# Patient Record
Sex: Male | Born: 1965 | Race: White | Hispanic: No | Smoking: Current every day smoker
Health system: Southern US, Community
[De-identification: ages and names within clinical notes are randomized; demographics above are authoritative.]

## PROBLEM LIST (undated history)

## (undated) DIAGNOSIS — G629 Polyneuropathy, unspecified: Secondary | ICD-10-CM

## (undated) DIAGNOSIS — K219 Gastro-esophageal reflux disease without esophagitis: Secondary | ICD-10-CM

## (undated) DIAGNOSIS — D649 Anemia, unspecified: Secondary | ICD-10-CM

## (undated) DIAGNOSIS — F1020 Alcohol dependence, uncomplicated: Secondary | ICD-10-CM

## (undated) DIAGNOSIS — E119 Type 2 diabetes mellitus without complications: Secondary | ICD-10-CM

## (undated) DIAGNOSIS — F419 Anxiety disorder, unspecified: Secondary | ICD-10-CM

## (undated) DIAGNOSIS — I10 Essential (primary) hypertension: Secondary | ICD-10-CM

## (undated) HISTORY — DX: Alcohol dependence, uncomplicated: F10.20

## (undated) HISTORY — DX: Essential (primary) hypertension: I10

## (undated) HISTORY — PX: ANKLE SURGERY: SHX546

## (undated) HISTORY — DX: Gastro-esophageal reflux disease without esophagitis: K21.9

## (undated) HISTORY — DX: Anxiety disorder, unspecified: F41.9

## (undated) HISTORY — DX: Anemia, unspecified: D64.9

---

## 2006-07-26 ENCOUNTER — Emergency Department: Payer: Self-pay | Admitting: Emergency Medicine

## 2012-08-04 DIAGNOSIS — W230XXA Caught, crushed, jammed, or pinched between moving objects, initial encounter: Secondary | ICD-10-CM | POA: Insufficient documentation

## 2013-03-16 DIAGNOSIS — M25329 Other instability, unspecified elbow: Secondary | ICD-10-CM | POA: Insufficient documentation

## 2017-09-04 DIAGNOSIS — Z9889 Other specified postprocedural states: Secondary | ICD-10-CM | POA: Insufficient documentation

## 2018-08-12 ENCOUNTER — Emergency Department
Admission: EM | Admit: 2018-08-12 | Discharge: 2018-08-12 | Disposition: A | Discharge status: Home / Self Care | Payer: Medicare Other | Attending: Emergency Medicine | Admitting: Emergency Medicine

## 2018-08-12 ENCOUNTER — Emergency Department: Payer: Medicare Other

## 2018-08-12 ENCOUNTER — Encounter: Payer: Self-pay | Admitting: *Deleted

## 2018-08-12 DIAGNOSIS — E119 Type 2 diabetes mellitus without complications: Secondary | ICD-10-CM | POA: Diagnosis not present

## 2018-08-12 DIAGNOSIS — R079 Chest pain, unspecified: Secondary | ICD-10-CM | POA: Insufficient documentation

## 2018-08-12 DIAGNOSIS — F17210 Nicotine dependence, cigarettes, uncomplicated: Secondary | ICD-10-CM | POA: Diagnosis not present

## 2018-08-12 HISTORY — DX: Type 2 diabetes mellitus without complications: E11.9

## 2018-08-12 HISTORY — DX: Polyneuropathy, unspecified: G62.9

## 2018-08-12 LAB — BASIC METABOLIC PANEL
Anion gap: 10 (ref 5–15)
BUN: 8 mg/dL (ref 6–20)
CHLORIDE: 97 mmol/L — AB (ref 98–111)
CO2: 27 mmol/L (ref 22–32)
Calcium: 9.6 mg/dL (ref 8.9–10.3)
Creatinine, Ser: 0.93 mg/dL (ref 0.61–1.24)
GFR calc Af Amer: >60 mL/min (ref >60)
GFR calc non Af Amer: >60 mL/min (ref >60)
Glucose, Bld: 225 mg/dL — ABNORMAL HIGH (ref 70–99)
Potassium: 4.7 mmol/L (ref 3.5–5.1)
Sodium: 134 mmol/L — ABNORMAL LOW (ref 135–145)

## 2018-08-12 LAB — CBC
HEMATOCRIT: 47.3 % (ref 39.0–52.0)
Hemoglobin: 16.1 g/dL (ref 13.0–17.0)
MCH: 33 pg (ref 26.0–34.0)
MCHC: 34 g/dL (ref 30.0–36.0)
MCV: 96.9 fL (ref 80.0–100.0)
Platelets: 223 10*3/uL (ref 150–400)
RBC: 4.88 MIL/uL (ref 4.22–5.81)
RDW: 12.8 % (ref 11.5–15.5)
WBC: 8.2 10*3/uL (ref 4.0–10.5)
nRBC: 0 % (ref 0.0–0.2)

## 2018-08-12 LAB — TROPONIN I: Troponin I: <0.03 ng/mL (ref <0.03)

## 2018-08-12 MED ORDER — CHLORDIAZEPOXIDE HCL 25 MG PO CAPS: ORAL_CAPSULE | ORAL | 0 refills | Status: DC

## 2018-08-12 MED ORDER — CHLORDIAZEPOXIDE HCL 25 MG PO CAPS
25.0000 mg | ORAL_CAPSULE | Freq: Once | ORAL | Status: AC
  Administered 2018-08-12: 25 mg via ORAL
  Filled 2018-08-12: qty 1

## 2018-08-12 MED ORDER — SODIUM CHLORIDE 0.9% FLUSH: 3.0000 mL | Freq: Once | INTRAVENOUS | Status: DC

## 2018-08-12 NOTE — ED Notes (Signed)
IV removed from Left ac prior to d/c, hemostasis obtained and, site is clean dry and intact.

## 2018-08-12 NOTE — ED Provider Notes (Signed)
[facility] Emergency Department Provider Note       Time seen: ----------------------------------------- 7:46 PM on 08/12/2018 -----------------------------------------   I have reviewed the triage vital signs and the nursing notes.  HISTORY   Chief Complaint Chest Pain    HPI Alex Green is a 53 y.o. male with a history of diabetes and neuropathy who presents to the ED for pain or shortness of breath with anxiety which has been occurring for the past 9 to 10 days.  Patient reports feeling anxious and pacing at night.  Patient states his been feeling short of breath and having chest pain and this makes him very anxious causing him to paced in his house at night.  Past Medical History:  Diagnosis Date  . Diabetes mellitus without complication (HCC)   . Neuropathy     There are no active problems to display for this patient.   History reviewed. No pertinent surgical history.  Allergies Other  Social History Social History   Tobacco Use  . Smoking status: Current Every Day Smoker    Packs/day: 1.50  . Smokeless tobacco: Never Used  Substance Use Topics  . Alcohol use: Never    Frequency: Never  . Drug use: Never   Review of Systems Constitutional: Negative for fever. Cardiovascular: Positive for chest pain Respiratory: Positive for shortness of breath Gastrointestinal: Negative for abdominal pain, vomiting and diarrhea. Musculoskeletal: Negative for back pain. Skin: Negative for rash. Neurological: Negative for headaches, focal weakness or numbness. Psychiatric: Positive for anxiety  All systems negative/normal/unremarkable except as stated in the HPI  ____________________________________________   PHYSICAL EXAM:  VITAL SIGNS: ED Triage Vitals [08/12/18 1838]  Enc Vitals Group     BP (!) 150/90     Pulse Rate 78     Resp 16     Temp 98.6 F (37 C)     Temp Source Oral     SpO2 100 %     Weight 185 lb (83.9 kg)   Height      Head Circumference      Peak Flow      Pain Score 2     Pain Loc      Pain Edu?      Excl. in GC?    Constitutional: Alert and oriented. Well appearing and in no distress. Eyes: Conjunctivae are normal. Normal extraocular movements. Cardiovascular: Normal rate, regular rhythm. No murmurs, rubs, or gallops. Respiratory: Normal respiratory effort without tachypnea nor retractions. Breath sounds are clear and equal bilaterally. No wheezes/rales/rhonchi. Gastrointestinal: Soft and nontender. Normal bowel sounds Musculoskeletal: Nontender with normal range of motion in extremities. No lower extremity tenderness nor edema. Neurologic:  Normal speech and language. No gross focal neurologic deficits are appreciated.  Mild tremors noted Skin:  Skin is warm, dry and intact. No rash noted. Psychiatric: Mood and affect are normal. Speech and behavior are normal.  ____________________________________________  EKG: Interpreted by me.  Sinus rhythm rate 87 bpm, normal PR interval, normal QRS, normal QT  ____________________________________________  ED COURSE:  As part of my medical decision making, I reviewed the following data within the electronic MEDICAL RECORD NUMBER History obtained from family if available, nursing notes, old chart and ekg, as well as notes from prior ED visits. Patient presented for nonspecific chest pain, we will assess with labs and imaging as indicated at this time.   Procedures ____________________________________________   LABS (pertinent positives/negatives)  Labs Reviewed  BASIC METABOLIC PANEL - Abnormal; Notable for the following  components:      Result Value   Sodium 134 (*)    Chloride 97 (*)    Glucose, Bld 225 (*)    All other components within normal limits  CBC  TROPONIN I    RADIOLOGY Images were viewed by me  Chest x-ray is unremarkable  ____________________________________________   DIFFERENTIAL DIAGNOSIS   Alcohol withdrawal,  dehydration, electrolyte abnormality, anxiety, unstable angina unlikely  FINAL ASSESSMENT AND PLAN  Chest pain  Plan: The patient had presented for chest pain. Patient's labs are reassuring. Patient's imaging not reveal any acute process.  Currently he is tremulous which I think is alcohol withdrawal related.  I will place him on Librium taper to discontinue alcohol.  He is cleared for outpatient follow-up.   Ulice Dash, MD    Note: This note was generated in part or whole with voice recognition software. Voice recognition is usually quite accurate but there are transcription errors that can and very often do occur. I apologize for any typographical errors that were not detected and corrected.     Emily Filbert, MD 08/12/18 2013

## 2018-08-12 NOTE — ED Notes (Addendum)
Pt verbalized understanding of d/c instructions, rx, and f/u care. No further questions at this time. Pt ambulatory to exit with steady gait. E-signature no working at this time. Paper signature obtained.

## 2018-08-12 NOTE — ED Triage Notes (Signed)
Pt is here for CP and sob and anxiety which has been occurring for 9-10 days.  Pt reports feeling anxious and pacing at night.  Pt states that he has been feeling sob and having CP and these feelings have been making him very anxious causing him to pace his house at night.

## 2018-08-12 NOTE — ED Notes (Signed)
Pt placed on cardiac monitoring. Assessment completed. MD at bedside. Call bell within reach.

## 2019-11-03 ENCOUNTER — Other Ambulatory Visit: Payer: Self-pay | Admitting: Physician Assistant

## 2019-11-03 DIAGNOSIS — S9032XA Contusion of left foot, initial encounter: Secondary | ICD-10-CM

## 2020-02-23 ENCOUNTER — Ambulatory Visit: Payer: Self-pay | Admitting: Family Medicine

## 2020-04-13 LAB — BASIC METABOLIC PANEL
BUN: 3 — AB (ref 4–21)
Creatinine: 0.7 (ref 0.6–1.3)
Glucose: 152
Potassium: 4.9 (ref 3.4–5.3)
Sodium: 134 — AB (ref 137–147)

## 2020-04-13 LAB — HEPATIC FUNCTION PANEL
ALT: 36 (ref 10–40)
AST: 53 — AB (ref 14–40)
Bilirubin, Total: 0.6

## 2020-04-13 LAB — HEMOGLOBIN A1C: Hemoglobin A1C: 7

## 2020-04-21 ENCOUNTER — Ambulatory Visit (INDEPENDENT_AMBULATORY_CARE_PROVIDER_SITE_OTHER): Payer: Self-pay | Admitting: Family Medicine

## 2020-04-21 ENCOUNTER — Encounter: Payer: Self-pay | Admitting: Family Medicine

## 2020-04-21 ENCOUNTER — Other Ambulatory Visit: Payer: Self-pay

## 2020-04-21 VITALS — BP 128/84 | HR 88 | Temp 97.5°F | Ht 69.0 in | Wt 175.0 lb

## 2020-04-21 DIAGNOSIS — E1140 Type 2 diabetes mellitus with diabetic neuropathy, unspecified: Secondary | ICD-10-CM | POA: Insufficient documentation

## 2020-04-21 DIAGNOSIS — F1021 Alcohol dependence, in remission: Secondary | ICD-10-CM | POA: Insufficient documentation

## 2020-04-21 DIAGNOSIS — F331 Major depressive disorder, recurrent, moderate: Secondary | ICD-10-CM

## 2020-04-21 DIAGNOSIS — Z720 Tobacco use: Secondary | ICD-10-CM

## 2020-04-21 DIAGNOSIS — I10 Essential (primary) hypertension: Secondary | ICD-10-CM

## 2020-04-21 DIAGNOSIS — K219 Gastro-esophageal reflux disease without esophagitis: Secondary | ICD-10-CM | POA: Insufficient documentation

## 2020-04-21 DIAGNOSIS — Z7689 Persons encountering health services in other specified circumstances: Secondary | ICD-10-CM

## 2020-04-21 DIAGNOSIS — F1020 Alcohol dependence, uncomplicated: Secondary | ICD-10-CM | POA: Insufficient documentation

## 2020-04-21 DIAGNOSIS — F411 Generalized anxiety disorder: Secondary | ICD-10-CM | POA: Insufficient documentation

## 2020-04-21 NOTE — Patient Instructions (Addendum)
Thank you for coming to the office today.  Recent Labs    04/13/20 0000  HGBA1C 7.0    We need a copy of the Diabetic Eye report.  Your provider would like to you have your annual eye exam. Please contact your current eye doctor or here are some good options for you to contact.   [facility]   Address: [address] [place], [place] [address] Phone: [phone]  Website: [email or web address]   [facility] [address], [place], [place] [address] Phone: [phone] [email or web address]  [facility]  Address: [address] [place], [place], [place] [address] Phone: [phone]   [facility] [address] [place], [place] [address] Phone: [phone]  [facility] Address: [address] [place], [place], [place] [address]  Phone: [phone]  DUE for FASTING BLOOD WORK (no food or drink after midnight before the lab appointment, only water or coffee without cream/sugar on the morning of)   Please schedule a Follow-up Appointment to: Return in about 3 months (around 07/22/2020) for 3 month follow-up DM, HTN, neuro, meds, fasting lab BEFORE appointment same day..  If you have any other questions or concerns, please feel free to call the office or send a message through MyChart. You may also schedule an earlier appointment if necessary.  Additionally, you may be receiving a survey about your experience at our office within a few days to 1 week by e-mail or mail. We value your feedback.  Saralyn Pilar, DO [facility], [place]

## 2020-04-21 NOTE — Progress Notes (Signed)
Subjective:    Patient ID: Alex Green, male    DOB: 07/25/1965, 54 y.o.   MRN: [id number]  Alex Green is a 54 y.o. male presenting on 04/21/2020 for Establish Care (DM, feet numbness)  Here to establish care. New patient. Previous PCP at [facility] [place]. Provider: Unknown [facility]oley PA  HPI   CHRONIC DM, Type 2, with neuropathy Reports history of initial dx DM >6 years ago, with A1c 12.1 in past. CBGs: Meter was working until now difficulty with it Meds: Trulicity 0.75mg  weekly - Off Metformin and Amaryl Reports good compliance. Tolerating well w/o side-effects Currently on ARB  Lifestyle: - Diet (improving) Exercise (limited due to neuropathy) - Admits chronic neuropathy - he takes Lyrica 150mg  4 times daily. He is unable to maintain balance. He has upcoming Neurological apt coming up. Admits dizziness. Eye doctor is in , but he cannot travel back, needs new local Denies hypoglycemia, polyuria, visual changes, numbness or tingling.  Major Depression, recurrent  Generalized Anxiety Disorder Insomnia Chronic problem with some mixed depression and anxiety. 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 He did sleep study.  GERD He feels choked at times. He takes Protonix once daily that helps.  CHRONIC HTN: Reports not checking BP at home. Current Meds - Valsartan (Diovan) 80mg  daily   Reports good compliance, took meds today. Tolerating well, w/o complaints. Denies CP, dyspnea, HA, edema, dizziness / lightheadedness  Muscle Atrophy / Poor Appetite.   PMH - Left elbow injury, caught in machine. He has retired.    Depression screen PHQ 2/9 04/21/2020  Decreased Interest 2  Down, Depressed, Hopeless 2  PHQ - 2 Score 4  Altered sleeping 1  Tired, decreased energy 3  Change in  appetite 3  Feeling bad or failure about yourself  1  Trouble concentrating 2  Moving slowly or fidgety/restless 0  Suicidal thoughts 0  PHQ-9 Score 14  Difficult doing work/chores Somewhat difficult     GAD 7 : Generalized Anxiety Score 04/21/2020  Nervous, Anxious, on Edge 2  Control/stop worrying 0  Worry too much - different things 0  Trouble relaxing 0  Restless 0  Easily annoyed or irritable 1  Afraid - awful might happen 1  Total GAD 7 Score 4  Anxiety Difficulty Somewhat difficult      Past Medical History:  Diagnosis Date  . Alcoholism (HCC)   . Anemia   . Anxiety   . GERD (gastroesophageal reflux disease)   . Hypertension    Past Surgical History:  Procedure Laterality Date  . ANKLE SURGERY     Social History   Socioeconomic History  . Marital status: Divorced    Spouse name: Not on file  . Number of children: Not on file  . Years of education: Not on file  . Highest education level: Not on file  Occupational History  . Not on file  Tobacco Use  . Smoking status: Current Every Day Smoker    Packs/day: 1.50    Years: 10.00    Pack years: 15.00    Types: Cigarettes  . Smokeless tobacco: Never Used  Substance and Sexual Activity  . Alcohol use: Yes    Alcohol/week: 50.0 standard drinks    Types: 50 Cans of beer per week  . Drug use: Not Currently  .  Sexual activity: Not on file  Other Topics Concern  . Not on file  Social History Narrative  . Not on file   Social Determinants of Health   Financial Resource Strain:   . Difficulty of Paying Living Expenses: Not on file  Food Insecurity:   . Worried About Programme researcher, broadcasting/film/video in the Last Year: Not on file  . Ran Out of Food in the Last Year: Not on file  Transportation Needs:   . Lack of Transportation (Medical): Not on file  . Lack of Transportation (Non-Medical): Not on file  Physical Activity:   . Days of Exercise per Week: Not on file  . Minutes of Exercise per Session: Not on file   Stress:   . Feeling of Stress : Not on file  Social Connections:   . Frequency of Communication with Friends and Family: Not on file  . Frequency of Social Gatherings with Friends and Family: Not on file  . Attends Religious Services: Not on file  . Active Member of Clubs or Organizations: Not on file  . Attends Banker Meetings: Not on file  . Marital Status: Not on file  Intimate Partner Violence:   . Fear of Current or Ex-Partner: Not on file  . Emotionally Abused: Not on file  . Physically Abused: Not on file  . Sexually Abused: Not on file   History reviewed. No pertinent family history. Current Outpatient Medications on File Prior to Visit  Medication Sig  . Dulaglutide (TRULICITY) 0.75 MG/0.5ML SOPN Inject into the skin.  . DULoxetine (CYMBALTA) 60 MG capsule Take 60 mg by mouth daily.  . pregabalin (LYRICA) 150 MG capsule Take 150 mg by mouth in the morning, at noon, in the evening, and at bedtime.  . valsartan (DIOVAN) 80 MG tablet Take 80 mg by mouth daily.  Marland Kitchen zolpidem (AMBIEN CR) 12.5 MG CR tablet Take 12.5 mg by mouth at bedtime as needed for sleep.   No current facility-administered medications on file prior to visit.    Review of Systems Per HPI unless specifically indicated above      Objective:    BP 128/84 (BP Location: Left Arm, Cuff Size: Normal)   Pulse 88   Temp (!) 97.5 F (36.4 C) (Temporal)   Ht 5\' 9"  (1.753 m)   Wt 175 lb (79.4 kg)   SpO2 100%   BMI 25.84 kg/m   Wt Readings from Last 3 Encounters:  04/21/20 175 lb (79.4 kg)    Physical Exam Vitals and nursing note reviewed.  Constitutional:      General: He is not in acute distress.    Appearance: He is well-developed. He is obese. He is not diaphoretic.     Comments: Well-appearing, comfortable, cooperative  HENT:     Head: Normocephalic and atraumatic.  Eyes:     General:        Right eye: No discharge.        Left eye: No discharge.     Conjunctiva/sclera:  Conjunctivae normal.  Cardiovascular:     Rate and Rhythm: Normal rate.  Pulmonary:     Effort: Pulmonary effort is normal.  Skin:    General: Skin is warm and dry.     Findings: No erythema or rash.  Neurological:     Mental Status: He is alert and oriented to person, place, and time.  Psychiatric:        Behavior: Behavior normal.     Comments: Well groomed, good  eye contact, normal speech and thoughts       Results for orders placed or performed in visit on 04/21/20  Basic metabolic panel  Result Value Ref Range   Glucose 152    BUN 3 (A) 4 - 21   Creatinine 0.7 0.6 - 1.3   Potassium 4.9 3.4 - 5.3   Sodium 134 (A) 137 - 147  Hepatic function panel  Result Value Ref Range   ALT 36 10 - 40   AST 53 (A) 14 - 40   Bilirubin, Total 0.6   Hemoglobin A1c  Result Value Ref Range   Hemoglobin A1C 7.0       Assessment & Plan:   Problem List Items Addressed This Visit    Tobacco abuse   GERD (gastroesophageal reflux disease)   GAD (generalized anxiety disorder)    Controlled on Duloxetine      Relevant Medications   DULoxetine (CYMBALTA) 60 MG capsule   Essential hypertension    Well-controlled HTN - Home BP readings     Plan:  1. Continue current BP regimen Valsartan 80mg  daily 2. Encourage improved lifestyle - low sodium diet, regular exercise 3. Start monitor BP outside office, bring readings to next visit, if persistently >140/90 or new symptoms notify office sooner      Relevant Medications   valsartan (DIOVAN) 80 MG tablet   Controlled type 2 diabetes mellitus with neuropathy (HCC) - Primary    Well-controlled DM with A1c 7 on last lab Complications - peripheral neuropathy, hyperlipidemia, GERD, depression, obesity-  increases risk of future cardiovascular complications   Plan:  1. Continue current therapy - Trulicity 0.75mg  weekly, 2. Encourage improved lifestyle - low carb, low sugar diet, reduce portion size, continue improving regular exercise 3.  Check CBG , bring log to next visit for review 4. Continue ARB, future consider statin 5. DM Foot exam done today . Advised to schedule DM ophtho exam, send record       Relevant Medications   valsartan (DIOVAN) 80 MG tablet   Dulaglutide (TRULICITY) 0.75 MG/0.5ML SOPN   Alcohol dependence (HCC)    Stable chronic problem without symptoms of withdrawal Never history of complicated withdrawal or hospitalization Counseling on cessation       Other Visit Diagnoses    Major depressive disorder, recurrent, moderate (HCC)       Relevant Medications   DULoxetine (CYMBALTA) 60 MG capsule   Encounter to establish care with new doctor          No orders of the defined types were placed in this encounter.    Follow up plan: Return in about 3 months (around 07/22/2020) for 3 month follow-up DM, HTN, neuro, meds, fasting lab BEFORE appointment same day..   07/31/20 labs ordered before visit  08/02/20, DO [facility] 04/21/2020, 3:22 PM

## 2020-04-22 ENCOUNTER — Other Ambulatory Visit: Payer: Self-pay | Admitting: Family Medicine

## 2020-04-22 DIAGNOSIS — I10 Essential (primary) hypertension: Secondary | ICD-10-CM

## 2020-04-22 DIAGNOSIS — E1140 Type 2 diabetes mellitus with diabetic neuropathy, unspecified: Secondary | ICD-10-CM

## 2020-04-22 DIAGNOSIS — F331 Major depressive disorder, recurrent, moderate: Secondary | ICD-10-CM

## 2020-04-22 NOTE — Assessment & Plan Note (Signed)
Well-controlled HTN - Home BP readings     Plan:  1. Continue current BP regimen Valsartan 80mg  daily 2. Encourage improved lifestyle - low sodium diet, regular exercise 3. Start monitor BP outside office, bring readings to next visit, if persistently >140/90 or new symptoms notify office sooner

## 2020-04-22 NOTE — Assessment & Plan Note (Signed)
Stable chronic problem without symptoms of withdrawal Never history of complicated withdrawal or hospitalization Counseling on cessation

## 2020-04-22 NOTE — Assessment & Plan Note (Signed)
Controlled on Duloxetine 

## 2020-04-22 NOTE — Assessment & Plan Note (Signed)
Well-controlled DM with A1c 7 on last lab Complications - peripheral neuropathy, hyperlipidemia, GERD, depression, obesity-  increases risk of future cardiovascular complications   Plan:  1. Continue current therapy - Trulicity 0.75mg  weekly, 2. Encourage improved lifestyle - low carb, low sugar diet, reduce portion size, continue improving regular exercise 3. Check CBG , bring log to next visit for review 4. Continue ARB, future consider statin 5. DM Foot exam done today . Advised to schedule DM ophtho exam, send record

## 2020-06-24 DIAGNOSIS — E1142 Type 2 diabetes mellitus with diabetic polyneuropathy: Secondary | ICD-10-CM | POA: Insufficient documentation

## 2020-07-31 ENCOUNTER — Encounter: Payer: Self-pay | Admitting: Family Medicine

## 2020-07-31 ENCOUNTER — Other Ambulatory Visit: Payer: Self-pay

## 2020-07-31 ENCOUNTER — Ambulatory Visit (INDEPENDENT_AMBULATORY_CARE_PROVIDER_SITE_OTHER): Payer: Medicare Other | Admitting: Family Medicine

## 2020-07-31 VITALS — BP 80/54 | HR 101 | Ht 68.0 in | Wt 176.0 lb

## 2020-07-31 DIAGNOSIS — E1140 Type 2 diabetes mellitus with diabetic neuropathy, unspecified: Secondary | ICD-10-CM

## 2020-07-31 DIAGNOSIS — F411 Generalized anxiety disorder: Secondary | ICD-10-CM | POA: Diagnosis not present

## 2020-07-31 DIAGNOSIS — E785 Hyperlipidemia, unspecified: Secondary | ICD-10-CM | POA: Insufficient documentation

## 2020-07-31 DIAGNOSIS — E1169 Type 2 diabetes mellitus with other specified complication: Secondary | ICD-10-CM

## 2020-07-31 DIAGNOSIS — I10 Essential (primary) hypertension: Secondary | ICD-10-CM | POA: Diagnosis not present

## 2020-07-31 DIAGNOSIS — M6281 Muscle weakness (generalized): Secondary | ICD-10-CM | POA: Insufficient documentation

## 2020-07-31 MED ORDER — TRULICITY 1.5 MG/0.5ML ~~LOC~~ SOAJ: 1.5000 mg | SUBCUTANEOUS | 0 refills | Status: DC

## 2020-07-31 MED ORDER — DULOXETINE HCL 60 MG PO CPEP: 60.0000 mg | ORAL_CAPSULE | Freq: Every day | ORAL | 1 refills | Status: DC

## 2020-07-31 MED ORDER — ZOLPIDEM TARTRATE ER 12.5 MG PO TBCR: 12.5000 mg | EXTENDED_RELEASE_TABLET | Freq: Every evening | ORAL | 1 refills | Status: DC | PRN

## 2020-07-31 MED ORDER — PREGABALIN 150 MG PO CAPS: 150.0000 mg | ORAL_CAPSULE | Freq: Four times a day (QID) | ORAL | 1 refills | Status: DC

## 2020-07-31 MED ORDER — VALSARTAN 80 MG PO TABS: 80.0000 mg | ORAL_TABLET | Freq: Every day | ORAL | 1 refills | Status: DC

## 2020-07-31 NOTE — Patient Instructions (Addendum)
Thank you for coming to the office today.  Trial on higher dose Trulicity 1.5 mg weekly - 2 sample pens, call us when ready for me to order this through [facility].  Your provider would like to you have your annual eye exam. Please contact your current eye doctor or here are some good options for you to contact.   [facility]   Address: [address] [place], [place] [address] Phone: [phone]  Website: [email or web address]   [facility] [address] [place], [place] [address] Phone: [phone] [email or web address]  [facility]  Address: [address] [place], [place], [place] [address] Phone: [phone]   [facility] [address] [place], [place] [address] Phone: [phone]  [facility] Address: [address] [place], [place], [place] [address]  Phone: [phone]  Please schedule a Follow-up Appointment to: Return in about 6 months (around 01/28/2021) for 6 month DM A1c med refills.  If you have any other questions or concerns, please feel free to call the office or send a message through MyChart. You may also schedule an earlier appointment if necessary.  Additionally, you may be receiving a survey about your experience at our office within a few days to 1 week by e-mail or mail. We value your feedback.  Saralyn Pilar, DO [facility], [place]

## 2020-07-31 NOTE — Assessment & Plan Note (Signed)
Well-controlled DM previous, need updated A1c today on lab Complications - peripheral neuropathy, hyperlipidemia, GERD, depression, obesity-  increases risk of future cardiovascular complications   Plan:  1. TRIAL on free sample higher dose Trulicity 1.5mg  weekly x 2 pen = 2 week dosage, he can notify us when / if ready to pursue 1.5mg  dosing or if we will continue 0.75mg  - will need new rx order for either dose when ready 2. Encourage improved lifestyle - low carb, low sugar diet, reduce portion size, continue improving regular exercise 3. Check CBG , bring log to next visit for review 4. Continue ARB, future consider statin 5. DM Foot exam done today . Advised to schedule DM ophtho exam, send record

## 2020-07-31 NOTE — Assessment & Plan Note (Signed)
Well-controlled HTN - mild low BP today, asymptomatic - Home BP readings      Plan:  1. Continue current BP regimen Valsartan 80mg  daily 2. Encourage improved lifestyle - low sodium diet, regular exercise 3. Again advised to start monitor BP outside office, bring readings to next visit, if persistently >140/90 or new symptoms notify office sooner

## 2020-07-31 NOTE — Assessment & Plan Note (Signed)
Check fasting lipid panel today. 

## 2020-07-31 NOTE — Assessment & Plan Note (Signed)
Chronic problem Also issues with balance, complication as well with neuropathy  Referral to [facility] handwritten order given today

## 2020-07-31 NOTE — Progress Notes (Signed)
Subjective:    Patient ID: Alex Green, male    DOB: 03/12/1966, 55 y.o.   MRN: [id number]  Alex Green is a 55 y.o. male presenting on 07/31/2020 for Diabetes, Hypertension, and Balance issues   HPI  CHRONIC DM, Type 2, with neuropathy Reports history of initial dx DM >6 years ago, with A1c 12.1 in past. Now readings improved, awaiting on A1c result from today now. CBGs: Has glucometer again, no detailed report today. Meds: Trulicity 0.75mg  weekly, has not tried higher dose 1.5 - Off Metformin and Amaryl Reports good compliance. Tolerating well w/o side-effects Currently on ARB  Lifestyle: - Diet (improving) Exercise (limited due to neuropathy) - Admits chronic neuropathy - he takes Lyrica 150mg  4 times daily. He is unable to maintain balance. He has upcoming Neurological apt coming up. Admits dizziness. Eye doctor is in , but he cannot travel back, needs new local Denies hypoglycemia, polyuria, visual changes, numbness or tingling.  Major Depression, recurrent  Generalized Anxiety Disorder Insomnia Chronic problem with some mixed depression and anxiety. 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 He did sleep study. Needs refills  Generalized weakness See above with neuropathy Prior neurologist gave him order for Physical Therapy but he did not proceed, needs new order.  CHRONIC HTN: Reports not checking BP at home. Current Meds - Valsartan (Diovan) 80mg  daily   Reports good compliance, took meds today. Tolerating well, w/o complaints. Denies CP, dyspnea, HA, edema, dizziness / lightheadedness    Depression screen [facility] 2/9 07/31/2020 04/21/2020  Decreased Interest 1 2  Down, Depressed, Hopeless 1 2  PHQ - 2 Score 2 4  Altered sleeping 1 1  Tired, decreased energy 3 3  Change in appetite 1 3   Feeling bad or failure about yourself  1 1  Trouble concentrating 1 2  Moving slowly or fidgety/restless 0 0  Suicidal thoughts 0 0  PHQ-9 Score 9 14  Difficult doing work/chores Somewhat difficult Somewhat difficult   GAD 7 : Generalized Anxiety Score 04/21/2020  Nervous, Anxious, on Edge 2  Control/stop worrying 0  Worry too much - different things 0  Trouble relaxing 0  Restless 0  Easily annoyed or irritable 1  Afraid - awful might happen 1  Total GAD 7 Score 4  Anxiety Difficulty Somewhat difficult     Social History   Tobacco Use  . Smoking status: Current Every Day Smoker    Packs/day: 1.50    Years: 10.00    Pack years: 15.00    Types: Cigarettes  . Smokeless tobacco: Never Used  Substance Use Topics  . Alcohol use: Yes    Alcohol/week: 50.0 standard drinks    Types: 50 Cans of beer per week  . Drug use: Not Currently    Review of Systems Per HPI unless specifically indicated above     Objective:    BP (!) 80/54   Pulse (!) 101   Ht 5\' 8"  (1.727 m)   Wt 176 lb (79.8 kg)   SpO2 99%   BMI 26.76 kg/m   Wt Readings from Last 3 Encounters:  07/31/20 176 lb (79.8 kg)  04/21/20 175 lb (79.4 kg)    Physical Exam Vitals and nursing note reviewed.  Constitutional:      General: He is not in acute distress.    Appearance: He is  well-developed and well-nourished. He is obese. He is not diaphoretic.     Comments: Well-appearing, comfortable, cooperative  HENT:     Head: Normocephalic and atraumatic.     Mouth/Throat:     Mouth: Oropharynx is clear and moist.  Eyes:     General:        Right eye: No discharge.        Left eye: No discharge.     Conjunctiva/sclera: Conjunctivae normal.  Cardiovascular:     Rate and Rhythm: Normal rate.  Pulmonary:     Effort: Pulmonary effort is normal.  Musculoskeletal:        General: No edema.  Skin:    General: Skin is warm and dry.     Findings: No erythema or rash.  Neurological:     Mental Status: He is  alert and oriented to person, place, and time.  Psychiatric:        Mood and Affect: Mood and affect normal.        Behavior: Behavior normal.     Comments: Well groomed, good eye contact, normal speech and thoughts      Diabetic Foot Exam - Simple   Simple Foot Form Diabetic Foot exam was performed with the following findings: Yes 07/31/2020  1:16 PM  Visual Inspection See comments: Yes Sensation Testing Intact to touch and monofilament testing bilaterally: Yes Pulse Check Posterior Tibialis and Dorsalis pulse intact bilaterally: Yes Comments Extensive dry flaky skin dermatitis bilateral feet, some overgrown toenails. No ulceration. Some areas of callus formation heels and forefoot great toe.      Results for orders placed or performed in visit on 04/21/20  Basic metabolic panel  Result Value Ref Range   Glucose 152    BUN 3 (A) 4 - 21   Creatinine 0.7 0.6 - 1.3   Potassium 4.9 3.4 - 5.3   Sodium 134 (A) 137 - 147  Hepatic function panel  Result Value Ref Range   ALT 36 10 - 40   AST 53 (A) 14 - 40   Bilirubin, Total 0.6   Hemoglobin A1c  Result Value Ref Range   Hemoglobin A1C 7.0       Assessment & Plan:   Problem List Items Addressed This Visit    Muscle weakness (generalized)    Chronic problem Also issues with balance, complication as well with neuropathy  Referral to [facility] handwritten order given today      Hyperlipidemia associated with type 2 diabetes mellitus (HCC)    Check fasting lipid panel today      Relevant Medications   TRULICITY 1.5 MG/0.5ML SOPN   valsartan (DIOVAN) 80 MG tablet   Other Relevant Orders   Lipid panel   GAD (generalized anxiety disorder)    Controlled on Duloxetine      Relevant Medications   DULoxetine (CYMBALTA) 60 MG capsule   zolpidem (AMBIEN CR) 12.5 MG CR tablet   Essential hypertension    Well-controlled HTN - mild low BP today, asymptomatic - Home BP readings      Plan:  1. Continue current BP  regimen Valsartan 80mg  daily 2. Encourage improved lifestyle - low sodium diet, regular exercise 3. Again advised to start monitor BP outside office, bring readings to next visit, if persistently >140/90 or new symptoms notify office sooner      Relevant Medications   valsartan (DIOVAN) 80 MG tablet   Other Relevant Orders   CBC with Differential/Platelet   COMPLETE METABOLIC PANEL  WITH GFR   Controlled type 2 diabetes mellitus with neuropathy (HCC) - Primary    Well-controlled DM previous, need updated A1c today on lab Complications - peripheral neuropathy, hyperlipidemia, GERD, depression, obesity-  increases risk of future cardiovascular complications   Plan:  1. TRIAL on free sample higher dose Trulicity 1.5mg  weekly x 2 pen = 2 week dosage, he can notify us when / if ready to pursue 1.5mg  dosing or if we will continue 0.75mg  - will need new rx order for either dose when ready 2. Encourage improved lifestyle - low carb, low sugar diet, reduce portion size, continue improving regular exercise 3. Check CBG , bring log to next visit for review 4. Continue ARB, future consider statin 5. DM Foot exam done today . Advised to schedule DM ophtho exam, send record       Relevant Medications   TRULICITY 1.5 MG/0.5ML SOPN   pregabalin (LYRICA) 150 MG capsule   valsartan (DIOVAN) 80 MG tablet   Other Relevant Orders   Hemoglobin A1c   Lipid panel      Orders Placed This Encounter  Procedures  . Hemoglobin A1c  . CBC with Differential/Platelet  . COMPLETE METABOLIC PANEL WITH GFR  . Lipid panel    Order Specific Question:   Has the patient fasted?    Answer:   Yes    Meds ordered this encounter  Medications  . TRULICITY 1.5 MG/0.5ML SOPN    Sig: Inject 1.5 mg into the skin once a week.    Dispense:  1 mL    Refill:  0  . DULoxetine (CYMBALTA) 60 MG capsule    Sig: Take 1 capsule (60 mg total) by mouth daily.    Dispense:  90 capsule    Refill:  1  . pregabalin (LYRICA) 150  MG capsule    Sig: Take 1 capsule (150 mg total) by mouth in the morning, at noon, in the evening, and at bedtime.    Dispense:  360 capsule    Refill:  1  . valsartan (DIOVAN) 80 MG tablet    Sig: Take 1 tablet (80 mg total) by mouth daily.    Dispense:  90 tablet    Refill:  1  . zolpidem (AMBIEN CR) 12.5 MG CR tablet    Sig: Take 1 tablet (12.5 mg total) by mouth at bedtime as needed for sleep.    Dispense:  90 tablet    Refill:  1      Follow up plan: Return in about 6 months (around 01/28/2021) for 6 month DM A1c med refills.  Saralyn Pilar, DO [facility] 07/31/2020, 11:00 AM

## 2020-07-31 NOTE — Assessment & Plan Note (Signed)
Controlled on Duloxetine

## 2020-08-01 LAB — CBC WITH DIFFERENTIAL/PLATELET
Absolute Monocytes: 684 cells/uL (ref 200–950)
Basophils Absolute: 57 cells/uL (ref 0–200)
Basophils Relative: 0.6 %
Eosinophils Absolute: 181 cells/uL (ref 15–500)
Eosinophils Relative: 1.9 %
HCT: 43.7 % (ref 38.5–50.0)
Hemoglobin: 15.5 g/dL (ref 13.2–17.1)
Lymphs Abs: 1644 cells/uL (ref 850–3900)
MCH: 34.6 pg — ABNORMAL HIGH (ref 27.0–33.0)
MCHC: 35.5 g/dL (ref 32.0–36.0)
MCV: 97.5 fL (ref 80.0–100.0)
MPV: 10.7 fL (ref 7.5–12.5)
Monocytes Relative: 7.2 %
Neutro Abs: 6935 cells/uL (ref 1500–7800)
Neutrophils Relative %: 73 %
Platelets: 264 10*3/uL (ref 140–400)
RBC: 4.48 10*6/uL (ref 4.20–5.80)
RDW: 12.1 % (ref 11.0–15.0)
Total Lymphocyte: 17.3 %
WBC: 9.5 10*3/uL (ref 3.8–10.8)

## 2020-08-01 LAB — COMPLETE METABOLIC PANEL WITH GFR
AG Ratio: 1.3 (calc) (ref 1.0–2.5)
ALT: 30 U/L (ref 9–46)
AST: 43 U/L — ABNORMAL HIGH (ref 10–35)
Albumin: 3.7 g/dL (ref 3.6–5.1)
Alkaline phosphatase (APISO): 108 U/L (ref 35–144)
BUN/Creatinine Ratio: 5 (calc) — ABNORMAL LOW (ref 6–22)
BUN: 4 mg/dL — ABNORMAL LOW (ref 7–25)
CO2: 26 mmol/L (ref 20–32)
Calcium: 8.7 mg/dL (ref 8.6–10.3)
Chloride: 95 mmol/L — ABNORMAL LOW (ref 98–110)
Creat: 0.86 mg/dL (ref 0.70–1.33)
GFR, Est African American: 112 mL/min/{1.73_m2} (ref >=60)
GFR, Est Non African American: 97 mL/min/{1.73_m2} (ref >=60)
Globulin: 2.9 g/dL (calc) (ref 1.9–3.7)
Glucose, Bld: 119 mg/dL — ABNORMAL HIGH (ref 65–99)
Potassium: 4.6 mmol/L (ref 3.5–5.3)
Sodium: 133 mmol/L — ABNORMAL LOW (ref 135–146)
Total Bilirubin: 0.5 mg/dL (ref 0.2–1.2)
Total Protein: 6.6 g/dL (ref 6.1–8.1)

## 2020-08-01 LAB — LIPID PANEL
Cholesterol: 140 mg/dL (ref <200)
HDL: 49 mg/dL (ref >=40)
LDL Cholesterol (Calc): 75 mg/dL (calc)
Non-HDL Cholesterol (Calc): 91 mg/dL (calc) (ref <130)
Total CHOL/HDL Ratio: 2.9 (calc) (ref <5.0)
Triglycerides: 76 mg/dL (ref <150)

## 2020-08-01 LAB — HEMOGLOBIN A1C
Hgb A1c MFr Bld: 5.9 % of total Hgb — ABNORMAL HIGH (ref <5.7)
Mean Plasma Glucose: 123 mg/dL
eAG (mmol/L): 6.8 mmol/L

## 2020-08-08 ENCOUNTER — Other Ambulatory Visit: Payer: Self-pay | Admitting: Family Medicine

## 2020-08-08 DIAGNOSIS — E1140 Type 2 diabetes mellitus with diabetic neuropathy, unspecified: Secondary | ICD-10-CM

## 2020-08-08 MED ORDER — TRULICITY 1.5 MG/0.5ML ~~LOC~~ SOAJ: 1.5000 mg | SUBCUTANEOUS | 1 refills | Status: DC

## 2020-09-06 ENCOUNTER — Emergency Department: Payer: Medicare Other

## 2020-09-06 ENCOUNTER — Emergency Department
Admission: EM | Admit: 2020-09-06 | Discharge: 2020-09-06 | Disposition: A | Discharge status: Home / Self Care | Payer: Medicare Other | Attending: Emergency Medicine | Admitting: Emergency Medicine

## 2020-09-06 ENCOUNTER — Other Ambulatory Visit: Payer: Self-pay

## 2020-09-06 DIAGNOSIS — S0101XA Laceration without foreign body of scalp, initial encounter: Secondary | ICD-10-CM | POA: Insufficient documentation

## 2020-09-06 DIAGNOSIS — I10 Essential (primary) hypertension: Secondary | ICD-10-CM | POA: Diagnosis not present

## 2020-09-06 DIAGNOSIS — S065X9A Traumatic subdural hemorrhage with loss of consciousness of unspecified duration, initial encounter: Secondary | ICD-10-CM

## 2020-09-06 DIAGNOSIS — W0110XA Fall on same level from slipping, tripping and stumbling with subsequent striking against unspecified object, initial encounter: Secondary | ICD-10-CM | POA: Diagnosis not present

## 2020-09-06 DIAGNOSIS — S0990XA Unspecified injury of head, initial encounter: Secondary | ICD-10-CM | POA: Diagnosis present

## 2020-09-06 DIAGNOSIS — E1140 Type 2 diabetes mellitus with diabetic neuropathy, unspecified: Secondary | ICD-10-CM | POA: Diagnosis not present

## 2020-09-06 DIAGNOSIS — S065X0A Traumatic subdural hemorrhage without loss of consciousness, initial encounter: Secondary | ICD-10-CM | POA: Insufficient documentation

## 2020-09-06 DIAGNOSIS — S065XAA Traumatic subdural hemorrhage with loss of consciousness status unknown, initial encounter: Secondary | ICD-10-CM

## 2020-09-06 DIAGNOSIS — F17210 Nicotine dependence, cigarettes, uncomplicated: Secondary | ICD-10-CM | POA: Insufficient documentation

## 2020-09-06 MED ORDER — LEVETIRACETAM 500 MG PO TABS: 500.0000 mg | ORAL_TABLET | Freq: Two times a day (BID) | ORAL | 0 refills | Status: AC

## 2020-09-06 NOTE — ED Notes (Signed)
See triage note   States he fell about 2 am  Hitting head States he tripped  And also he was drinking ETOH

## 2020-09-06 NOTE — ED Triage Notes (Signed)
See first nurse note. Per pt, he was drinking and he tripped and hit his head on the floor. Lac to top of the head. Denies pain at this time.

## 2020-09-06 NOTE — ED Notes (Signed)
Pt moved to major 1 hall  Report called to [employer]

## 2020-09-06 NOTE — ED Provider Notes (Addendum)
[facility] Emergency Department Provider Note   ____________________________________________   Event Date/Time   First MD Initiated Contact with Patient 09/06/20 1105     (approximate)  I have reviewed the triage vital signs and the nursing notes.   HISTORY  Chief Complaint Fall    HPI Alex Green is a 55 y.o. male patient presents for scalp laceration.  Patient he was drinking last night he tripped and hit his head on the floor.  Patient unsure of LOC.         Past Medical History:  Diagnosis Date  . Alcoholism (HCC)   . Anemia   . Anxiety   . GERD (gastroesophageal reflux disease)   . Hypertension   . Neuropathy     Patient Active Problem List   Diagnosis Date Noted  . Hyperlipidemia associated with type 2 diabetes mellitus (HCC) 07/31/2020  . Muscle weakness (generalized) 07/31/2020  . Controlled type 2 diabetes mellitus with neuropathy (HCC) 04/21/2020  . Essential hypertension 04/21/2020  . GAD (generalized anxiety disorder) 04/21/2020  . GERD (gastroesophageal reflux disease) 04/21/2020  . Tobacco abuse 04/21/2020  . Alcohol dependence (HCC) 04/21/2020    Past Surgical History:  Procedure Laterality Date  . ANKLE SURGERY      Prior to Admission medications   Medication Sig Start Date End Date Taking? Authorizing Provider  chlordiazePOXIDE (LIBRIUM) 25 MG capsule Take 1 tablet by mouth 5 times a day on day 1, then decrease by 1 tablet daily until gone. 08/12/18   Emily Filbert, MD  DULoxetine (CYMBALTA) 60 MG capsule Take 1 capsule (60 mg total) by mouth daily. 07/31/20   [name], [name], DO  pregabalin (LYRICA) 150 MG capsule Take 1 capsule (150 mg total) by mouth in the morning, at noon, in the evening, and at bedtime. 07/31/20   [name], [name], DO  TRULICITY 1.5 MG/0.5ML SOPN Inject 1.5 mg into the skin once a week. 08/08/20   [name], [name], DO  valsartan (DIOVAN) 80 MG tablet Take 1  tablet (80 mg total) by mouth daily. 07/31/20   [name], [name], DO  zolpidem (AMBIEN CR) 12.5 MG CR tablet Take 1 tablet (12.5 mg total) by mouth at bedtime as needed for sleep. 07/31/20   [name], DO    Allergies Other  History reviewed. No pertinent family history.  Social History Social History   Tobacco Use  . Smoking status: Current Every Day Smoker    Packs/day: 1.50    Years: 10.00    Pack years: 15.00    Types: Cigarettes  . Smokeless tobacco: Never Used  Substance Use Topics  . Alcohol use: Yes    Alcohol/week: 50.0 standard drinks    Types: 50 Cans of beer per week  . Drug use: Not Currently    Review of Systems Constitutional: No fever/chills Eyes: No visual changes. ENT: No sore throat. Cardiovascular: Denies chest pain. Respiratory: Denies shortness of breath. Gastrointestinal: No abdominal pain.  No nausea, no vomiting.  No diarrhea.  No constipation. Genitourinary: Negative for dysuria. Musculoskeletal: Negative for back pain. Skin: Negative for rash. Neurological: Negative for headaches, focal weakness or numbness. Endocrine:  Diabetes, hyperlipidemia, and hypertension. PHYSICAL EXAM:  VITAL SIGNS: ED Triage Vitals  Enc Vitals Group     BP 09/06/20 1029 136/84     Pulse Rate 09/06/20 1029 85     Resp 09/06/20 1029 18     Temp 09/06/20 1029 98.3 F (36.8 C)  Temp Source 09/06/20 1029 Oral     SpO2 09/06/20 1029 99 %     Weight 09/06/20 1028 180 lb (81.6 kg)     Height 09/06/20 1028 5\' 8"  (1.727 m)     Head Circumference --      Peak Flow --      Pain Score 09/06/20 1028 0     Pain Loc --      Pain Edu? --      Excl. in GC? --     Constitutional: Alert and oriented. Well appearing and in no acute distress. Eyes: Conjunctivae are normal. PERRL. EOMI. Head: Laceration vertex skull.  Nose: No congestion/rhinnorhea. Mouth/Throat: Mucous membranes are moist.  Oropharynx non-erythematous. Neck:   No cervical spine  tenderness to palpation. Hematological/Lymphatic/Immunilogical: No cervical lymphadenopathy. Cardiovascular: Normal rate, regular rhythm. Grossly normal heart sounds.  Good peripheral circulation. Respiratory: Normal respiratory effort.  No retractions. Lungs CTAB. Gastrointestinal: Soft and nontender. No distention. No abdominal bruits. No CVA tenderness. Musculoskeletal: No lower extremity tenderness nor edema.  No joint effusions. Neurologic:  Normal speech and language. No gross focal neurologic deficits are appreciated. No gait instability. Skin:    3.5 cm laceration vertex of skull Psychiatric: Mood and affect are normal. Speech and behavior are normal.  ____________________________________________   LABS (all labs ordered are listed, but only abnormal results are displayed)  Labs Reviewed  CBG MONITORING, ED   ____________________________________________  EKG   ____________________________________________  RADIOLOGY I, 09/08/20, personally viewed and evaluated these images (plain radiographs) as part of my medical decision making, as well as reviewing the written report by the radiologist.  ED MD interpretation:    Official radiology report(s): CT Head Wo Contrast  Result Date: 09/06/2020 CLINICAL DATA:  Recent fall with headaches, initial encounter EXAM: CT HEAD WITHOUT CONTRAST CT CERVICAL SPINE WITHOUT CONTRAST TECHNIQUE: Multidetector CT imaging of the head and cervical spine was performed following the standard protocol without intravenous contrast. Multiplanar CT image reconstructions of the cervical spine were also generated. COMPARISON:  None. FINDINGS: CT HEAD FINDINGS Brain: Small subdural hematoma is noted on the right near the vertex in the posterior parietal region. This measures approximately 4 mm and shows no significant mass effect due to mild underlying atrophy. No acute infarct or space-occupying mass lesion are seen. Vascular: No hyperdense vessel or  unexpected calcification. Skull: Normal. Negative for fracture or focal lesion. Sinuses/Orbits: No acute finding. Other: Air is noted in the scalp on the right related to the recent injury this is in the general region of the recent injury. CT CERVICAL SPINE FINDINGS Alignment: Within normal limits. Skull base and vertebrae: 7 cervical segments are well visualized. Vertebral body height is well maintained. Disc space narrowing is noted at C5-6 and C6-7 with mild associated osteophytic changes. Mild facet hypertrophic changes are seen. No acute fracture or acute facet abnormality is noted. Soft tissues and spinal canal: Surrounding soft tissue structures are within normal limits. Upper chest: Visualized lung apices are within normal limits. Other: None IMPRESSION: CT of the head: Small 4 mm thick right subdural hematoma along the vertex in the posterior parietal region. Mild atrophic changes. CT of the cervical spine: Degenerative change without acute abnormality. Critical Value/emergent results were called by telephone at the time of interpretation on 09/06/2020 at 11:25 am to Dr. 09/08/2020 , who verbally acknowledged these results. Electronically Signed   By: Dionne Bucy M.D.   On: 09/06/2020 11:22   CT Cervical Spine Wo Contrast  Result Date: 09/06/2020 CLINICAL DATA:  Recent fall with headaches, initial encounter EXAM: CT HEAD WITHOUT CONTRAST CT CERVICAL SPINE WITHOUT CONTRAST TECHNIQUE: Multidetector CT imaging of the head and cervical spine was performed following the standard protocol without intravenous contrast. Multiplanar CT image reconstructions of the cervical spine were also generated. COMPARISON:  None. FINDINGS: CT HEAD FINDINGS Brain: Small subdural hematoma is noted on the right near the vertex in the posterior parietal region. This measures approximately 4 mm and shows no significant mass effect due to mild underlying atrophy. No acute infarct or space-occupying mass lesion are seen.  Vascular: No hyperdense vessel or unexpected calcification. Skull: Normal. Negative for fracture or focal lesion. Sinuses/Orbits: No acute finding. Other: Air is noted in the scalp on the right related to the recent injury this is in the general region of the recent injury. CT CERVICAL SPINE FINDINGS Alignment: Within normal limits. Skull base and vertebrae: 7 cervical segments are well visualized. Vertebral body height is well maintained. Disc space narrowing is noted at C5-6 and C6-7 with mild associated osteophytic changes. Mild facet hypertrophic changes are seen. No acute fracture or acute facet abnormality is noted. Soft tissues and spinal canal: Surrounding soft tissue structures are within normal limits. Upper chest: Visualized lung apices are within normal limits. Other: None IMPRESSION: CT of the head: Small 4 mm thick right subdural hematoma along the vertex in the posterior parietal region. Mild atrophic changes. CT of the cervical spine: Degenerative change without acute abnormality. Critical Value/emergent results were called by telephone at the time of interpretation on 09/06/2020 at 11:25 am to Dr. Dionne BucySEBASTIAN SIADECKI , who verbally acknowledged these results. Electronically Signed   By: Alcide CleverMark  Lukens M.D.   On: 09/06/2020 11:22    ____________________________________________   PROCEDURES  Procedure(s) performed (including Critical Care):  Marland Kitchen.Marland Kitchen.Laceration Repair  Date/Time: 09/06/2020 11:53 AM Performed by: Joni ReiningSmith, Ronald K, PA-C Authorized by: Joni ReiningSmith, Ronald K, PA-C   Consent:    Consent obtained:  Verbal   Consent given by:  Patient   Risks, benefits, and alternatives were discussed: yes     Risks discussed:  Infection, pain, poor cosmetic result and need for additional repair Universal protocol:    Procedure explained and questions answered to patient or proxy's satisfaction: yes     Relevant documents present and verified: yes     Imaging studies available: yes     Immediately  prior to procedure, a time out was called: yes     Patient identity confirmed:  Verbally with patient and arm band Anesthesia:    Anesthesia method:  None Laceration details:    Location:  Scalp   Scalp location:  R parietal   Length (cm):  3.5   Depth (mm):  2 Pre-procedure details:    Preparation:  Patient was prepped and draped in usual sterile fashion Exploration:    Hemostasis achieved with:  Direct pressure   Contaminated: no   Treatment:    Area cleansed with:  Povidone-iodine, chlorhexidine and saline   Amount of cleaning:  Standard   Debridement:  None   Undermining:  None   Scar revision: no   Skin repair:    Repair method:  Staples   Number of staples:  7 Approximation:    Approximation:  Close Repair type:    Repair type:  Simple Post-procedure details:    Dressing:  Open (no dressing)   Procedure completion:  Tolerated well, no immediate complications     ____________________________________________   INITIAL IMPRESSION /  ASSESSMENT AND PLAN / ED COURSE  As part of my medical decision making, I reviewed the following data within the electronic MEDICAL RECORD NUMBER         Patient presents with a scalp laceration secondary to trip and fall.  Alcohol was involved.  CT shows approximately 4 mm subdural hematoma on the right medial vertex posterior parietal region.  Discussed patient with supervising doctor and on call neurosurgeon.  Patient will be moved to major and rescan in 6 hours.      ____________________________________________   FINAL CLINICAL IMPRESSION(S) / ED DIAGNOSES  Final diagnoses:  Subdural hematoma [facility])     ED Discharge Orders    None      *Please note:  Alex Green was evaluated in Emergency Department on 09/06/2020 for the symptoms described in the history of present illness. He was evaluated in the context of the global COVID-19 pandemic, which necessitated consideration that the patient might be at risk for infection  with the SARS-CoV-2 virus that causes COVID-19. Institutional protocols and algorithms that pertain to the evaluation of patients at risk for COVID-19 are in a state of rapid change based on information released by regulatory bodies including the CDC and federal and state organizations. These policies and algorithms were followed during the patient's care in the ED.  Some ED evaluations and interventions may be delayed as a result of limited staffing during and the pandemic.*   Note:  This document was prepared using Dragon voice recognition software and may include unintentional dictation errors.    Joni Reining, PA-C 09/06/20 1147    Joni Reining, PA-C 09/06/20 1156    Dionne Bucy, MD 09/06/20 1236    Dionne Bucy, MD 09/06/20 [phone]

## 2020-09-06 NOTE — ED Provider Notes (Signed)
-----------------------------------------   3:01 PM on 09/06/2020 -----------------------------------------  Blood pressure 137/70, pulse 83, temperature 98.3 F (36.8 C), temperature source Oral, resp. rate 16, height 5\' 8"  (1.727 m), weight 81.6 kg, SpO2 98 %.  Assuming care from Dr. .  In short, Alex Green is a 55 y.o. male with a chief complaint of Fall .  Refer to the original H&P for additional details.  The current plan of care is to follow-up repeat head CT to ensure stability of small traumatic SDH.  ----------------------------------------- 6:27 PM on 09/06/2020 -----------------------------------------  Follow-up head CT is stable from previous, patient denies any complaints at this time.  He is appropriate for discharge home with PCP and neurosurgery follow-up.  Patient counseled to return to the ED for any new or worsening symptoms, patient agrees with plan.    09/08/2020, MD 09/06/20 [phone]

## 2020-09-06 NOTE — ED Notes (Signed)
Taken to ct  

## 2020-09-06 NOTE — ED Notes (Signed)
First nurse note: pt comes ems after falling this morning at 2am with etoh on board. Pt did hit his head with lac to head. CBG 141 with hx of T2D. Has not had etoh since 2am. Able to walk to wheelchair.

## 2020-09-06 NOTE — ED Provider Notes (Signed)
-----------------------------------------   3:14 PM on 09/06/2020 -----------------------------------------  Repeat head CT is pending.  I signed the patient out to the oncoming physician Dr. Larinda Buttery.   Dionne Bucy, MD 09/06/20 [phone]

## 2020-09-08 ENCOUNTER — Other Ambulatory Visit: Payer: Self-pay | Admitting: Neurosurgery

## 2020-09-08 ENCOUNTER — Other Ambulatory Visit (HOSPITAL_COMMUNITY): Payer: Self-pay | Admitting: Neurosurgery

## 2020-09-08 DIAGNOSIS — S065XAA Traumatic subdural hemorrhage with loss of consciousness status unknown, initial encounter: Secondary | ICD-10-CM

## 2020-09-08 DIAGNOSIS — S065X9A Traumatic subdural hemorrhage with loss of consciousness of unspecified duration, initial encounter: Secondary | ICD-10-CM

## 2020-09-21 ENCOUNTER — Inpatient Hospital Stay: Payer: Medicare Other | Admitting: Family Medicine

## 2020-09-25 ENCOUNTER — Inpatient Hospital Stay: Payer: Medicare Other | Admitting: Family Medicine

## 2020-09-26 ENCOUNTER — Other Ambulatory Visit: Payer: Self-pay

## 2020-09-26 ENCOUNTER — Ambulatory Visit (INDEPENDENT_AMBULATORY_CARE_PROVIDER_SITE_OTHER): Payer: Medicare Other | Admitting: Family Medicine

## 2020-09-26 ENCOUNTER — Encounter: Payer: Self-pay | Admitting: Family Medicine

## 2020-09-26 VITALS — BP 106/62 | HR 85 | Temp 97.5°F | Ht 68.0 in | Wt 171.6 lb

## 2020-09-26 DIAGNOSIS — J3089 Other allergic rhinitis: Secondary | ICD-10-CM | POA: Diagnosis not present

## 2020-09-26 DIAGNOSIS — S065X9A Traumatic subdural hemorrhage with loss of consciousness of unspecified duration, initial encounter: Secondary | ICD-10-CM | POA: Diagnosis not present

## 2020-09-26 DIAGNOSIS — S065XAA Traumatic subdural hemorrhage with loss of consciousness status unknown, initial encounter: Secondary | ICD-10-CM

## 2020-09-26 DIAGNOSIS — S0101XA Laceration without foreign body of scalp, initial encounter: Secondary | ICD-10-CM

## 2020-09-26 MED ORDER — FLUTICASONE PROPIONATE 50 MCG/ACT NA SUSP: 2.0000 | Freq: Every day | NASAL | 3 refills | Status: DC

## 2020-09-26 NOTE — Progress Notes (Signed)
Subjective:    Patient ID: Alex Green, male    DOB: 05-20-1966, 55 y.o.   MRN: [id number]  Alex Green is a 55 y.o. male presenting on 09/26/2020 for Hospitalization Follow-up (Pt was diagnosed with Subdural Hematoma, 2 CT scans were done on his head injury nothing was found. Pt declines headaches, removing staples off his head.) and Head Injury   HPI    ED FOLLOW-UP VISIT  Hospital/Location: [facility] Date of ED Visit: 09/06/20  Reason for Presenting to ED: Head Injury Primary (+Secondary) Diagnosis: accidental fall injury hit his head  FOLLOW-UP - ED provider note and record have been reviewed - Patient presents today about 3  weeks after recent ED visit. Brief summary of recent course, patient had symptoms of accidental fall with laceration on scalp, unsure exactly the scenario of the injury, admits had consumed alcohol, he was treated for scalp laceration, and had a 58mm subdural hematoma, on CT. Repeat scan in 6 hours completed stable from previous. Staples placed  - Today reports overall has done well after discharge from ED. Symptoms of bleeding and headaches. Admits first 2 days painful and sore but since then it has resolved  Here today to have staples removed. Now 3 weeks later.   Depression screen [facility] 2/9 07/31/2020 04/21/2020  Decreased Interest 1 2  Down, Depressed, Hopeless 1 2  PHQ - 2 Score 2 4  Altered sleeping 1 1  Tired, decreased energy 3 3  Change in appetite 1 3  Feeling bad or failure about yourself  1 1  Trouble concentrating 1 2  Moving slowly or fidgety/restless 0 0  Suicidal thoughts 0 0  PHQ-9 Score 9 14  Difficult doing work/chores Somewhat difficult Somewhat difficult    Social History   Tobacco Use  . Smoking status: Current Every Day Smoker    Packs/day: 1.50    Years: 10.00    Pack years: 15.00    Types: Cigarettes  . Smokeless tobacco: Never Used  Substance Use Topics  . Alcohol use: Yes    Alcohol/week: 50.0 standard drinks     Types: 50 Cans of beer per week  . Drug use: Not Currently    Review of Systems Per HPI unless specifically indicated above     Objective:    BP 106/62 (BP Location: Left Arm, Patient Position: Sitting, Cuff Size: Normal)   Pulse 85   Temp (!) 97.5 F (36.4 C) (Temporal)   Ht 5\' 8"  (1.727 m)   Wt 171 lb 9.6 oz (77.8 kg)   SpO2 100%   BMI 26.09 kg/m   Wt Readings from Last 3 Encounters:  09/26/20 171 lb 9.6 oz (77.8 kg)  09/06/20 180 lb (81.6 kg)  07/31/20 176 lb (79.8 kg)    Physical Exam Vitals and nursing note reviewed.  Constitutional:      General: He is not in acute distress.    Appearance: He is well-developed. He is not diaphoretic.     Comments: Well-appearing, comfortable, cooperative  HENT:     Head: Normocephalic and atraumatic.  Eyes:     General:        Right eye: No discharge.        Left eye: No discharge.     Conjunctiva/sclera: Conjunctivae normal.  Cardiovascular:     Rate and Rhythm: Normal rate.  Pulmonary:     Effort: Pulmonary effort is normal.  Skin:    General: Skin is warm and dry.     Findings:  No erythema or rash.     Comments: R scalp with scab covered laceration, has 7 staples intact, no significant edema or erythema, non tender.  Neurological:     Mental Status: He is alert and oriented to person, place, and time.  Psychiatric:        Behavior: Behavior normal.     Comments: Well groomed, good eye contact, normal speech and thoughts      ________________________________________________________ PROCEDURE NOTE Date - 09/26/20 Staple Removal - R scalp location - Location / Date Staples were placed: [facility] ED / 09/06/20 - # of Staples: 7 Discussed benefits and risks (including pain, bleeding, infection, wound separation). Verbal consent given by patient Procedure performed by: Randa Lynn CMA Medication: None Examination of the stapled laceration on scalp appears to be well healed. Appropriate # of staples counted and confirmed.  Removal of staples one by one using sterile staple remover. Removal was uncomplicated. - All staples were successfully removed and wound remains intact - 7 out of 7 staples were successfully removed   CT Head Wo ContrastPerformed 09/06/2020 Final result  Study Result CLINICAL DATA: Recent fall with headaches, initial encounter  EXAM: CT HEAD WITHOUT CONTRAST  CT CERVICAL SPINE WITHOUT CONTRAST  TECHNIQUE: Multidetector CT imaging of the head and cervical spine was performed following the standard protocol without intravenous contrast. Multiplanar CT image reconstructions of the cervical spine were also generated.  COMPARISON: None.  FINDINGS: CT HEAD FINDINGS  Brain: Small subdural hematoma is noted on the right near the vertex in the posterior parietal region. This measures approximately 4 mm and shows no significant mass effect due to mild underlying atrophy. No acute infarct or space-occupying mass lesion are seen.  Vascular: No hyperdense vessel or unexpected calcification.  Skull: Normal. Negative for fracture or focal lesion.  Sinuses/Orbits: No acute finding.  Other: Air is noted in the scalp on the right related to the recent injury this is in the general region of the recent injury.  CT CERVICAL SPINE FINDINGS  Alignment: Within normal limits.  Skull base and vertebrae: 7 cervical segments are well visualized. Vertebral body height is well maintained. Disc space narrowing is noted at C5-6 and C6-7 with mild associated osteophytic changes. Mild facet hypertrophic changes are seen. No acute fracture or acute facet abnormality is noted.  Soft tissues and spinal canal: Surrounding soft tissue structures are within normal limits.  Upper chest: Visualized lung apices are within normal limits.  Other: None  IMPRESSION: CT of the head: Small 4 mm thick right subdural hematoma along the vertex in the posterior parietal region.  Mild atrophic changes.  CT  of the cervical spine: Degenerative change without acute abnormality.  Critical Value/emergent results were called by telephone at the time of interpretation on 09/06/2020 at 11:25 am to Dr. Dionne Bucy , who verbally acknowledged these results.   Electronically Signed By: Alcide Clever M.D. On: 09/06/2020 11:22   --------------  CT Head Wo ContrastPerformed 09/06/2020 Final result  Study Result CLINICAL DATA: Head trauma, moderate/severe. Follow-up for subdural hematoma.  EXAM: CT HEAD WITHOUT CONTRAST  TECHNIQUE: Contiguous axial images were obtained from the base of the skull through the vertex without intravenous contrast.  COMPARISON: Head CT performed earlier today 09/06/2020.  FINDINGS: Brain:  Mild cerebral and cerebellar atrophy.  Unchanged size and appearance of a small mixed density subdural hematoma overlying the right cerebral hemisphere. As before, the subdural hematoma is most prominent overlying the right parietal lobe where there are acute blood products  and the collection measures up to 5-6 mm in thickness (remeasured on prior). No significant mass effect. No midline shift.  No interval intracranial hemorrhage is identified elsewhere.  Mild ill-defined hypoattenuation within the cerebral white matter is nonspecific, but compatible with chronic small vessel ischemic disease.  No demarcated cortical infarct.  No evidence of intracranial mass.  Vascular: No hyperdense vessel. Atherosclerotic calcifications.  Skull: Normal. Negative for fracture or focal lesion.  Sinuses/Orbits: Visualized orbits show no acute finding. Small amount of secretions within the bilateral nasal passages. No significant paranasal sinus disease at the imaged levels.  Other: Scalp staples at site of previously demonstrated right parietal scalp laceration  IMPRESSION: Stable size and appearance of a small mixed density subdural hematoma (containing acute  components) overlying the right cerebral hemisphere. As before, hematoma is greatest in thickness over the right parietal lobe (5-6 mm). No significant mass effect or midline shift.  Mild generalized parenchymal atrophy and chronic small vessel ischemic disease.  Scalp staples are now present at site of a previously demonstrated right parietal scalp laceration.   Electronically Signed By: Jackey LogeKyle Golden DO On: 09/06/2020 17:56       Results for orders placed or performed in visit on 07/31/20  Hemoglobin A1c  Result Value Ref Range   Hgb A1c MFr Bld 5.9 (H) <5.7 % of total Hgb   Mean Plasma Glucose 123 mg/dL   eAG (mmol/L) 6.8 mmol/L  CBC with Differential/Platelet  Result Value Ref Range   WBC 9.5 3.8 - 10.8 Thousand/uL   RBC 4.48 4.20 - 5.80 Million/uL   Hemoglobin 15.5 13.2 - 17.1 g/dL   HCT 40.943.7 81.138.5 - 91.450.0 %   MCV 97.5 80.0 - 100.0 fL   MCH 34.6 (H) 27.0 - 33.0 pg   MCHC 35.5 32.0 - 36.0 g/dL   RDW 78.212.1 95.611.0 - 21.315.0 %   Platelets 264 140 - 400 Thousand/uL   MPV 10.7 7.5 - 12.5 fL   Neutro Abs 6,935 1,500 - 7,800 cells/uL   Lymphs Abs 1,644 850 - 3,900 cells/uL   Absolute Monocytes 684 200 - 950 cells/uL   Eosinophils Absolute 181 15 - 500 cells/uL   Basophils Absolute 57 0 - 200 cells/uL   Neutrophils Relative % 73 %   Total Lymphocyte 17.3 %   Monocytes Relative 7.2 %   Eosinophils Relative 1.9 %   Basophils Relative 0.6 %  COMPLETE METABOLIC PANEL WITH GFR  Result Value Ref Range   Glucose, Bld 119 (H) 65 - 99 mg/dL   BUN 4 (L) 7 - 25 mg/dL   Creat 0.860.86 5.780.70 - 4.691.33 mg/dL   GFR, Est Non African American 97 > OR = 60 mL/min/1.173m2   GFR, Est African American 112 > OR = 60 mL/min/1.4173m2   BUN/Creatinine Ratio 5 (L) 6 - 22 (calc)   Sodium 133 (L) 135 - 146 mmol/L   Potassium 4.6 3.5 - 5.3 mmol/L   Chloride 95 (L) 98 - 110 mmol/L   CO2 26 20 - 32 mmol/L   Calcium 8.7 8.6 - 10.3 mg/dL   Total Protein 6.6 6.1 - 8.1 g/dL   Albumin 3.7 3.6 - 5.1 g/dL    Globulin 2.9 1.9 - 3.7 g/dL (calc)   AG Ratio 1.3 1.0 - 2.5 (calc)   Total Bilirubin 0.5 0.2 - 1.2 mg/dL   Alkaline phosphatase (APISO) 108 35 - 144 U/L   AST 43 (H) 10 - 35 U/L   ALT 30 9 - 46 U/L  Lipid panel  Result Value Ref Range   Cholesterol 140 <200 mg/dL   HDL 49 > OR = 40 mg/dL   Triglycerides 76 <920 mg/dL   LDL Cholesterol (Calc) 75 mg/dL (calc)   Total CHOL/HDL Ratio 2.9 <5.0 (calc)   Non-HDL Cholesterol (Calc) 91 <100 mg/dL (calc)      Assessment & Plan:   Problem List Items Addressed This Visit   None   Visit Diagnoses    Laceration of scalp, initial encounter    -  Primary   Subdural hematoma (HCC)       Seasonal allergic rhinitis due to other allergic trigger       Relevant Medications   fluticasone (FLONASE) 50 MCG/ACT nasal spray      ED follow-up Fall, with scalp laceration and subdural hematoma on imaging  Staple removal today, uncomplicated Routine wound care given, with scab still  Upcoming appointments with CT Head repeat scan by [facility] on 4/26 and then f/u with Dr Myer Haff on 4/28. Then has Dr Sherryll Burger consultation follow-up on 5/3  Start nasal steroid Flonase 2 sprays in each nostril daily for 4-6 weeks, may repeat course seasonally or as needed   Meds ordered this encounter  Medications  . fluticasone (FLONASE) 50 MCG/ACT nasal spray    Sig: Place 2 sprays into both nostrils daily. Use for 4-6 weeks then stop and use seasonally or as needed.    Dispense:  16 g    Refill:  3    Follow up plan: Return if symptoms worsen or fail to improve, for keep follow-up as scheduled.  Saralyn Pilar, DO [facility] 09/26/2020, 1:31 PM

## 2020-09-26 NOTE — Patient Instructions (Addendum)
Thank you for coming to the office today.  Keep apt upcoming with Neurosurgery and upcoming CT Scan repeat  Start nasal steroid Flonase 2 sprays in each nostril daily for 4-6 weeks, may repeat course seasonally or as needed  Trulicity can be un refrigerated for 14-21 days  If we don't have samples, maybe can get more in stock in future next 1-2 weeks.   Please schedule a Follow-up Appointment to: Return if symptoms worsen or fail to improve, for keep follow-up as scheduled.  If you have any other questions or concerns, please feel free to call the office or send a message through MyChart. You may also schedule an earlier appointment if necessary.  Additionally, you may be receiving a survey about your experience at our office within a few days to 1 week by e-mail or mail. We value your feedback.  Saralyn Pilar, DO [facility], [place]

## 2020-10-10 ENCOUNTER — Other Ambulatory Visit: Payer: Self-pay

## 2020-10-10 ENCOUNTER — Ambulatory Visit
Admission: RE | Admit: 2020-10-10 | Discharge: 2020-10-10 | Disposition: A | Discharge status: Home / Self Care | Payer: Medicare Other | Source: Ambulatory Visit | Attending: Neurosurgery | Admitting: Neurosurgery

## 2020-10-10 DIAGNOSIS — S065XAA Traumatic subdural hemorrhage with loss of consciousness status unknown, initial encounter: Secondary | ICD-10-CM

## 2020-10-10 DIAGNOSIS — S065X9A Traumatic subdural hemorrhage with loss of consciousness of unspecified duration, initial encounter: Secondary | ICD-10-CM | POA: Diagnosis not present

## 2020-10-30 ENCOUNTER — Telehealth: Payer: Self-pay | Admitting: Family Medicine

## 2020-10-30 NOTE — Telephone Encounter (Signed)
Copied from CRM [id number]. Topic: Medicare AWV >> Oct 30, 2020 10:44 AM Claudette Laws R wrote: Reason for CRM:  No answer unable to leave a message for patient to call back and schedule Medicare Annual Wellness Visit (AWV) to be done virtually or by telephone.  No hx of AWV eligible as of 09/16/2018 awvi  Please schedule at anytime with [facility] Advisor.      40 Minutes appointment   Any questions, please call me at [phone]

## 2020-11-06 ENCOUNTER — Other Ambulatory Visit: Payer: Self-pay | Admitting: Family Medicine

## 2020-11-06 DIAGNOSIS — I10 Essential (primary) hypertension: Secondary | ICD-10-CM

## 2020-11-06 DIAGNOSIS — E1140 Type 2 diabetes mellitus with diabetic neuropathy, unspecified: Secondary | ICD-10-CM

## 2020-11-06 DIAGNOSIS — F411 Generalized anxiety disorder: Secondary | ICD-10-CM

## 2020-11-06 MED ORDER — DULOXETINE HCL 60 MG PO CPEP: 60.0000 mg | ORAL_CAPSULE | Freq: Every day | ORAL | 0 refills | Status: DC

## 2020-11-06 MED ORDER — TRULICITY 1.5 MG/0.5ML ~~LOC~~ SOAJ: 1.5000 mg | SUBCUTANEOUS | 0 refills | Status: DC

## 2020-11-06 MED ORDER — VALSARTAN 80 MG PO TABS: 80.0000 mg | ORAL_TABLET | Freq: Every day | ORAL | 0 refills | Status: DC

## 2020-11-06 NOTE — Telephone Encounter (Signed)
Copied from CRM [id number]. Topic: Quick Communication - Rx Refill/Question >> Nov 06, 2020  4:23 PM Gaetana Michaelis A wrote: Medication: pregabalin (LYRICA) 150 MG capsule   valsartan (DIOVAN) 80 MG tablet   DULoxetine (CYMBALTA) 60 MG capsule   TRULICITY 1.5 MG/0.5ML SOPN   zolpidem (AMBIEN CR) 12.5 MG CR tablet   Has the patient contacted their pharmacy? No. Patient has not contacted their pharmacy.   Preferred Pharmacy (with phone number or street name): [facility] - [place], [place] - [address]  Phone:  [phone] Fax:  [phone]  Agent: Please be advised that RX refills may take up to 3 business days. We ask that you follow-up with your pharmacy.

## 2020-11-06 NOTE — Telephone Encounter (Signed)
Requested medications are due for refill today.  Yes  Requested medications are on the active medications list.  yes  Last refill. 07/31/2020 for both  Future visit scheduled.   yes  Notes to clinic.  Medications not delegated.

## 2020-11-06 NOTE — Telephone Encounter (Signed)
Requested Prescriptions  Pending Prescriptions Disp Refills  . zolpidem (AMBIEN CR) 12.5 MG CR tablet 90 tablet 1    Sig: Take 1 tablet (12.5 mg total) by mouth at bedtime as needed for sleep.     Not Delegated - Psychiatry:  Anxiolytics/Hypnotics Failed - 11/06/2020  5:27 PM      Failed - This refill cannot be delegated      Failed - Urine Drug Screen completed in last 360 days      Passed - Valid encounter within last 6 months    Recent Outpatient Visits          1 month ago Laceration of scalp, initial encounter   [facility] [place], Alex Neat, DO   3 months ago Controlled type 2 diabetes mellitus with neuropathy [facility])   [facility] Alex Cords, DO   6 months ago Controlled type 2 diabetes mellitus with neuropathy [facility])   [facility] Alex Cords, DO      Future Appointments            In 2 months Alex Green, Alex Neat, DO [facility]           . valsartan (DIOVAN) 80 MG tablet 90 tablet 0    Sig: Take 1 tablet (80 mg total) by mouth daily.     Cardiovascular:  Angiotensin Receptor Blockers Passed - 11/06/2020  5:27 PM      Passed - Cr in normal range and within 180 days    Creat  Date Value Ref Range Status  07/31/2020 0.86 0.70 - 1.33 mg/dL Final    Comment:    For patients >5 years of age, the reference limit for Creatinine is approximately 13% higher for people identified as African-American. .          Passed - K in normal range and within 180 days    Potassium  Date Value Ref Range Status  07/31/2020 4.6 3.5 - 5.3 mmol/L Final         Passed - Patient is not pregnant      Passed - Last BP in normal range    BP Readings from Last 1 Encounters:  09/26/20 106/62         Passed - Valid encounter within last 6 months    Recent Outpatient Visits          1 month ago Laceration of scalp, initial encounter   [facility] [name],  Alex Neat, DO   3 months ago Controlled type 2 diabetes mellitus with neuropathy [facility])   [facility] Alex Cords, DO   6 months ago Controlled type 2 diabetes mellitus with neuropathy [facility])   [facility] Alex Green, Alex Neat, DO      Future Appointments            In 2 months Alex Green, Alex Neat, DO [facility], [facility]           . TRULICITY 1.5 MG/0.5ML SOPN 6 mL 0    Sig: Inject 1.5 mg into the skin once a week.     Endocrinology:  Diabetes - GLP-1 Receptor Agonists Passed - 11/06/2020  5:27 PM      Passed - HBA1C is between 0 and 7.9 and within 180 days    Hemoglobin A1C  Date Value Ref Range Status  04/13/2020 7.0  Final   Hgb A1c MFr Bld  Date  Value Ref Range Status  07/31/2020 5.9 (H) <5.7 % of total Hgb Final    Comment:    For someone without known diabetes, a hemoglobin  A1c value between 5.7% and 6.4% is consistent with prediabetes and should be confirmed with a  follow-up test. . For someone with known diabetes, a value <7% indicates that their diabetes is well controlled. A1c targets should be individualized based on duration of diabetes, age, comorbid conditions, and other considerations. . This assay result is consistent with an increased risk of diabetes. . Currently, no consensus exists regarding use of hemoglobin A1c for diagnosis of diabetes for children. Alex Green - Valid encounter within last 6 months    Recent Outpatient Visits          1 month ago Laceration of scalp, initial encounter   [facility] [place], Alex Neat, DO   3 months ago Controlled type 2 diabetes mellitus with neuropathy [facility])   [facility] Alex Cords, DO   6 months ago Controlled type 2 diabetes mellitus with neuropathy [facility])   [facility] Alex Cords, DO      Future Appointments            In 2 months  Alex Green, Alex Neat, DO [facility], [facility]           . pregabalin (LYRICA) 150 MG capsule 360 capsule 1    Sig: Take 1 capsule (150 mg total) by mouth in the morning, at noon, in the evening, and at bedtime.     Not Delegated - Neurology:  Anticonvulsants - Controlled Failed - 11/06/2020  5:27 PM      Failed - This refill cannot be delegated      Passed - Valid encounter within last 12 months    Recent Outpatient Visits          1 month ago Laceration of scalp, initial encounter   [facility], Alex Neat, DO   3 months ago Controlled type 2 diabetes mellitus with neuropathy [facility])   [facility] Alex Cords, DO   6 months ago Controlled type 2 diabetes mellitus with neuropathy [facility])   [facility] Alex Cords, DO      Future Appointments            In 2 months Alex Green, Alex Neat, DO [facility], [facility]           . DULoxetine (CYMBALTA) 60 MG capsule 90 capsule 0    Sig: Take 1 capsule (60 mg total) by mouth daily.     Psychiatry: Antidepressants - SNRI Passed - 11/06/2020  5:27 PM      Passed - Last BP in normal range    BP Readings from Last 1 Encounters:  09/26/20 106/62         Passed - Valid encounter within last 6 months    Recent Outpatient Visits          1 month ago Laceration of scalp, initial encounter   [facility] [place], Alex Neat, DO   3 months ago Controlled type 2 diabetes mellitus with neuropathy [facility])   [facility] Alex Cords, DO   6 months ago Controlled type 2 diabetes mellitus with neuropathy [facility])   [facility], Alex Neat, DO  Future Appointments            In 2 months Alex Green, Alex Neat, DO [facility], [facility]

## 2020-11-07 MED ORDER — ZOLPIDEM TARTRATE ER 12.5 MG PO TBCR: 12.5000 mg | EXTENDED_RELEASE_TABLET | Freq: Every evening | ORAL | 0 refills | Status: DC | PRN

## 2020-11-07 MED ORDER — PREGABALIN 150 MG PO CAPS: 150.0000 mg | ORAL_CAPSULE | Freq: Four times a day (QID) | ORAL | 0 refills | Status: DC

## 2020-11-09 ENCOUNTER — Other Ambulatory Visit: Payer: Self-pay

## 2020-11-09 ENCOUNTER — Encounter: Payer: Self-pay | Admitting: Podiatry

## 2020-11-09 ENCOUNTER — Ambulatory Visit: Payer: Medicare Other | Admitting: Podiatry

## 2020-11-09 DIAGNOSIS — M79675 Pain in left toe(s): Secondary | ICD-10-CM | POA: Diagnosis not present

## 2020-11-09 DIAGNOSIS — M79674 Pain in right toe(s): Secondary | ICD-10-CM | POA: Diagnosis not present

## 2020-11-09 DIAGNOSIS — B351 Tinea unguium: Secondary | ICD-10-CM | POA: Insufficient documentation

## 2020-11-09 NOTE — Progress Notes (Signed)
This patient returns to my office for at risk foot care.  This patient requires this care by a professional since this patient will be at risk due to having diabetic neuropathy.  This patient is unable to cut nails himself since the patient cannot reach his nails.These nails are painful walking and wearing shoes.  This patient presents for at risk foot care today.  General Appearance  Alert, conversant and in no acute stress.  Vascular  Dorsalis pedis and posterior tibial  pulses are palpable  bilaterally.  Capillary return is within normal limits  bilaterally. Temperature is within normal limits  bilaterally.  Neurologic  Senn-Weinstein monofilament wire test absent bilaterally. Muscle power within normal limits bilaterally.  Nails Thick disfigured discolored nails with subungual debris  from hallux to fifth toes bilaterally. No evidence of bacterial infection or drainage bilaterally.  Orthopedic  No limitations of motion  feet .  No crepitus or effusions noted.  No bony pathology or digital deformities noted.  HAV  B/L.  Skin  normotropic skin with no porokeratosis noted bilaterally.  No signs of infections or ulcers noted.     Onychomycosis  Pain in right toes  Pain in left toes  Diabetes neuropathy.  Consent was obtained for treatment procedures.   Mechanical debridement of nails 1-5  bilaterally performed with a nail nipper.  Filed with dremel without incident. Patient qualifies for diabetic shoes due to DPN and HAV  B/L.   Return office visit  3 months                    Told patient to return for periodic foot care and evaluation due to potential at risk complications.   Helane Gunther DPM

## 2020-11-22 ENCOUNTER — Ambulatory Visit: Payer: Medicare Other | Attending: Neurology | Admitting: Physical Therapy

## 2020-11-27 ENCOUNTER — Ambulatory Visit: Payer: Medicare Other | Admitting: Physical Therapy

## 2020-11-29 ENCOUNTER — Ambulatory Visit: Payer: Medicare Other | Admitting: Physical Therapy

## 2020-12-05 ENCOUNTER — Ambulatory Visit: Payer: Medicare Other | Admitting: Physical Therapy

## 2020-12-12 ENCOUNTER — Ambulatory Visit: Payer: Medicare Other | Admitting: Physical Therapy

## 2020-12-13 ENCOUNTER — Ambulatory Visit: Payer: Medicare Other | Admitting: Physical Therapy

## 2020-12-19 ENCOUNTER — Ambulatory Visit: Payer: Medicare Other | Admitting: Physical Therapy

## 2020-12-21 ENCOUNTER — Ambulatory Visit: Payer: Medicare Other | Admitting: Physical Therapy

## 2020-12-25 ENCOUNTER — Ambulatory Visit: Payer: Medicare Other | Admitting: Physical Therapy

## 2020-12-26 ENCOUNTER — Encounter: Payer: Self-pay | Admitting: Family Medicine

## 2020-12-26 ENCOUNTER — Other Ambulatory Visit: Payer: Self-pay

## 2020-12-26 ENCOUNTER — Ambulatory Visit: Payer: Medicare Other | Admitting: Family Medicine

## 2020-12-26 VITALS — BP 127/71 | HR 83 | Ht 68.0 in | Wt 171.0 lb

## 2020-12-26 DIAGNOSIS — L97321 Non-pressure chronic ulcer of left ankle limited to breakdown of skin: Secondary | ICD-10-CM | POA: Diagnosis not present

## 2020-12-26 DIAGNOSIS — L97322 Non-pressure chronic ulcer of left ankle with fat layer exposed: Secondary | ICD-10-CM | POA: Diagnosis not present

## 2020-12-26 DIAGNOSIS — R29898 Other symptoms and signs involving the musculoskeletal system: Secondary | ICD-10-CM | POA: Insufficient documentation

## 2020-12-26 DIAGNOSIS — L853 Xerosis cutis: Secondary | ICD-10-CM | POA: Diagnosis not present

## 2020-12-26 DIAGNOSIS — E1140 Type 2 diabetes mellitus with diabetic neuropathy, unspecified: Secondary | ICD-10-CM

## 2020-12-26 DIAGNOSIS — I872 Venous insufficiency (chronic) (peripheral): Secondary | ICD-10-CM | POA: Insufficient documentation

## 2020-12-26 DIAGNOSIS — R296 Repeated falls: Secondary | ICD-10-CM | POA: Insufficient documentation

## 2020-12-26 MED ORDER — AMOXICILLIN-POT CLAVULANATE 875-125 MG PO TABS: 1.0000 | ORAL_TABLET | Freq: Two times a day (BID) | ORAL | 0 refills | Status: DC

## 2020-12-26 NOTE — Progress Notes (Signed)
Subjective:    Patient ID: Alex Green, male    DOB: 09-26-1965, 55 y.o.   MRN: [id number]  Alex Green is a 55 y.o. male presenting on 12/26/2020 for Foot Ulcer  Accompanied by significant other, Alex Green.  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 Some drainage mostly clear had some thicker now more clear. Using non adherent dressing change daily. Has dry skin dermatitis flaking some skin came off and revealed more shallow ulcer inner ankle that is not healing. Denies fever or chills sweats or spreading redness purulent drainage  Recurrent falls and generalized weakness Requesting HH PT since he is unable to get out to go to physical therapy, had prior order in past that was unable to follow through with. Fiance Alex Green is also disabled and has difficulty but helps him as caregiver as well at home. He uses cane for mobility. He does have several falls due to off balance and leg weakness, his neuro had ordered PT in past for him. He also would benefit from in home care assistance / personal care services, bathing and other help in future.   Depression screen [facility] 2/9 07/31/2020 04/21/2020  Decreased Interest 1 2  Down, Depressed, Hopeless 1 2  PHQ - 2 Score 2 4  Altered sleeping 1 1  Tired, decreased energy 3 3  Change in appetite 1 3  Feeling bad or failure about yourself  1 1  Trouble concentrating 1 2  Moving slowly or fidgety/restless 0 0  Suicidal thoughts 0 0  PHQ-9 Score 9 14  Difficult doing work/chores Somewhat difficult Somewhat difficult    Social History   Tobacco Use   Smoking status: Every Day    Packs/day: 1.50    Years: 10.00    Pack years: 15.00    Types: Cigarettes   Smokeless tobacco: Never  Substance Use Topics   Alcohol use: Yes     Alcohol/week: 50.0 standard drinks    Types: 50 Cans of beer per week   Drug use: Not Currently    Review of Systems Per HPI unless specifically indicated above     Objective:    BP 127/71   Pulse 83   Ht 5\' 8"  (1.727 m)   Wt 171 lb (77.6 kg)   SpO2 94%   BMI 26.00 kg/m   Wt Readings from Last 3 Encounters:  12/26/20 171 lb (77.6 kg)  09/26/20 171 lb 9.6 oz (77.8 kg)  09/06/20 180 lb (81.6 kg)    Physical Exam Vitals and nursing note reviewed.  Constitutional:      General: He is not in acute distress.    Appearance: Normal appearance. He is well-developed. He is not diaphoretic.     Comments: Well-appearing, comfortable, cooperative  HENT:     Head: Normocephalic and atraumatic.  Eyes:     General:        Right eye: No discharge.        Left eye: No discharge.     Conjunctiva/sclera: Conjunctivae normal.  Cardiovascular:     Rate and Rhythm: Normal rate.  Pulmonary:     Effort: Pulmonary effort is normal.  Skin:    General: Skin is warm and dry.     Findings: Lesion (left lateral ankle large oval/circular ulceration with granulation  healing issue, clear oozing. Left medial ankle with smaller superficial ulceration.) present. No erythema or rash.  Neurological:     Mental Status: He is alert and oriented to person, place, and time.  Psychiatric:        Mood and Affect: Mood normal.        Behavior: Behavior normal.        Thought Content: Thought content normal.     Comments: Well groomed, good eye contact, normal speech and thoughts   Results for orders placed or performed in visit on 07/31/20  Hemoglobin A1c  Result Value Ref Range   Hgb A1c MFr Bld 5.9 (H) <5.7 % of total Hgb   Mean Plasma Glucose 123 mg/dL   eAG (mmol/L) 6.8 mmol/L  CBC with Differential/Platelet  Result Value Ref Range   WBC 9.5 3.8 - 10.8 Thousand/uL   RBC 4.48 4.20 - 5.80 Million/uL   Hemoglobin 15.5 13.2 - 17.1 g/dL   HCT 82.9 93.7 - 16.9 %   MCV 97.5 80.0 - 100.0 fL   MCH 34.6  (H) 27.0 - 33.0 pg   MCHC 35.5 32.0 - 36.0 g/dL   RDW 67.8 93.8 - 10.1 %   Platelets 264 140 - 400 Thousand/uL   MPV 10.7 7.5 - 12.5 fL   Neutro Abs 6,935 1,500 - 7,800 cells/uL   Lymphs Abs 1,644 850 - 3,900 cells/uL   Absolute Monocytes 684 200 - 950 cells/uL   Eosinophils Absolute 181 15 - 500 cells/uL   Basophils Absolute 57 0 - 200 cells/uL   Neutrophils Relative % 73 %   Total Lymphocyte 17.3 %   Monocytes Relative 7.2 %   Eosinophils Relative 1.9 %   Basophils Relative 0.6 %  COMPLETE METABOLIC PANEL WITH GFR  Result Value Ref Range   Glucose, Bld 119 (H) 65 - 99 mg/dL   BUN 4 (L) 7 - 25 mg/dL   Creat 7.51 0.25 - 8.52 mg/dL   GFR, Est Non African American 97 > OR = 60 mL/min/1.25m2   GFR, Est African American 112 > OR = 60 mL/min/1.64m2   BUN/Creatinine Ratio 5 (L) 6 - 22 (calc)   Sodium 133 (L) 135 - 146 mmol/L   Potassium 4.6 3.5 - 5.3 mmol/L   Chloride 95 (L) 98 - 110 mmol/L   CO2 26 20 - 32 mmol/L   Calcium 8.7 8.6 - 10.3 mg/dL   Total Protein 6.6 6.1 - 8.1 g/dL   Albumin 3.7 3.6 - 5.1 g/dL   Globulin 2.9 1.9 - 3.7 g/dL (calc)   AG Ratio 1.3 1.0 - 2.5 (calc)   Total Bilirubin 0.5 0.2 - 1.2 mg/dL   Alkaline phosphatase (APISO) 108 35 - 144 U/L   AST 43 (H) 10 - 35 U/L   ALT 30 9 - 46 U/L  Lipid panel  Result Value Ref Range   Cholesterol 140 <200 mg/dL   HDL 49 > OR = 40 mg/dL   Triglycerides 76 <778 mg/dL   LDL Cholesterol (Calc) 75 mg/dL (calc)   Total CHOL/HDL Ratio 2.9 <5.0 (calc)   Non-HDL Cholesterol (Calc) 91 <242 mg/dL (calc)      Assessment & Plan:   Problem List Items Addressed This Visit     Weakness of both lower extremities   Relevant Orders   Ambulatory referral to Wound Clinic   AMB Referral to Community Care Coordinaton   Ambulatory referral to Home Health   Venous insufficiency of both lower extremities   Relevant Orders  AMB Referral to [facility] Coordinaton   Recurrent falls   Relevant Orders   Ambulatory referral to Wound  Clinic   AMB Referral to [facility] Coordinaton   Ambulatory referral to Home Health   Controlled type 2 diabetes mellitus with neuropathy (HCC)   Relevant Orders   AMB Referral to [facility] Coordinaton   Other Visit Diagnoses     Ankle ulcer, left, with fat layer exposed (HCC)    -  Primary   Relevant Medications   amoxicillin-clavulanate (AUGMENTIN) 875-125 MG tablet   Other Relevant Orders   Ambulatory referral to Wound Clinic   Ankle ulcer, left, limited to breakdown of skin (HCC)       Relevant Medications   amoxicillin-clavulanate (AUGMENTIN) 875-125 MG tablet   Other Relevant Orders   Ambulatory referral to Wound Clinic   Dry skin dermatitis           L Ankle ulcerations Complication of DM2, Neuropathy, venous stasis w/ dry skin dermatitis Referral to [facility] for further management - may need some HH wound care Start empiric Augmentin antibiotic, consider or offered additional Bactrim w/ remote history MRSA, not active but given no purulence or cellulitis will do monotherapy as empiric course May use topical antibiotic ointment, non adherent dressings as demonstrated today  Recurrent Falls / Generalized Weakness Type2 Diabetes with neuropathy Balance problems  Referral to [facility] PT and Chronic Care Management CCM services for further management in home, he will need current course of PT for falls/weakness. Has cane, may need other home adaptation for safety or other devices. He may need future PCS or caregiver support.  Orders Placed This Encounter  Procedures   Ambulatory referral to Wound Clinic    Referral Priority:   Routine    Referral Type:   Consultation    Referral Reason:   Specialty Services Required    Requested Specialty:   Wound Care    Number of Visits Requested:   1   AMB Referral to [facility] Coordinaton    Referral Priority:   Routine    Referral Type:   Consultation    Referral Reason:   Care Coordination    Number of  Visits Requested:   1   Ambulatory referral to Home Health    Referral Priority:   Routine    Referral Type:   Home Health Care    Referral Reason:   Specialty Services Required    Requested Specialty:   Home Health Services    Number of Visits Requested:   1     Meds ordered this encounter  Medications   amoxicillin-clavulanate (AUGMENTIN) 875-125 MG tablet    Sig: Take 1 tablet by mouth 2 (two) times daily.    Dispense:  20 tablet    Refill:  0      Follow up plan: Return if symptoms worsen or fail to improve.   Saralyn Pilar, DO [facility] 12/26/2020, 3:58 PM

## 2020-12-26 NOTE — Patient Instructions (Addendum)
Thank you for coming to the office today.  Start Antibiotic to prevent infect  Can change dressing, use non stick or non adherent pad and wrap  Can use topical antibiotic ointment as needed.  If worsening redness drainage of pus foul odor or spreading infection let me know, may need more urgent evaluation  [facility] [address], [place], [place] [address]  ~16.3 mi [phone]  [facility] Health Care Management team will call to help setup home assistance eventually.  Home Health PT referral for falls/weakness   Please schedule a Follow-up Appointment to: Return if symptoms worsen or fail to improve.  If you have any other questions or concerns, please feel free to call the office or send a message through MyChart. You may also schedule an earlier appointment if necessary.  Additionally, you may be receiving a survey about your experience at our office within a few days to 1 week by e-mail or mail. We value your feedback.  Saralyn Pilar, DO [facility] [place], [place]

## 2020-12-27 ENCOUNTER — Ambulatory Visit: Payer: Medicare Other | Admitting: Physical Therapy

## 2020-12-27 ENCOUNTER — Telehealth: Payer: Self-pay

## 2020-12-27 NOTE — Chronic Care Management (AMB) (Signed)
  Chronic Care Management   Note  12/27/2020 Name: Alex Green MRN: [id number] DOB: Sep 01, 1965  Alex Green is a 55 y.o. year old male who is a primary care patient of Olin Hauser, DO. I reached out to Alex Green by phone today in response to a referral sent by [name]'s PCP, Olin Hauser, DO      [name] given information about Chronic Care Management services today including:  CCM service includes personalized support from designated clinical staff supervised by his physician, including individualized plan of care and coordination with other care providers 24/7 contact phone numbers for assistance for urgent and routine care needs. Service will only be billed when office clinical staff spend 20 minutes or more in a month to coordinate care. Only one practitioner may furnish and bill the service in a calendar month. The patient may stop CCM services at any time (effective at the end of the month) by phone call to the office staff. The patient will be responsible for cost sharing (co-pay) of up to 20% of the service fee (after annual deductible is met).  Patient agreed to services and verbal consent obtained.   Follow up plan: Telephone appointment with care management team member scheduled for: Pharm D 12/29/2020 RN CM 01/08/2021 LCSW 01/09/2021  Noreene Larsson, [place], [place], [place] [address] Direct Dial: [phone] [email or web address]: [email or web address]

## 2020-12-27 NOTE — Progress Notes (Signed)
Patient has been scheduled

## 2020-12-29 ENCOUNTER — Telehealth: Payer: Medicare Other

## 2020-12-29 ENCOUNTER — Telehealth: Payer: Self-pay | Admitting: Pharmacist

## 2020-12-29 NOTE — Telephone Encounter (Signed)
  Chronic Care Management   Outreach Note  12/29/2020 Name: ASHTEN PRATS MRN: [id number] DOB: 1965/07/29  Referred by: [name], DO Reason for referral : No chief complaint on file.   Was unable to reach patient via telephone today and have left HIPAA compliant voicemail asking patient to return my call.    Follow Up Plan: Will collaborate with Care Guide to outreach to schedule follow up with me  Duanne Moron, PharmD, [facility] Clinical Pharmacist [facility] Care Management [phone]

## 2021-01-01 ENCOUNTER — Ambulatory Visit: Payer: Medicare Other | Admitting: Physical Therapy

## 2021-01-03 ENCOUNTER — Ambulatory Visit: Payer: Medicare Other | Admitting: Physical Therapy

## 2021-01-04 ENCOUNTER — Telehealth: Payer: Self-pay | Admitting: General Practice

## 2021-01-05 NOTE — Telephone Encounter (Signed)
  Care Management   Follow Up Note   01/05/2021 Name: Alex Green MRN: [id number] DOB: [birth year]   Referred by: [name], DO Reason for referral : Chronic Care Management (RNCM: Initial outreach for Chronic Disease Management and Care Coordination Needs)   An unsuccessful telephone outreach was attempted today. The patient was referred to the case management team for assistance with care management and care coordination.   Follow Up Plan: A HIPPA compliant phone message was left for the patient providing contact information and requesting a return call.  Alto Denver RN, MSN, CCM Community Care Coordinator [place]  [facility] [place] Mobile: [phone]

## 2021-01-08 ENCOUNTER — Ambulatory Visit: Payer: Medicare Other | Admitting: Physical Therapy

## 2021-01-08 ENCOUNTER — Telehealth: Payer: Medicare Other

## 2021-01-08 ENCOUNTER — Telehealth: Payer: Self-pay | Admitting: General Practice

## 2021-01-08 NOTE — Telephone Encounter (Signed)
Ok to give authorization to proceed.  Saralyn Pilar, DO [facility] 01/08/2021, 4:28 PM

## 2021-01-08 NOTE — Telephone Encounter (Signed)
  Care Management   Follow Up Note   01/08/2021 Name: Alex Green MRN: [id number] DOB: 1965-12-09   Referred by: [name], DO Reason for referral : Chronic Care Management (RNCM: Initial Outreach for Chronic Disease Management and Care Coordination Needs )   A second unsuccessful telephone outreach was attempted today. The patient was referred to the case management team for assistance with care management and care coordination. Called using jabber and work [employer]. Attempted last week to call the patient also.   Follow Up Plan: A HIPPA compliant phone message was left for the patient providing contact information and requesting a return call.   Alex Denver RN, MSN, CCM Community Care Coordinator [place]  [facility] [place] Mobile: [phone]

## 2021-01-08 NOTE — Telephone Encounter (Signed)
Alex Green advanced home health   Callback Number: [phone]  Requesting: PT  Frequency: 2x2, 1x4

## 2021-01-09 ENCOUNTER — Telehealth: Payer: Medicare Other

## 2021-01-09 ENCOUNTER — Telehealth: Payer: Self-pay | Admitting: Licensed Clinical Social Worker

## 2021-01-09 NOTE — Telephone Encounter (Signed)
    Clinical Social Work  Chronic Care Management   Phone Outreach    01/09/2021 Name: Alex Green MRN: [id number] DOB: February 22, 1966  [name] is a 55 y.o. year old male who is a primary care patient of [name], DO .   CCM LCSW reached out to patient today by phone to introduce self, assess needs and offer Care Management services and interventions.    Telephone outreach was unsuccessful A HIPPA compliant phone message was left for the patient providing contact information and requesting a return call.   Plan:CCM LCSW will wait for return call. If no return call is received, Will route chart to Care Guide to see if patient would like to reschedule phone appointment   Review of patient status, including review of consultants reports, relevant laboratory and other test results, and collaboration with appropriate care team members and the patient's provider was performed as part of comprehensive patient evaluation and provision of care management services.    Jenel Lucks, MSW, LCSW [facility] Care Management [place]  [email or web address] Phone [phone] 4:54 PM

## 2021-01-10 ENCOUNTER — Ambulatory Visit: Payer: Medicare Other | Admitting: Physical Therapy

## 2021-01-11 ENCOUNTER — Telehealth: Payer: Self-pay

## 2021-01-11 ENCOUNTER — Other Ambulatory Visit: Payer: Self-pay | Admitting: Family Medicine

## 2021-01-11 DIAGNOSIS — J3089 Other allergic rhinitis: Secondary | ICD-10-CM

## 2021-01-11 NOTE — Telephone Encounter (Signed)
Requested Prescriptions  Pending Prescriptions Disp Refills  . fluticasone (FLONASE) 50 MCG/ACT nasal spray [Pharmacy Med Name: FLUTICASONE PROP 50 MCG SPRAY] 48 mL 1    Sig: PLACE 2 SPRAYS INTO BOTH NOSTRILS DAILY FOR 4-6 WEEKS THEN STOP AND USE SEASONALLY OR AS NEEDED.     Ear, Nose, and Throat: Nasal Preparations - Corticosteroids Passed - 01/11/2021  3:34 AM      Passed - Valid encounter within last 12 months    Recent Outpatient Visits          2 weeks ago Ankle ulcer, left, with fat layer exposed [facility])   [facility], [name], DO   3 months ago Laceration of scalp, initial encounter   [facility], [name], DO   5 months ago Controlled type 2 diabetes mellitus with neuropathy [facility])   [facility] [name], DO   8 months ago Controlled type 2 diabetes mellitus with neuropathy [facility])   [facility] [name], DO      Future Appointments            In 2 weeks [name], [name], DO [facility], [facility]

## 2021-01-11 NOTE — Chronic Care Management (AMB) (Signed)
  Care Management   Note  01/11/2021 Name: KAINON VARADY MRN: [id number] DOB: 1965-08-13  [name] is a 55 y.o. year old male who is a primary care patient of [name], DO and is actively engaged with the care management team. I reached out to [name] by phone today to assist with re-scheduling a follow up visit with the RN Case Manager, Pharmacist, and Licensed Clinical Social Worker  Follow up plan: Unsuccessful telephone outreach attempt made. A HIPAA compliant phone message was left for the patient providing contact information and requesting a return call.  The care management team will reach out to the patient again over the next 7 days.  If patient returns call to provider office, please advise to call Embedded Care Management Care Guide Penne Lash  at [phone]  Penne Lash, RMA Care Guide, Embedded Care Coordination [facility]  [place], [place] [address] Direct Dial: [phone] [email or web address]: [email or web address]

## 2021-01-18 ENCOUNTER — Encounter: Payer: Medicare Other | Attending: Physician Assistant | Admitting: Physician Assistant

## 2021-01-18 ENCOUNTER — Other Ambulatory Visit: Payer: Self-pay

## 2021-01-18 DIAGNOSIS — I872 Venous insufficiency (chronic) (peripheral): Secondary | ICD-10-CM | POA: Diagnosis not present

## 2021-01-18 DIAGNOSIS — E11621 Type 2 diabetes mellitus with foot ulcer: Secondary | ICD-10-CM | POA: Diagnosis present

## 2021-01-18 DIAGNOSIS — F17200 Nicotine dependence, unspecified, uncomplicated: Secondary | ICD-10-CM | POA: Diagnosis not present

## 2021-01-18 DIAGNOSIS — L97322 Non-pressure chronic ulcer of left ankle with fat layer exposed: Secondary | ICD-10-CM | POA: Diagnosis not present

## 2021-01-18 DIAGNOSIS — E1140 Type 2 diabetes mellitus with diabetic neuropathy, unspecified: Secondary | ICD-10-CM | POA: Insufficient documentation

## 2021-01-19 NOTE — Progress Notes (Signed)
AMMAN, BARTEL ([id number]) Visit Report for 01/18/2021 Allergy List Details Patient Name: Alex Green, Alex Green. Date of Service: 01/18/2021 12:45 PM Medical Record Number: [id number] Patient Account Number: [id number] Date of Birth/Sex: 03-01-1966 (55 y.o. M) Treating RN: Dolan Amen Primary Care Agata Lucente: Nobie Putnam Other Clinician: Referring Talise Sligh: Nobie Putnam Treating Tyriana Helmkamp/Extender: Jeri Cos Weeks in Treatment: 0 Allergies Active Allergies No Known Drug Allergies Allergy Notes Electronic Signature(s) Signed: 01/19/2021 8:14:09 AM By: Dolan Amen RN Entered By: Dolan Amen on 01/18/2021 13:14:17 Alex Green ([id number]) -------------------------------------------------------------------------------- Arrival Information Details Patient Name: Alex Green Date of Service: 01/18/2021 12:45 PM Medical Record Number: [id number] Patient Account Number: [id number] Date of Birth/Sex: 12-Nov-1965 (54 y.o. M) Treating RN: Dolan Amen Primary Care Dex Blakely: Nobie Putnam Other Clinician: Referring Tenna Lacko: Nobie Putnam Treating Sura Canul/Extender: Skipper Cliche in Treatment: 0 Visit Information Patient Arrived: Kasandra Knudsen Arrival Time: 13:12 Accompanied By: self Transfer Assistance: None Patient Identification Verified: Yes Secondary Verification Process Completed: Yes Electronic Signature(s) Signed: 01/19/2021 8:14:09 AM By: Dolan Amen RN Entered By: Dolan Amen on 01/18/2021 13:12:38 Alex Green ([id number]) -------------------------------------------------------------------------------- Clinic Level of Care Assessment Details Patient Name: Alex Green Date of Service: 01/18/2021 12:45 PM Medical Record Number: [id number] Patient Account Number: [id number] Date of Birth/Sex: 03-19-1966 (55 y.o. M) Treating RN: Dolan Amen Primary Care Takahiro Godinho: Nobie Putnam Other Clinician: Referring  Ryn Peine: Nobie Putnam Treating Tychelle Purkey/Extender: Skipper Cliche in Treatment: 0 Clinic Level of Care Assessment Items TOOL 2 Quantity Score X - Use when only an EandM is performed on the INITIAL visit 1 0 ASSESSMENTS - Nursing Assessment / Reassessment X - General Physical Exam (combine w/ comprehensive assessment (listed just below) when performed on new 1 20 pt. evals) X- 1 25 Comprehensive Assessment (HX, ROS, Risk Assessments, Wounds Hx, etc.) ASSESSMENTS - Wound and Skin Assessment / Reassessment '[]'  - Simple Wound Assessment / Reassessment - one wound 0 X- 2 5 Complex Wound Assessment / Reassessment - multiple wounds '[]'  - 0 Dermatologic / Skin Assessment (not related to wound area) ASSESSMENTS - Ostomy and/or Continence Assessment and Care '[]'  - Incontinence Assessment and Management 0 '[]'  - 0 Ostomy Care Assessment and Management (repouching, etc.) PROCESS - Coordination of Care X - Simple Patient / Family Education for ongoing care 1 15 '[]'  - 0 Complex (extensive) Patient / Family Education for ongoing care '[]'  - 0 Staff obtains Programmer, systems, Records, Test Results / Process Orders '[]'  - 0 Staff telephones HHA, Nursing Homes / Clarify orders / etc '[]'  - 0 Routine Transfer to another Facility (non-emergent condition) '[]'  - 0 Routine Hospital Admission (non-emergent condition) '[]'  - 0 New Admissions / Biomedical engineer / Ordering NPWT, Apligraf, etc. '[]'  - 0 Emergency Hospital Admission (emergent condition) X- 1 10 Simple Discharge Coordination '[]'  - 0 Complex (extensive) Discharge Coordination PROCESS - Special Needs '[]'  - Pediatric / Minor Patient Management 0 '[]'  - 0 Isolation Patient Management '[]'  - 0 Hearing / Language / Visual special needs '[]'  - 0 Assessment of Community assistance (transportation, D/C planning, etc.) '[]'  - 0 Additional assistance / Altered mentation '[]'  - 0 Support Surface(s) Assessment (bed, cushion, seat, etc.) INTERVENTIONS -  Wound Cleansing / Measurement X - Wound Imaging (photographs - any number of wounds) 1 5 '[]'  - 0 Wound Tracing (instead of photographs) '[]'  - 0 Simple Wound Measurement - one wound X- 2 5 Complex Wound Measurement - multiple wounds Winsor, Shivam C. ([id number]) '[]'  - 0 Simple Wound Cleansing - one wound X-  2 5 Complex Wound Cleansing - multiple wounds INTERVENTIONS - Wound Dressings '[]'  - Small Wound Dressing one or multiple wounds 0 X- 1 15 Medium Wound Dressing one or multiple wounds '[]'  - 0 Large Wound Dressing one or multiple wounds '[]'  - 0 Application of Medications - injection INTERVENTIONS - Miscellaneous '[]'  - External ear exam 0 '[]'  - 0 Specimen Collection (cultures, biopsies, blood, body fluids, etc.) '[]'  - 0 Specimen(s) / Culture(s) sent or taken to Lab for analysis '[]'  - 0 Patient Transfer (multiple staff / Civil Service fast streamer / Similar devices) '[]'  - 0 Simple Staple / Suture removal (25 or less) '[]'  - 0 Complex Staple / Suture removal (26 or more) '[]'  - 0 Hypo / Hyperglycemic Management (close monitor of Blood Glucose) X- 1 15 Ankle / Brachial Index (ABI) - do not check if billed separately Has the patient been seen at the hospital within the last three years: Yes Total Score: 135 Level Of Care: New/Established - Level 4 Electronic Signature(s) Signed: 01/19/2021 8:14:09 AM By: Dolan Amen RN Entered By: Dolan Amen on 01/18/2021 13:58:23 Alex Green ([id number]) -------------------------------------------------------------------------------- Encounter Discharge Information Details Patient Name: Alex Green. Date of Service: 01/18/2021 12:45 PM Medical Record Number: [id number] Patient Account Number: [id number] Date of Birth/Sex: Nov 08, 1965 (55 y.o. M) Treating RN: Dolan Amen Primary Care Dezaria Methot: Nobie Putnam Other Clinician: Referring Kalib Bhagat: Nobie Putnam Treating Pheonix Clinkscale/Extender: Skipper Cliche in Treatment: 0 Encounter  Discharge Information Items Post Procedure Vitals Discharge Condition: Stable Temperature (F): 98.6 Ambulatory Status: Cane Pulse (bpm): 87 Discharge Destination: Home Respiratory Rate (breaths/min): 18 Transportation: Private Auto Blood Pressure (mmHg): 133/76 Accompanied By: self Schedule Follow-up Appointment: Yes Clinical Summary of Care: Electronic Signature(s) Signed: 01/19/2021 8:11:32 AM By: Dolan Amen RN Entered By: Dolan Amen on 01/19/2021 08:11:32 Alex Green ([id number]) -------------------------------------------------------------------------------- Lower Extremity Assessment Details Patient Name: Alex Green. Date of Service: 01/18/2021 12:45 PM Medical Record Number: [id number] Patient Account Number: [id number] Date of Birth/Sex: 1965/12/04 (55 y.o. M) Treating RN: Dolan Amen Primary Care Saturnino Liew: Nobie Putnam Other Clinician: Referring Zarea Diesing: Nobie Putnam Treating Triton Heidrich/Extender: Jeri Cos Weeks in Treatment: 0 Edema Assessment Assessed: Shirlyn Goltz: Yes] Patrice Paradise: No] Edema: [Left: Ye] [Right: s] Vascular Assessment Pulses: Dorsalis Pedis Palpable: [Left:Yes] Doppler Audible: [Left:Yes] Posterior Tibial Palpable: [Left:No] Doppler Audible: [Left:Yes] Blood Pressure: Brachial: [Left:120] Dorsalis Pedis: 124 Ankle: Posterior Tibial: 118 Ankle Brachial Index: [Left:1.03] Electronic Signature(s) Signed: 01/19/2021 8:14:09 AM By: Dolan Amen RN Entered By: Dolan Amen on 01/18/2021 13:43:22 [place], Carney Bern ([id number]) -------------------------------------------------------------------------------- Multi Wound Chart Details Patient Name: Alex Green. Date of Service: 01/18/2021 12:45 PM Medical Record Number: [id number] Patient Account Number: [id number] Date of Birth/Sex: 1966/01/05 (55 y.o. M) Treating RN: Dolan Amen Primary Care Tereka Thorley: Nobie Putnam Other Clinician: Referring  Heddy Vidana: Nobie Putnam Treating Meya Clutter/Extender: Skipper Cliche in Treatment: 0 Vital Signs Height(in): 68 Pulse(bpm): 87 Weight(lbs): 179 Blood Pressure(mmHg): 133/76 Body Mass Index(BMI): 27 Temperature(F): 98.6 Respiratory Rate(breaths/min): 18 Photos: [N/A:N/A] Wound Location: Left, Lateral Malleolus Left, Medial Ankle N/A Wounding Event: Gradually Appeared Gradually Appeared N/A Primary Etiology: Venous Leg Ulcer Venous Leg Ulcer N/A Comorbid History: Type II Diabetes, Neuropathy Type II Diabetes, Neuropathy N/A Date Acquired: 12/18/2020 12/18/2020 N/A Weeks of Treatment: 0 0 N/A Wound Status: Open Open N/A Measurements L x W x D (cm) 4x2.5x0.1 1x0.5x0.1 N/A Area (cm) : 7.854 0.393 N/A Volume (cm) : 0.785 0.039 N/A % Reduction in Area: 0.00% 0.00% N/A % Reduction in Volume: 0.00% 0.00% N/A  Classification: Full Thickness Without Exposed Full Thickness Without Exposed N/A Support Structures Support Structures Exudate Amount: Large Large N/A Exudate Type: Serosanguineous Sanguinous N/A Exudate Color: red, brown red N/A Granulation Amount: Large (67-100%) Large (67-100%) N/A Granulation Quality: Red, Hyper-granulation Red N/A Necrotic Amount: None Present (0%) None Present (0%) N/A Exposed Structures: Fat Layer (Subcutaneous Tissue): Fat Layer (Subcutaneous Tissue): N/A Yes Yes Fascia: No Fascia: No Tendon: No Tendon: No Muscle: No Muscle: No Joint: No Joint: No Bone: No Bone: No Epithelialization: None Small (1-33%) N/A Treatment Notes Electronic Signature(s) Signed: 01/19/2021 8:14:09 AM By: Dolan Amen RN Entered By: Dolan Amen on 01/18/2021 13:54:22 Alex Green ([id number]) -------------------------------------------------------------------------------- [place] Details Patient Name: Alex Green. Date of Service: 01/18/2021 12:45 PM Medical Record Number: [id number] Patient Account Number: [id number] Date  of Birth/Sex: 1966-05-06 (55 y.o. M) Treating RN: Dolan Amen Primary Care Marialice Newkirk: Nobie Putnam Other Clinician: Referring Tricha Ruggirello: Nobie Putnam Treating Severina Sykora/Extender: Skipper Cliche in Treatment: 0 Active Inactive Orientation to the Wound Care Program Nursing Diagnoses: Knowledge deficit related to the wound healing center program Goals: Patient/caregiver will verbalize understanding of the [facility] Program Date Initiated: 01/18/2021 Target Resolution Date: 01/18/2021 Goal Status: Active Interventions: Provide education on orientation to the wound center Notes: Wound/Skin Impairment Nursing Diagnoses: Impaired tissue integrity Goals: Patient/caregiver will verbalize understanding of skin care regimen Date Initiated: 01/18/2021 Target Resolution Date: 01/18/2021 Goal Status: Active Ulcer/skin breakdown will have a volume reduction of 30% by week 4 Date Initiated: 01/18/2021 Target Resolution Date: 02/18/2021 Goal Status: Active Ulcer/skin breakdown will have a volume reduction of 50% by week 8 Date Initiated: 01/18/2021 Target Resolution Date: 03/20/2021 Goal Status: Active Ulcer/skin breakdown will have a volume reduction of 80% by week 12 Date Initiated: 01/18/2021 Target Resolution Date: 04/20/2021 Goal Status: Active Ulcer/skin breakdown will heal within 14 weeks Date Initiated: 01/18/2021 Target Resolution Date: 05/20/2021 Goal Status: Active Interventions: Assess patient/caregiver ability to obtain necessary supplies Assess patient/caregiver ability to perform ulcer/skin care regimen upon admission and as needed Assess ulceration(s) every visit Provide education on ulcer and skin care Treatment Activities: Referred to DME Aphrodite Harpenau for dressing supplies : 01/18/2021 Skin care regimen initiated : 01/18/2021 Notes: Electronic Signature(s) Signed: 01/19/2021 8:14:09 AM By: Dolan Amen RN Entered By: Dolan Amen on 01/18/2021  13:54:08 Alex Green ([id number]) Hortencia Conradi, Carney Bern ([id number]) -------------------------------------------------------------------------------- Pain Assessment Details Patient Name: Alex Green. Date of Service: 01/18/2021 12:45 PM Medical Record Number: [id number] Patient Account Number: [id number] Date of Birth/Sex: March 02, 1966 (55 y.o. M) Treating RN: Dolan Amen Primary Care Wileen Duncanson: Nobie Putnam Other Clinician: Referring Syaire Saber: Nobie Putnam Treating Reginae Wolfrey/Extender: Skipper Cliche in Treatment: 0 Active Problems Location of Pain Severity and Description of Pain Patient Has Paino No Site Locations Rate the pain. Current Pain Level: 0 Pain Management and Medication Current Pain Management: Electronic Signature(s) Signed: 01/19/2021 8:14:09 AM By: Dolan Amen RN Entered By: Dolan Amen on 01/18/2021 13:12:54 Alex Green ([id number]) -------------------------------------------------------------------------------- Patient/Caregiver Education Details Patient Name: Alex Green Date of Service: 01/18/2021 12:45 PM Medical Record Number: [id number] Patient Account Number: [id number] Date of Birth/Gender: [birth year] (55 y.o. M) Treating RN: Dolan Amen Primary Care Physician: Nobie Putnam Other Clinician: Referring Physician: Nobie Putnam Treating Physician/Extender: Skipper Cliche in Treatment: 0 Education Assessment Education Provided To: Patient Education Topics Provided Welcome To [facility]: Methods: Explain/Verbal Responses: State content correctly Wound/Skin Impairment: Methods: Explain/Verbal Responses: State content correctly Electronic Signature(s) Signed: 01/19/2021 8:14:09  AM By: Dolan Amen RN Entered By: Dolan Amen on 01/18/2021 14:10:30 Alex Green ([id number]) -------------------------------------------------------------------------------- Wound  Assessment Details Patient Name: Alex Green. Date of Service: 01/18/2021 12:45 PM Medical Record Number: [id number] Patient Account Number: [id number] Date of Birth/Sex: August 29, 1965 (55 y.o. M) Treating RN: Dolan Amen Primary Care Aryiah Monterosso: Nobie Putnam Other Clinician: Referring Nasiyah Laverdiere: Nobie Putnam Treating Namira Rosekrans/Extender: Skipper Cliche in Treatment: 0 Wound Status Wound Number: 1 Primary Etiology: Venous Leg Ulcer Wound Location: Left, Lateral Malleolus Wound Status: Open Wounding Event: Gradually Appeared Comorbid History: Type II Diabetes, Neuropathy Date Acquired: 12/18/2020 Weeks Of Treatment: 0 Clustered Wound: No Photos Wound Measurements Length: (cm) 4 Width: (cm) 2.5 Depth: (cm) 0.1 Area: (cm) 7.854 Volume: (cm) 0.785 % Reduction in Area: 0% % Reduction in Volume: 0% Epithelialization: None Tunneling: No Undermining: No Wound Description Classification: Full Thickness Without Exposed Support Structures Exudate Amount: Large Exudate Type: Serosanguineous Exudate Color: red, brown Foul Odor After Cleansing: No Slough/Fibrino No Wound Bed Granulation Amount: Large (67-100%) Exposed Structure Granulation Quality: Red, Hyper-granulation Fascia Exposed: No Necrotic Amount: None Present (0%) Fat Layer (Subcutaneous Tissue) Exposed: Yes Tendon Exposed: No Muscle Exposed: No Joint Exposed: No Bone Exposed: No Treatment Notes Wound #1 (Malleolus) Wound Laterality: Left, Lateral Cleanser Byram Ancillary Kit - 15 Day Supply Discharge Instruction: Use supplies as instructed; Kit contains: (15) Saline Bullets; (15) 3x3 Gauze; 15 pr Gloves Soap and Water Discharge Instruction: Gently cleanse wound with antibacterial soap, rinse and pat dry prior to dressing wounds Talamante, Galen C. ([id number]) Peri-Wound Care Topical Primary Dressing Hydrofera Blue Ready Transfer Foam, 2.5x2.5 (in/in) Discharge Instruction: Apply Hydrofera  Blue Ready to wound bed as directed Secondary Dressing ABD Pad 5x9 (in/in) Discharge Instruction: Cover with ABD pad Kerlix 4.5 x 4.1 (in/yd) Discharge Instruction: Apply Kerlix 4.5 x 4.1 (in/yd) as instructed Secured With 17M Medipore H Soft Cloth Surgical Tape, 2x2 (in/yd) Discharge Instruction: Secure kerlix Compression Wrap Compression Stockings Add-Ons Electronic Signature(s) Signed: 01/19/2021 8:14:09 AM By: Dolan Amen RN Entered By: Dolan Amen on 01/18/2021 13:31:42 Skolnik, Anakin C. ([id number]) -------------------------------------------------------------------------------- Wound Assessment Details Patient Name: Lenise Herald C. Date of Service: 01/18/2021 12:45 PM Medical Record Number: [id number] Patient Account Number: [id number] Date of Birth/Sex: 1965/08/16 (55 y.o. M) Treating RN: Dolan Amen Primary Care Shiloh Swopes: Nobie Putnam Other Clinician: Referring Jood Retana: Nobie Putnam Treating Ekin Pilar/Extender: Skipper Cliche in Treatment: 0 Wound Status Wound Number: 2 Primary Etiology: Venous Leg Ulcer Wound Location: Left, Medial Ankle Wound Status: Open Wounding Event: Gradually Appeared Comorbid History: Type II Diabetes, Neuropathy Date Acquired: 12/18/2020 Weeks Of Treatment: 0 Clustered Wound: No Photos Wound Measurements Length: (cm) 1 Width: (cm) 0.5 Depth: (cm) 0.1 Area: (cm) 0.393 Volume: (cm) 0.039 % Reduction in Area: 0% % Reduction in Volume: 0% Epithelialization: Small (1-33%) Tunneling: No Undermining: No Wound Description Classification: Full Thickness Without Exposed Support Structu Exudate Amount: Large Exudate Type: Sanguinous Exudate Color: red res Foul Odor After Cleansing: No Slough/Fibrino No Wound Bed Granulation Amount: Large (67-100%) Exposed Structure Granulation Quality: Red Fascia Exposed: No Necrotic Amount: None Present (0%) Fat Layer (Subcutaneous Tissue) Exposed: Yes Tendon Exposed:  No Muscle Exposed: No Joint Exposed: No Bone Exposed: No Treatment Notes Wound #2 (Ankle) Wound Laterality: Left, Medial Cleanser Byram Ancillary Kit - 15 Day Supply Discharge Instruction: Use supplies as instructed; Kit contains: (15) Saline Bullets; (15) 3x3 Gauze; 15 pr Gloves Soap and Water Discharge Instruction: Gently cleanse wound with antibacterial soap, rinse and pat dry prior to  dressing wounds Kocsis, Rishard C. ([id number]) Peri-Wound Care Topical Primary Dressing Hydrofera Blue Ready Transfer Foam, 2.5x2.5 (in/in) Discharge Instruction: Apply Hydrofera Blue Ready to wound bed as directed Secondary Dressing ABD Pad 5x9 (in/in) Discharge Instruction: Cover with ABD pad Kerlix 4.5 x 4.1 (in/yd) Discharge Instruction: Apply Kerlix 4.5 x 4.1 (in/yd) as instructed Secured With 29M Jennings Surgical Tape, 2x2 (in/yd) Discharge Instruction: Secure kerlix Compression Wrap Compression Stockings Add-Ons Electronic Signature(s) Signed: 01/19/2021 8:14:09 AM By: Dolan Amen RN Entered By: Dolan Amen on 01/18/2021 13:32:15 Alex Green ([id number]) -------------------------------------------------------------------------------- [place] Details Patient Name: Alex Green. Date of Service: 01/18/2021 12:45 PM Medical Record Number: [id number] Patient Account Number: [id number] Date of Birth/Sex: 05/23/1966 (55 y.o. M) Treating RN: Dolan Amen Primary Care Bhargav Barbaro: Nobie Putnam Other Clinician: Referring Lane Eland: Nobie Putnam Treating Shayana Hornstein/Extender: Skipper Cliche in Treatment: 0 Vital Signs Time Taken: 13:13 Temperature (F): 98.6 Height (in): 68 Pulse (bpm): 87 Source: Stated Respiratory Rate (breaths/min): 18 Weight (lbs): 179 Blood Pressure (mmHg): 133/76 Source: Measured Reference Range: 80 - 120 mg / dl Body Mass Index (BMI): 27.2 Electronic Signature(s) Signed: 01/19/2021 8:14:09 AM By: Dolan Amen  RN Entered By: Dolan Amen on 01/18/2021 13:13:26

## 2021-01-19 NOTE — Progress Notes (Signed)
Alex, Green ([id number]) Visit Report for 01/18/2021 Abuse/Suicide Risk Screen Details Patient Name: Alex Green, Alex Green. Date of Service: 01/18/2021 12:45 PM Medical Record Number: [id number] Patient Account Number: [id number] Date of Birth/Sex: 17-Nov-1965 (55 y.o. M) Treating RN: Rogers Blocker Primary Care Rylan Bernard: Saralyn Pilar Other Clinician: Referring Tersea Aulds: Saralyn Pilar Treating Tiffanie Blassingame/Extender: Rowan Blase in Treatment: 0 Abuse/Suicide Risk Screen Items Answer ABUSE RISK SCREEN: Has anyone close to you tried to hurt or harm you recentlyo No Do you feel uncomfortable with anyone in your familyo No Has anyone forced you do things that you didnot want to doo No Electronic Signature(s) Signed: 01/19/2021 8:14:09 AM By: Rogers Blocker RN Entered By: Rogers Blocker on 01/18/2021 13:19:33 Alex Green ([id number]) -------------------------------------------------------------------------------- Activities of Daily Living Details Patient Name: Alex Green. Date of Service: 01/18/2021 12:45 PM Medical Record Number: [id number] Patient Account Number: [id number] Date of Birth/Sex: 1966-04-17 (55 y.o. M) Treating RN: Rogers Blocker Primary Care Tulani Kidney: Saralyn Pilar Other Clinician: Referring Ellinore Merced: Saralyn Pilar Treating Fanchon Papania/Extender: Rowan Blase in Treatment: 0 Activities of Daily Living Items Answer Activities of Daily Living (Please select one for each item) Drive Automobile Not Able Take Medications Completely Able Use Telephone Completely Able Care for Appearance Completely Able Use Toilet Completely Able Bath / Shower Completely Able Dress Self Completely Able Feed Self Completely Able Walk Completely Able Get In / Out Bed Completely Able Housework Completely Able Prepare Meals Completely Able Handle Money Completely Able Shop for Self Completely Able Electronic Signature(s) Signed: 01/19/2021  8:14:09 AM By: Rogers Blocker RN Entered By: Rogers Blocker on 01/18/2021 13:19:51 Alex Green ([id number]) -------------------------------------------------------------------------------- Education Screening Details Patient Name: Alex Green. Date of Service: 01/18/2021 12:45 PM Medical Record Number: [id number] Patient Account Number: [id number] Date of Birth/Sex: Nov 03, 1965 (55 y.o. M) Treating RN: Rogers Blocker Primary Care Ramah Langhans: Saralyn Pilar Other Clinician: Referring Bayron Dalto: Saralyn Pilar Treating Robina Hamor/Extender: Rowan Blase in Treatment: 0 Primary Learner Assessed: Patient Learning Preferences/Education Level/Primary Language Learning Preference: Explanation, Demonstration Highest Education Level: College or Above Preferred Language: English Cognitive Barrier Language Barrier: No Translator Needed: No Memory Deficit: No Emotional Barrier: No Cultural/Religious Beliefs Affecting Medical Care: No Physical Barrier Impaired Vision: No Impaired Hearing: No Decreased Hand dexterity: No Knowledge/Comprehension Knowledge Level: Medium Comprehension Level: Medium Ability to understand written instructions: Medium Ability to understand verbal instructions: Medium Motivation Anxiety Level: Calm Cooperation: Cooperative Education Importance: Acknowledges Need Interest in Health Problems: Asks Questions Perception: Coherent Willingness to Engage in Self-Management Medium Activities: Readiness to Engage in Self-Management Medium Activities: Electronic Signature(s) Signed: 01/19/2021 8:14:09 AM By: Rogers Blocker RN Entered By: Rogers Blocker on 01/18/2021 13:20:25 Alex Green ([id number]) -------------------------------------------------------------------------------- Fall Risk Assessment Details Patient Name: Alex Green. Date of Service: 01/18/2021 12:45 PM Medical Record Number: [id number] Patient Account Number:  [id number] Date of Birth/Sex: 1965/09/01 (55 y.o. M) Treating RN: Rogers Blocker Primary Care Khaiden Segreto: Saralyn Pilar Other Clinician: Referring Mariesa Grieder: Saralyn Pilar Treating Jailee Jaquez/Extender: Rowan Blase in Treatment: 0 Fall Risk Assessment Items Have you had 2 or more falls in the last 12 monthso 0 Yes Have you had any fall that resulted in injury in the last 12 monthso 0 Yes FALLS RISK SCREEN History of falling - immediate or within 3 months 0 No Secondary diagnosis (Do you have 2 or more medical diagnoseso) 15 Yes Ambulatory aid None/bed rest/wheelchair/nurse 0 No Crutches/cane/walker 15 Yes Furniture 0 No Intravenous therapy Access/Saline/Heparin Lock 0 No Gait/Transferring Normal/ bed rest/  wheelchair 0 No Weak (short steps with or without shuffle, stooped but able to lift head while walking, may 10 Yes seek support from furniture) Impaired (short steps with shuffle, may have difficulty arising from chair, head down, impaired 0 No balance) Mental Status Oriented to own ability 0 Yes Electronic Signature(s) Signed: 01/19/2021 8:14:09 AM By: Rogers Blocker RN Entered By: Rogers Blocker on 01/18/2021 13:20:44 Alex Green ([id number]) -------------------------------------------------------------------------------- Foot Assessment Details Patient Name: Alex Cheeks C. Date of Service: 01/18/2021 12:45 PM Medical Record Number: [id number] Patient Account Number: [id number] Date of Birth/Sex: 1966/01/21 (55 y.o. M) Treating RN: Rogers Blocker Primary Care Brooksie Ellwanger: Saralyn Pilar Other Clinician: Referring Darcel Frane: Saralyn Pilar Treating Ambre Kobayashi/Extender: Rowan Blase in Treatment: 0 Foot Assessment Items Site Locations + = Sensation present, - = Sensation absent, C = Callus, U = Ulcer R = Redness, W = Warmth, M = Maceration, PU = Pre-ulcerative lesion F = Fissure, S = Swelling, D = Dryness Assessment Right:  Left: Other Deformity: No No Prior Foot Ulcer: No No Prior Amputation: No No Charcot Joint: No No Ambulatory Status: Ambulatory With Help Assistance Device: Cane Gait: Steady Electronic Signature(s) Signed: 01/19/2021 8:14:09 AM By: Rogers Blocker RN Entered By: Rogers Blocker on 01/18/2021 13:29:26 Alex Green ([id number]) -------------------------------------------------------------------------------- Nutrition Risk Screening Details Patient Name: Alex Cheeks C. Date of Service: 01/18/2021 12:45 PM Medical Record Number: [id number] Patient Account Number: [id number] Date of Birth/Sex: 18-Jul-1965 (55 y.o. M) Treating RN: Rogers Blocker Primary Care Merlinda Wrubel: Saralyn Pilar Other Clinician: Referring Dasani Thurlow: Saralyn Pilar Treating Tashema Tiller/Extender: Rowan Blase in Treatment: 0 Height (in): 68 Weight (lbs): 179 Body Mass Index (BMI): 27.2 Nutrition Risk Screening Items Score Screening NUTRITION RISK SCREEN: I have an illness or condition that made me change the kind and/or amount of food I eat 0 No I eat fewer than two meals per day 0 No I eat few fruits and vegetables, or milk products 0 No I have three or more drinks of beer, liquor or wine almost every day 0 No I have tooth or mouth problems that make it hard for me to eat 0 No I don't always have enough money to buy the food I need 0 No I eat alone most of the time 0 No I take three or more different prescribed or over-the-counter drugs a day 1 Yes Without wanting to, I have lost or gained 10 pounds in the last six months 0 No I am not always physically able to shop, cook and/or feed myself 0 No Nutrition Protocols Good Risk Protocol 0 No interventions needed Moderate Risk Protocol High Risk Proctocol Risk Level: Good Risk Score: 1 Electronic Signature(s) Signed: 01/19/2021 8:14:09 AM By: Rogers Blocker RN Entered By: Rogers Blocker on 01/18/2021 13:21:13

## 2021-01-19 NOTE — Progress Notes (Signed)
Alex Green, Alex Green ([id number]) Visit Report for 01/18/2021 Chief Complaint Document Details Patient Name: Alex Green, CORLETT. Date of Service: 01/18/2021 12:45 PM Medical Record Number: [id number] Patient Account Number: [id number] Date of Birth/Sex: July 02, 1965 (55 y.o. Male) Treating RN: Dolan Amen Primary Care Provider: Nobie Putnam Other Clinician: Referring Provider: Nobie Putnam Treating Provider/Extender: Skipper Cliche in Treatment: 0 Information Obtained from: Patient Chief Complaint Left ankle ulcers Electronic Signature(s) Signed: 01/18/2021 1:50:42 PM By: Worthy Keeler PA-C Entered By: Worthy Keeler on 01/18/2021 13:50:42 Alex Green ([id number]) -------------------------------------------------------------------------------- Debridement Details Patient Name: Alex Green Date of Service: 01/18/2021 12:45 PM Medical Record Number: [id number] Patient Account Number: [id number] Date of Birth/Sex: [birth year] (55 y.o. Male) Treating RN: Dolan Amen Primary Care Provider: Nobie Putnam Other Clinician: Referring Provider: Nobie Putnam Treating Provider/Extender: Skipper Cliche in Treatment: 0 Debridement Performed for Wound #1 Left,Lateral Malleolus Assessment: Performed By: Physician Tommie Sams., PA-C Debridement Type: Chemical/Enzymatic/Mechanical Agent Used: saline gauze Severity of Tissue Pre Debridement: Fat layer exposed Level of Consciousness (Pre- Awake and Alert procedure): Pre-procedure Verification/Time Out Yes - 13:55 Taken: Start Time: 13:55 Instrument: Other : gauze Bleeding: None Response to Treatment: Procedure was tolerated well Level of Consciousness (Post- Awake and Alert procedure): Post Debridement Measurements of Total Wound Length: (cm) 4 Width: (cm) 2.5 Depth: (cm) 0.1 Volume: (cm) 0.785 Character of Wound/Ulcer Post Debridement: Stable Severity of Tissue Post Debridement:  Fat layer exposed Post Procedure Diagnosis Same as Pre-procedure Electronic Signature(s) Signed: 01/18/2021 9:44:25 PM By: Worthy Keeler PA-C Signed: 01/19/2021 8:14:09 AM By: Dolan Amen RN Entered By: Dolan Amen on 01/18/2021 13:56:05 Alex Green, Alex Green ([id number]) -------------------------------------------------------------------------------- Debridement Details Patient Name: Alex Herald C. Date of Service: 01/18/2021 12:45 PM Medical Record Number: [id number] Patient Account Number: [id number] Date of Birth/Sex: [birth year] (55 y.o. Male) Treating RN: Dolan Amen Primary Care Provider: Nobie Putnam Other Clinician: Referring Provider: Nobie Putnam Treating Provider/Extender: Skipper Cliche in Treatment: 0 Debridement Performed for Wound #2 Left,Medial Ankle Assessment: Performed By: Physician Tommie Sams., PA-C Debridement Type: Chemical/Enzymatic/Mechanical Agent Used: saline gauze Severity of Tissue Pre Debridement: Fat layer exposed Level of Consciousness (Pre- Awake and Alert procedure): Pre-procedure Verification/Time Out Yes - 13:55 Taken: Start Time: 13:55 Instrument: Other : gauze Bleeding: None Response to Treatment: Procedure was tolerated well Level of Consciousness (Post- Awake and Alert procedure): Post Debridement Measurements of Total Wound Length: (cm) 1 Width: (cm) 0.5 Depth: (cm) 0.1 Volume: (cm) 0.039 Character of Wound/Ulcer Post Debridement: Stable Severity of Tissue Post Debridement: Fat layer exposed Post Procedure Diagnosis Same as Pre-procedure Electronic Signature(s) Signed: 01/18/2021 9:44:25 PM By: Worthy Keeler PA-C Signed: 01/19/2021 8:14:09 AM By: Dolan Amen RN Entered By: Dolan Amen on 01/18/2021 13:56:29 Alex Green, Alex Green ([id number]) -------------------------------------------------------------------------------- HPI Details Patient Name: Alex Herald C. Date of Service:  01/18/2021 12:45 PM Medical Record Number: [id number] Patient Account Number: [id number] Date of Birth/Sex: 01-Aug-1965 (55 y.o. Male) Treating RN: Dolan Amen Primary Care Provider: Nobie Putnam Other Clinician: Referring Provider: Nobie Putnam Treating Provider/Extender: Skipper Cliche in Treatment: 0 History of Present Illness HPI Description: 01/18/21 upon evaluation today patient presents for initial inspection here in the clinic concerning issues that he's been having with wounds which are venous in nature of the left lateral malleolus and left medial malleolus. Medial is actually not doing too poorly overall which is great news. In fact this is an area that he just scratched which calls the wound. Collateral malleolus actually appears  to show some signs of hyper granulation and is not doing quite as well. Overall I think that the patient would benefit from [facility] Dressing's a dressing of choice. I think this would help with both sides but especially the lateral portion. He does have a history of diabetes mellitus type II in his most recent hemoglobin A1c was 5.6. Otherwise he does have a history of chronic venous insufficiency although he doesn't appear to be too swollen right now which is great news. Electronic Signature(s) Signed: 01/18/2021 9:44:25 PM By: Worthy Keeler PA-C Entered By: Worthy Keeler on 01/18/2021 20:50:34 Alex Green ([id number]) -------------------------------------------------------------------------------- Physical Exam Details Patient Name: Alex Green. Date of Service: 01/18/2021 12:45 PM Medical Record Number: [id number] Patient Account Number: [id number] Date of Birth/Sex: 12-19-1965 (55 y.o. Male) Treating RN: Dolan Amen Primary Care Provider: Nobie Putnam Other Clinician: Referring Provider: Nobie Putnam Treating Provider/Extender: Skipper Cliche in Treatment: 0 Constitutional sitting or  standing blood pressure is within target range for patient.. pulse regular and within target range for patient.Marland Kitchen respirations regular, non- labored and within target range for patient.Marland Kitchen temperature within target range for patient.. Well-nourished and well-hydrated in no acute distress. Eyes conjunctiva clear no eyelid edema noted. pupils equal round and reactive to light and accommodation. Ears, Nose, Mouth, and Throat no gross abnormality of ear auricles or external auditory canals. normal hearing noted during conversation. mucus membranes moist. Respiratory normal breathing without difficulty. Cardiovascular 2+ dorsalis pedis/posterior tibialis pulses. no clubbing, cyanosis, significant edema, <3 sec cap refill. Musculoskeletal normal gait and posture. no significant deformity or arthritic changes, no loss or range of motion, no clubbing. Psychiatric this patient is able to make decisions and demonstrates good insight into disease process. Alert and Oriented x 3. pleasant and cooperative. Notes Upon inspection patients wound bed actually showed signs of fairly good granulation episodes patient at this point. There does not appear to be any evidence of active infection which is great news. With that being said I'm actually fairly pleased with the way things appear today and I think that with appropriate treatment we can probably get his wound moving in the appropriate direction. Electronic Signature(s) Signed: 01/18/2021 9:44:25 PM By: Worthy Keeler PA-C Entered By: Worthy Keeler on 01/18/2021 20:51:07 Alex Green ([id number]) -------------------------------------------------------------------------------- Physician Orders Details Patient Name: Alex Green Date of Service: 01/18/2021 12:45 PM Medical Record Number: [id number] Patient Account Number: [id number] Date of Birth/Sex: Oct 14, 1965 (55 y.o. Male) Treating RN: Dolan Amen Primary Care Provider: Nobie Putnam  Other Clinician: Referring Provider: Nobie Putnam Treating Provider/Extender: Skipper Cliche in Treatment: 0 Verbal / Phone Orders: No Diagnosis Coding ICD-10 Coding Code Description I87.2 Venous insufficiency (chronic) (peripheral) L97.322 Non-pressure chronic ulcer of left ankle with fat layer exposed E11.621 Type 2 diabetes mellitus with foot ulcer Follow-up Appointments o Return Appointment in 2 weeks. Bathing/ Shower/ Hygiene o May shower; gently cleanse wound with antibacterial soap, rinse and pat dry prior to dressing wounds Wound Treatment Wound #1 - Malleolus Wound Laterality: Left, Lateral Cleanser: Byram Ancillary Kit - 15 Day Supply (DME) (Generic) 3 x Per Week/30 Days Discharge Instructions: Use supplies as instructed; Kit contains: (15) Saline Bullets; (15) 3x3 Gauze; 15 pr Gloves Cleanser: Soap and Water 3 x Per Week/30 Days Discharge Instructions: Gently cleanse wound with antibacterial soap, rinse and pat dry prior to dressing wounds Primary Dressing: Hydrofera Blue Ready Transfer Foam, 2.5x2.5 (in/in) (DME) (Generic) 3 x Per Week/30 Days Discharge Instructions: Apply Hydrofera Blue Ready  to wound bed as directed Secondary Dressing: ABD Pad 5x9 (in/in) (DME) (Generic) 3 x Per Week/30 Days Discharge Instructions: Cover with ABD pad Secondary Dressing: Kerlix 4.5 x 4.1 (in/yd) (DME) (Generic) 3 x Per Week/30 Days Discharge Instructions: Apply Kerlix 4.5 x 4.1 (in/yd) as instructed Secured With: 24M Medipore H Soft Cloth Surgical Tape, 2x2 (in/yd) (DME) (Generic) 3 x Per Week/30 Days Discharge Instructions: Secure kerlix Wound #2 - Ankle Wound Laterality: Left, Medial Cleanser: Byram Ancillary Kit - 15 Day Supply (DME) (Generic) 3 x Per Week/30 Days Discharge Instructions: Use supplies as instructed; Kit contains: (15) Saline Bullets; (15) 3x3 Gauze; 15 pr Gloves Cleanser: Soap and Water 3 x Per Week/30 Days Discharge Instructions: Gently cleanse wound  with antibacterial soap, rinse and pat dry prior to dressing wounds Primary Dressing: Hydrofera Blue Ready Transfer Foam, 2.5x2.5 (in/in) (DME) (Generic) 3 x Per Week/30 Days Discharge Instructions: Apply Hydrofera Blue Ready to wound bed as directed Secondary Dressing: ABD Pad 5x9 (in/in) (DME) (Generic) 3 x Per Week/30 Days Discharge Instructions: Cover with ABD pad Secondary Dressing: Kerlix 4.5 x 4.1 (in/yd) (DME) (Generic) 3 x Per Week/30 Days Discharge Instructions: Apply Kerlix 4.5 x 4.1 (in/yd) as instructed Secured With: 24M Medipore H Soft Cloth Surgical Tape, 2x2 (in/yd) (DME) (Generic) 3 x Per Week/30 Days Discharge Instructions: Secure Alex Green, Alex Green ([id number]) Electronic Signature(s) Signed: 01/18/2021 9:44:25 PM By: Worthy Keeler PA-C Signed: 01/19/2021 8:14:09 AM By: Dolan Amen RN Entered By: Dolan Amen on 01/18/2021 14:11:22 Alex Green ([id number]) -------------------------------------------------------------------------------- Problem List Details Patient Name: Alex Herald C. Date of Service: 01/18/2021 12:45 PM Medical Record Number: [id number] Patient Account Number: [id number] Date of Birth/Sex: 02/01/1966 (55 y.o. Male) Treating RN: Dolan Amen Primary Care Provider: Nobie Putnam Other Clinician: Referring Provider: Nobie Putnam Treating Provider/Extender: Skipper Cliche in Treatment: 0 Active Problems ICD-10 Encounter Code Description Active Date MDM Diagnosis I87.2 Venous insufficiency (chronic) (peripheral) 01/18/2021 No Yes L97.322 Non-pressure chronic ulcer of left ankle with fat layer exposed 01/18/2021 No Yes E11.621 Type 2 diabetes mellitus with foot ulcer 01/18/2021 No Yes Inactive Problems Resolved Problems Electronic Signature(s) Signed: 01/18/2021 1:50:26 PM By: Worthy Keeler PA-C Entered By: Worthy Keeler on 01/18/2021 13:50:26 Alex Green, Alex C.  ([id number]) -------------------------------------------------------------------------------- Progress Note Details Patient Name: Alex Green. Date of Service: 01/18/2021 12:45 PM Medical Record Number: [id number] Patient Account Number: [id number] Date of Birth/Sex: [birth year] (55 y.o. Male) Treating RN: Dolan Amen Primary Care Provider: Nobie Putnam Other Clinician: Referring Provider: Nobie Putnam Treating Provider/Extender: Skipper Cliche in Treatment: 0 Subjective Chief Complaint Information obtained from Patient Left ankle ulcers History of Present Illness (HPI) 01/18/21 upon evaluation today patient presents for initial inspection here in the clinic concerning issues that he's been having with wounds which are venous in nature of the left lateral malleolus and left medial malleolus. Medial is actually not doing too poorly overall which is great news. In fact this is an area that he just scratched which calls the wound. Collateral malleolus actually appears to show some signs of hyper granulation and is not doing quite as well. Overall I think that the patient would benefit from [facility] Dressing's a dressing of choice. I think this would help with both sides but especially the lateral portion. He does have a history of diabetes mellitus type II in his most recent hemoglobin A1c was 5.6. Otherwise he does have a history of chronic venous insufficiency although he doesn't appear to be too swollen  right now which is great news. Patient History Information obtained from Patient. Allergies No Known Drug Allergies Social History Current every day smoker, Alcohol Use - Daily, Drug Use - No History, Caffeine Use - Never. Medical History Endocrine Patient has history of Type II Diabetes Denies history of Type I Diabetes Neurologic Patient has history of Neuropathy Patient is treated with Oral Agents. Blood sugar is not tested. Review of Systems  (ROS) Constitutional Symptoms (General Health) Denies complaints or symptoms of Fatigue, Fever, Chills, Marked Weight Change. Eyes Denies complaints or symptoms of Dry Eyes, Vision Changes, Glasses / Contacts. Ear/Nose/Mouth/Throat Denies complaints or symptoms of Difficult clearing ears, Sinusitis. Hematologic/Lymphatic Denies complaints or symptoms of Bleeding / Clotting Disorders, Human Immunodeficiency Virus. Respiratory Denies complaints or symptoms of Chronic or frequent coughs, Shortness of Breath. Cardiovascular Complains or has symptoms of LE edema. Denies complaints or symptoms of Chest pain. Gastrointestinal Denies complaints or symptoms of Frequent diarrhea, Nausea, Vomiting. Endocrine Denies complaints or symptoms of Hepatitis, Thyroid disease, Polydypsia (Excessive Thirst). Genitourinary Denies complaints or symptoms of Kidney failure/ Dialysis, Incontinence/dribbling. Immunological Denies complaints or symptoms of Hives, Itching. Integumentary (Skin) Complains or has symptoms of Wounds. Denies complaints or symptoms of Bleeding or bruising tendency, Breakdown, Swelling. Musculoskeletal Denies complaints or symptoms of Muscle Pain, Muscle Weakness. Neurologic Denies complaints or symptoms of Numbness/parasthesias, Focal/Weakness. Psychiatric Denies complaints or symptoms of Anxiety, Claustrophobia. Alex Green, Alex C. ([id number]) Objective Constitutional sitting or standing blood pressure is within target range for patient.. pulse regular and within target range for patient.Marland Kitchen respirations regular, non- labored and within target range for patient.Marland Kitchen temperature within target range for patient.. Well-nourished and well-hydrated in no acute distress. Vitals Time Taken: 1:13 PM, Height: 68 in, Source: Stated, Weight: 179 lbs, Source: Measured, BMI: 27.2, Temperature: 98.6 F, Pulse: 87 bpm, Respiratory Rate: 18 breaths/min, Blood Pressure: 133/76 mmHg. Eyes conjunctiva  clear no eyelid edema noted. pupils equal round and reactive to light and accommodation. Ears, Nose, Mouth, and Throat no gross abnormality of ear auricles or external auditory canals. normal hearing noted during conversation. mucus membranes moist. Respiratory normal breathing without difficulty. Cardiovascular 2+ dorsalis pedis/posterior tibialis pulses. no clubbing, cyanosis, significant edema, Musculoskeletal normal gait and posture. no significant deformity or arthritic changes, no loss or range of motion, no clubbing. Psychiatric this patient is able to make decisions and demonstrates good insight into disease process. Alert and Oriented x 3. pleasant and cooperative. General Notes: Upon inspection patients wound bed actually showed signs of fairly good granulation episodes patient at this point. There does not appear to be any evidence of active infection which is great news. With that being said I'm actually fairly pleased with the way things appear today and I think that with appropriate treatment we can probably get his wound moving in the appropriate direction. Integumentary (Hair, Skin) Wound #1 status is Open. Original cause of wound was Gradually Appeared. The date acquired was: 12/18/2020. The wound is located on the Left,Lateral Malleolus. The wound measures 4cm length x 2.5cm width x 0.1cm depth; 7.854cm^2 area and 0.785cm^3 volume. There is Fat Layer (Subcutaneous Tissue) exposed. There is no tunneling or undermining noted. There is a large amount of serosanguineous drainage noted. There is large (67-100%) red, hyper - granulation within the wound bed. There is no necrotic tissue within the wound bed. Wound #2 status is Open. Original cause of wound was Gradually Appeared. The date acquired was: 12/18/2020. The wound is located on the Left,Medial Ankle. The wound measures 1cm length x  0.5cm width x 0.1cm depth; 0.393cm^2 area and 0.039cm^3 volume. There is Fat Layer (Subcutaneous  Tissue) exposed. There is no tunneling or undermining noted. There is a large amount of sanguinous drainage noted. There is large (67-100%) red granulation within the wound bed. There is no necrotic tissue within the wound bed. Assessment Active Problems ICD-10 Venous insufficiency (chronic) (peripheral) Non-pressure chronic ulcer of left ankle with fat layer exposed Type 2 diabetes mellitus with foot ulcer Procedures Wound #1 Pre-procedure diagnosis of Wound #1 is a Venous Leg Ulcer located on the Left,Lateral Malleolus .Severity of Tissue Pre Debridement is: Fat layer exposed. There was a Chemical/Enzymatic/Mechanical debridement performed by Tommie Sams., PA-C. With the following instrument(s): gauze. Other agent used was saline gauze. A time out was conducted at 13:55, prior to the start of the procedure. There was no bleeding. The procedure was tolerated well. Post Debridement Measurements: 4cm length x 2.5cm width x 0.1cm depth; 0.785cm^3 volume. Character of Wound/Ulcer Post Debridement is stable. Severity of Tissue Post Debridement is: Fat layer exposed. Alex, Alex Green ([id number]) Post procedure Diagnosis Wound #1: Same as Pre-Procedure Wound #2 Pre-procedure diagnosis of Wound #2 is a Venous Leg Ulcer located on the Left,Medial Ankle .Severity of Tissue Pre Debridement is: Fat layer exposed. There was a Chemical/Enzymatic/Mechanical debridement performed by Tommie Sams., PA-C. With the following instrument(s): gauze. Other agent used was saline gauze. A time out was conducted at 13:55, prior to the start of the procedure. There was no bleeding. The procedure was tolerated well. Post Debridement Measurements: 1cm length x 0.5cm width x 0.1cm depth; 0.039cm^3 volume. Character of Wound/Ulcer Post Debridement is stable. Severity of Tissue Post Debridement is: Fat layer exposed. Post procedure Diagnosis Wound #2: Same as Pre-Procedure Plan Follow-up Appointments: Return  Appointment in 2 weeks. Bathing/ Shower/ Hygiene: May shower; gently cleanse wound with antibacterial soap, rinse and pat dry prior to dressing wounds WOUND #1: - Malleolus Wound Laterality: Left, Lateral Cleanser: Byram Ancillary Kit - 15 Day Supply (DME) (Generic) 3 x Per Week/30 Days Discharge Instructions: Use supplies as instructed; Kit contains: (15) Saline Bullets; (15) 3x3 Gauze; 15 pr Gloves Cleanser: Soap and Water 3 x Per Week/30 Days Discharge Instructions: Gently cleanse wound with antibacterial soap, rinse and pat dry prior to dressing wounds Primary Dressing: Hydrofera Blue Ready Transfer Foam, 2.5x2.5 (in/in) (DME) (Generic) 3 x Per Week/30 Days Discharge Instructions: Apply Hydrofera Blue Ready to wound bed as directed Secondary Dressing: ABD Pad 5x9 (in/in) (DME) (Generic) 3 x Per Week/30 Days Discharge Instructions: Cover with ABD pad Secondary Dressing: Kerlix 4.5 x 4.1 (in/yd) (DME) (Generic) 3 x Per Week/30 Days Discharge Instructions: Apply Kerlix 4.5 x 4.1 (in/yd) as instructed Secured With: 53M Medipore H Soft Cloth Surgical Tape, 2x2 (in/yd) (DME) (Generic) 3 x Per Week/30 Days Discharge Instructions: Secure kerlix WOUND #2: - Ankle Wound Laterality: Left, Medial Cleanser: Byram Ancillary Kit - 15 Day Supply (DME) (Generic) 3 x Per Week/30 Days Discharge Instructions: Use supplies as instructed; Kit contains: (15) Saline Bullets; (15) 3x3 Gauze; 15 pr Gloves Cleanser: Soap and Water 3 x Per Week/30 Days Discharge Instructions: Gently cleanse wound with antibacterial soap, rinse and pat dry prior to dressing wounds Primary Dressing: Hydrofera Blue Ready Transfer Foam, 2.5x2.5 (in/in) (DME) (Generic) 3 x Per Week/30 Days Discharge Instructions: Apply Hydrofera Blue Ready to wound bed as directed Secondary Dressing: ABD Pad 5x9 (in/in) (DME) (Generic) 3 x Per Week/30 Days Discharge Instructions: Cover with ABD pad Secondary Dressing: Kerlix 4.5 x  4.1 (in/yd) (DME)  (Generic) 3 x Per Week/30 Days Discharge Instructions: Apply Kerlix 4.5 x 4.1 (in/yd) as instructed Secured With: 67M Medipore H Soft Cloth Surgical Tape, 2x2 (in/yd) (DME) (Generic) 3 x Per Week/30 Days Discharge Instructions: Secure kerlix 1. I'm gonna recommend currently that we initiate treatment of Hydrofera Blue Dressing is going be a good option for the patient currently. 2. I'm also in recommend that we have the patient go ahead and continue with the dressing changes three times a week and I think that we should use a silicone Boarder Foam Dressing over top of this I think this is going to do well. 3. I would recommend patient monitor for any signs of worsening from the standpoint of swelling the right now I do not see anything that appears to be overtly and extremely swollen that would cause any limitation in healing. I did want the patient not to leave this open to air however. Please see above for specific wound care orders. We will see patient for re-evaluation in 1 week(s) here in the clinic. If anything worsens or changes patient will contact our office for additional recommendations. Electronic Signature(s) Signed: 01/18/2021 9:44:25 PM By: Worthy Keeler PA-C Entered By: Worthy Keeler on 01/18/2021 20:54:21 Alex Green ([id number]) -------------------------------------------------------------------------------- ROS/PFSH Details Patient Name: Alex Green Date of Service: 01/18/2021 12:45 PM Medical Record Number: [id number] Patient Account Number: [id number] Date of Birth/Sex: 1966-04-19 (55 y.o. Male) Treating RN: Dolan Amen Primary Care Provider: Nobie Putnam Other Clinician: Referring Provider: Nobie Putnam Treating Provider/Extender: Skipper Cliche in Treatment: 0 Information Obtained From Patient Constitutional Symptoms (General Health) Complaints and Symptoms: Negative for: Fatigue; Fever; Chills; Marked Weight  Change Eyes Complaints and Symptoms: Negative for: Dry Eyes; Vision Changes; Glasses / Contacts Ear/Nose/Mouth/Throat Complaints and Symptoms: Negative for: Difficult clearing ears; Sinusitis Hematologic/Lymphatic Complaints and Symptoms: Negative for: Bleeding / Clotting Disorders; Human Immunodeficiency Virus Respiratory Complaints and Symptoms: Negative for: Chronic or frequent coughs; Shortness of Breath Cardiovascular Complaints and Symptoms: Positive for: LE edema Negative for: Chest pain Gastrointestinal Complaints and Symptoms: Negative for: Frequent diarrhea; Nausea; Vomiting Endocrine Complaints and Symptoms: Negative for: Hepatitis; Thyroid disease; Polydypsia (Excessive Thirst) Medical History: Positive for: Type II Diabetes Negative for: Type I Diabetes Time with diabetes: 7 years Treated with: Oral agents Blood sugar tested every day: No Genitourinary Complaints and Symptoms: Negative for: Kidney failure/ Dialysis; Incontinence/dribbling Immunological Alex Green, Alex C. ([id number]) Complaints and Symptoms: Negative for: Hives; Itching Integumentary (Skin) Complaints and Symptoms: Positive for: Wounds Negative for: Bleeding or bruising tendency; Breakdown; Swelling Musculoskeletal Complaints and Symptoms: Negative for: Muscle Pain; Muscle Weakness Neurologic Complaints and Symptoms: Negative for: Numbness/parasthesias; Focal/Weakness Medical History: Positive for: Neuropathy Psychiatric Complaints and Symptoms: Negative for: Anxiety; Claustrophobia Oncologic Immunizations Pneumococcal Vaccine: Received Pneumococcal Vaccination: No Implantable Devices None Family and Social History Current every day smoker; Alcohol Use: Daily; Drug Use: No History; Caffeine Use: Never Electronic Signature(s) Signed: 01/18/2021 9:44:25 PM By: Worthy Keeler PA-C Signed: 01/19/2021 8:14:09 AM By: Dolan Amen RN Entered By: Dolan Amen on 01/18/2021  13:19:28 Alex Green ([id number]) -------------------------------------------------------------------------------- SuperBill Details Patient Name: Alex Green. Date of Service: 01/18/2021 Medical Record Number: [id number] Patient Account Number: [id number] Date of Birth/Sex: March 14, 1966 (55 y.o. Male) Treating RN: Dolan Amen Primary Care Provider: Nobie Putnam Other Clinician: Referring Provider: Nobie Putnam Treating Provider/Extender: Skipper Cliche in Treatment: 0 Diagnosis Coding ICD-10 Codes Code Description I87.2 Venous insufficiency (chronic) (peripheral) L97.322 Non-pressure chronic ulcer of left  ankle with fat layer exposed E11.621 Type 2 diabetes mellitus with foot ulcer Facility Procedures CPT4 Code: 01007121 Description: 97588 - WOUND CARE VISIT-LEV 4 EST PT Modifier: Quantity: 1 Physician Procedures CPT4 Code: 3254982 Description: 64158 - WC PHYS LEVEL 4 - NEW PT Modifier: Quantity: 1 CPT4 Code: Description: ICD-10 Diagnosis Description I87.2 Venous insufficiency (chronic) (peripheral) L97.322 Non-pressure chronic ulcer of left ankle with fat layer exposed E11.621 Type 2 diabetes mellitus with foot ulcer Modifier: Quantity: Electronic Signature(s) Signed: 01/18/2021 9:44:25 PM By: Worthy Keeler PA-C Entered By: Worthy Keeler on 01/18/2021 20:54:42

## 2021-01-22 NOTE — Chronic Care Management (AMB) (Signed)
  Care Management   Note  01/22/2021 Name: [name] MRN: [id number] DOB: 1966/01/19  Alex Green is a 55 y.o. year old male who is a primary care patient of [name], DO and is actively engaged with the care management team. I reached out to Alex Green by phone today to assist with re-scheduling an initial visit with the RN Case Manager, Pharmacist, and Licensed Clinical Social Worker  Follow up plan: Unsuccessful telephone outreach attempt made. A HIPAA compliant phone message was left for the patient providing contact information and requesting a return call.  The care management team will reach out to the patient again over the next 7 days.  If patient returns call to provider office, please advise to call Embedded Care Management Care Guide Penne Lash  at [phone]  Penne Lash, RMA Care Guide, Embedded Care Coordination [facility]  [place], [place] [address] Direct Dial: [phone] [email or web address]

## 2021-01-29 ENCOUNTER — Ambulatory Visit: Payer: Self-pay | Admitting: Family Medicine

## 2021-02-01 ENCOUNTER — Ambulatory Visit: Payer: Medicare Other | Admitting: Physician Assistant

## 2021-02-08 ENCOUNTER — Ambulatory Visit: Payer: Medicare Other | Admitting: Physician Assistant

## 2021-02-14 ENCOUNTER — Ambulatory Visit: Payer: Medicare Other | Admitting: Family Medicine

## 2021-02-14 ENCOUNTER — Other Ambulatory Visit: Payer: Self-pay

## 2021-02-14 ENCOUNTER — Encounter: Payer: Self-pay | Admitting: Family Medicine

## 2021-02-14 VITALS — BP 123/66 | HR 67 | Ht 68.0 in | Wt 170.0 lb

## 2021-02-14 DIAGNOSIS — E1140 Type 2 diabetes mellitus with diabetic neuropathy, unspecified: Secondary | ICD-10-CM | POA: Diagnosis not present

## 2021-02-14 DIAGNOSIS — F411 Generalized anxiety disorder: Secondary | ICD-10-CM | POA: Diagnosis not present

## 2021-02-14 DIAGNOSIS — E1142 Type 2 diabetes mellitus with diabetic polyneuropathy: Secondary | ICD-10-CM | POA: Diagnosis not present

## 2021-02-14 DIAGNOSIS — I10 Essential (primary) hypertension: Secondary | ICD-10-CM

## 2021-02-14 LAB — POCT GLYCOSYLATED HEMOGLOBIN (HGB A1C): Hemoglobin A1C: 5.5 % (ref 4.0–5.6)

## 2021-02-14 MED ORDER — ZOLPIDEM TARTRATE ER 12.5 MG PO TBCR: 12.5000 mg | EXTENDED_RELEASE_TABLET | Freq: Every evening | ORAL | 1 refills | Status: DC | PRN

## 2021-02-14 MED ORDER — TRULICITY 1.5 MG/0.5ML ~~LOC~~ SOAJ: 1.5000 mg | SUBCUTANEOUS | 1 refills | Status: DC

## 2021-02-14 MED ORDER — VALSARTAN 80 MG PO TABS: 80.0000 mg | ORAL_TABLET | Freq: Every day | ORAL | 1 refills | Status: DC

## 2021-02-14 MED ORDER — DULOXETINE HCL 60 MG PO CPEP: 60.0000 mg | ORAL_CAPSULE | Freq: Every day | ORAL | 1 refills | Status: DC

## 2021-02-14 MED ORDER — PREGABALIN 150 MG PO CAPS: 150.0000 mg | ORAL_CAPSULE | Freq: Four times a day (QID) | ORAL | 1 refills | Status: DC

## 2021-02-14 NOTE — Progress Notes (Signed)
Subjective:    Patient ID: Alex Green, male    DOB: 05/22/1966, 55 y.o.   MRN: [id number]  Alex Green is a 55 y.o. male presenting on 02/14/2021 for Diabetes   HPI  CHRONIC DM, Type 2, with neuropathy HTN Reports history of initial dx DM >6 years ago, with A1c 12.1 in past Improved A1c control prior range 5.9% now due today for A1c CBGs: Has glucometer again, no detailed report today. Meds: Trulicity 1.5mg  weekly - Off Metformin and Amaryl Reports good compliance. Tolerating well w/o side-effects Currently on ARB  Lifestyle: - Diet (improving) Exercise (limited due to neuropathy) - Admits chronic neuropathy - he takes Lyrica 150mg  4 times daily. He is unable to maintain balance. Has seen [facility] Dr [facility] for Neurology Eye doctor is in [place], and establishing with local Eye Doctor Denies hypoglycemia, polyuria, visual changes, numbness or tingling.  Chronic problem with Balance / Neuropathy He had complicated history with fall head injury, has seen Dr [place] [facility] Also issues with balance, complication as well with neuropathy Previously lost referral to [facility] PT he has transportation limitation Now he needs new Referral to [facility] handwritten order given today On Lyrica, Duloxetine.  Left Ankle Skin Ulceration Type 2 Diabetes with neuropathy Improving Followed by [facility] has apt tomorrow Denies fever or chills sweats or spreading redness purulent drainage    Depression screen [facility] 2/9 02/14/2021 07/31/2020 04/21/2020  Decreased Interest 1 1 2   Down, Depressed, Hopeless 1 1 2   PHQ - 2 Score 2 2 4   Altered sleeping 1 1 1   Tired, decreased energy 3 3 3   Change in appetite 1 1 3   Feeling bad or failure about yourself  1 1 1   Trouble concentrating 1 1 2   Moving slowly or fidgety/restless 0 0 0  Suicidal thoughts 0 0 0  PHQ-9 Score 9 9 14   Difficult doing work/chores Somewhat difficult Somewhat difficult Somewhat difficult    Social History    Tobacco Use   Smoking status: Every Day    Packs/day: 1.50    Years: 10.00    Pack years: 15.00    Types: Cigarettes   Smokeless tobacco: Never  Substance Use Topics   Alcohol use: Yes    Alcohol/week: 50.0 standard drinks    Types: 50 Cans of beer per week   Drug use: Not Currently    Review of Systems Per HPI unless specifically indicated above     Objective:    BP 123/66   Pulse 67   Ht 5\' 8"  (1.727 m)   Wt 170 lb (77.1 kg)   SpO2 95%   BMI 25.85 kg/m   Wt Readings from Last 3 Encounters:  02/14/21 170 lb (77.1 kg)  12/26/20 171 lb (77.6 kg)  09/26/20 171 lb 9.6 oz (77.8 kg)    Physical Exam Vitals and nursing note reviewed.  Constitutional:      General: He is not in acute distress.    Appearance: Normal appearance. He is well-developed. He is not diaphoretic.     Comments: Well-appearing, comfortable, cooperative  HENT:     Head: Normocephalic and atraumatic.  Eyes:     General:        Right eye: No discharge.        Left eye: No discharge.     Conjunctiva/sclera: Conjunctivae normal.  Cardiovascular:     Rate and Rhythm: Normal rate.  Pulmonary:     Effort: Pulmonary effort is normal.  Musculoskeletal:     Comments: Has cane with him for ambulation  Skin:    General: Skin is warm and dry.     Findings: No erythema or rash.  Neurological:     Mental Status: He is alert and oriented to person, place, and time.  Psychiatric:        Mood and Affect: Mood normal.        Behavior: Behavior normal.        Thought Content: Thought content normal.     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 Continue current BP regimen Valsartan 80mg  daily 2. Encourage improved lifestyle - low sodium diet, regular exercise 3. advised to monitor BP outside office, bring readings to next visit, if persistently >140/90 or new symptoms notify office sooner      Relevant Medications   valsartan (DIOVAN) 80 MG tablet   Controlled type 2 diabetes mellitus with neuropathy (HCC) - Primary    Well-controlled DM previous A1c 5.5% improved today Complications - peripheral neuropathy, hyperlipidemia, GERD, depression, obesity-  increases risk of future cardiovascular complications   Plan:  1. Refill Trulicity 1.5mg  weekly 2. Encourage improved lifestyle - low carb, low sugar diet, reduce portion size, continue improving regular exercise 3. Check CBG , bring log to next visit for review 4. Continue ARB, future consider statin      Relevant Medications   TRULICITY 1.5 MG/0.5ML SOPN   valsartan (DIOVAN) 80 MG tablet   pregabalin (LYRICA) 150 MG capsule   Other Relevant Orders   POCT HgB A1C (Completed)    Insomnia Refill Zolpidem  Neuropathy Weakness/Balance See above A&P Refill Lyrica Handwritten referral again to [facility] for balance therapy.  Wound, ulceration Has apt tomorrow at wound center  Meds ordered this encounter  Medications   TRULICITY 1.5 MG/0.5ML SOPN    Sig: Inject 1.5 mg into the skin once a week.    Dispense:  6 mL    Refill:  1   DULoxetine (CYMBALTA) 60 MG capsule    Sig: Take 1 capsule (60 mg total) by mouth  daily.    Dispense:  90 capsule    Refill:  1   valsartan (DIOVAN) 80 MG tablet    Sig: Take 1 tablet (80 mg total) by mouth daily.    Dispense:  90 tablet    Refill:  1   pregabalin (LYRICA) 150 MG capsule    Sig: Take 1 capsule (150 mg total) by mouth in the morning, at noon, in the  evening, and at bedtime.    Dispense:  360 capsule    Refill:  1   zolpidem (AMBIEN CR) 12.5 MG CR tablet    Sig: Take 1 tablet (12.5 mg total) by mouth at bedtime as needed for sleep.    Dispense:  90 tablet    Refill:  1      Follow up plan: Return in about 4 months (around 06/16/2021) for 4 month DM A1c, HTN, Balance / Neuro.   Saralyn Pilar, DO [facility] 02/14/2021, 1:40 PM

## 2021-02-14 NOTE — Patient Instructions (Addendum)
Thank you for coming to the office today.  Referral to [facility] handwritten can turn in when ready.  Recent Labs    04/13/20 0000 07/31/20 1125 02/14/21 1340  HGBA1C 7.0 5.9* 5.5     Please schedule a Follow-up Appointment to: Return in about 4 months (around 06/16/2021) for 4 month DM A1c, HTN, Balance / Neuro.  If you have any other questions or concerns, please feel free to call the office or send a message through MyChart. You may also schedule an earlier appointment if necessary.  Additionally, you may be receiving a survey about your experience at our office within a few days to 1 week by e-mail or mail. We value your feedback.  Saralyn Pilar, DO [facility], [place]

## 2021-02-14 NOTE — Assessment & Plan Note (Signed)
Well-controlled DM previous A1c 5.5% improved today Complications - peripheral neuropathy, hyperlipidemia, GERD, depression, obesity-  increases risk of future cardiovascular complications   Plan:  1. Refill Trulicity 1.5mg  weekly 2. Encourage improved lifestyle - low carb, low sugar diet, reduce portion size, continue improving regular exercise 3. Check CBG , bring log to next visit for review 4. Continue ARB, future consider statin

## 2021-02-14 NOTE — Assessment & Plan Note (Addendum)
Well-controlled HTN - Home BP readings      Plan:  1. Continue current BP regimen Valsartan 80mg daily 2. Encourage improved lifestyle - low sodium diet, regular exercise 3. advised to monitor BP outside office, bring readings to next visit, if persistently >140/90 or new symptoms notify office sooner 

## 2021-02-15 ENCOUNTER — Ambulatory Visit: Payer: Medicare Other | Admitting: Podiatry

## 2021-02-15 ENCOUNTER — Encounter: Payer: Medicare Other | Attending: Physician Assistant | Admitting: Physician Assistant

## 2021-02-15 DIAGNOSIS — I872 Venous insufficiency (chronic) (peripheral): Secondary | ICD-10-CM | POA: Insufficient documentation

## 2021-02-15 DIAGNOSIS — E1140 Type 2 diabetes mellitus with diabetic neuropathy, unspecified: Secondary | ICD-10-CM | POA: Diagnosis not present

## 2021-02-15 DIAGNOSIS — E11621 Type 2 diabetes mellitus with foot ulcer: Secondary | ICD-10-CM | POA: Diagnosis not present

## 2021-02-15 DIAGNOSIS — L97322 Non-pressure chronic ulcer of left ankle with fat layer exposed: Secondary | ICD-10-CM | POA: Diagnosis not present

## 2021-02-15 DIAGNOSIS — E1151 Type 2 diabetes mellitus with diabetic peripheral angiopathy without gangrene: Secondary | ICD-10-CM | POA: Insufficient documentation

## 2021-02-15 LAB — GLUCOSE, CAPILLARY: Glucose-Capillary: 146 mg/dL — ABNORMAL HIGH (ref 70–99)

## 2021-02-15 NOTE — Progress Notes (Addendum)
DECARI, DUGGAR ([id number]) Visit Report for 02/15/2021 Chief Complaint Document Details Patient Name: Alex Green, Alex Green. Date of Service: 02/15/2021 2:00 PM Medical Record Number: [id number] Patient Account Number: [id number] Date of Birth/Sex: 03/26/1966 (55 y.o. M) Treating RN: Rogers Blocker Primary Care Provider: Saralyn Pilar Other Clinician: Referring Provider: Saralyn Pilar Treating Provider/Extender: Rowan Blase in Treatment: 4 Information Obtained from: Patient Chief Complaint Left ankle ulcers Electronic Signature(s) Signed: 02/15/2021 2:19:22 PM By: Lenda Kelp PA-C Entered By: Lenda Kelp on 02/15/2021 14:19:22 Alex Green ([id number]) -------------------------------------------------------------------------------- HPI Details Patient Name: Alex Green Date of Service: 02/15/2021 2:00 PM Medical Record Number: [id number] Patient Account Number: [id number] Date of Birth/Sex: 05-26-1966 (55 y.o. M) Treating RN: Rogers Blocker Primary Care Provider: Saralyn Pilar Other Clinician: Referring Provider: Saralyn Pilar Treating Provider/Extender: Rowan Blase in Treatment: 4 History of Present Illness HPI Description: 01/18/21 upon evaluation today patient presents for initial inspection here in the clinic concerning issues that he's been having with wounds which are venous in nature of the left lateral malleolus and left medial malleolus. Medial is actually not doing too poorly overall which is great news. In fact this is an area that he just scratched which calls the wound. Collateral malleolus actually appears to show some signs of hyper granulation and is not doing quite as well. Overall I think that the patient would benefit from [facility] Dressing's a dressing of choice. I think this would help with both sides but especially the lateral portion. He does have a history of diabetes mellitus type II in his most  recent hemoglobin A1c was 5.6. Otherwise he does have a history of chronic venous insufficiency although he doesn't appear to be too swollen right now which is great news. 02/15/2021 upon evaluation today patient appears to be doing well currently in regard to his wounds. He is actually been tolerating the dressing changes without complication. Fortunately there does not appear to be any evidence of infection which is great and overall other than the fact we have seen him for about a month he has Green all some when the wound is healed the other is much smaller. Electronic Signature(s) Signed: 02/15/2021 3:22:55 PM By: Lenda Kelp PA-C Entered By: Lenda Kelp on 02/15/2021 15:22:55 Alex Green ([id number]) -------------------------------------------------------------------------------- Physical Exam Details Patient Name: Alex Green. Date of Service: 02/15/2021 2:00 PM Medical Record Number: [id number] Patient Account Number: [id number] Date of Birth/Sex: March 08, 1966 (55 y.o. M) Treating RN: Rogers Blocker Primary Care Provider: Saralyn Pilar Other Clinician: Referring Provider: Saralyn Pilar Treating Provider/Extender: Rowan Blase in Treatment: 4 Constitutional Well-nourished and well-hydrated in no acute distress. Respiratory normal breathing without difficulty. Psychiatric this patient is able to make decisions and demonstrates good insight into disease process. Alert and Oriented x 3. pleasant and cooperative. Notes Upon inspection patient's wound bed actually showed signs of good granulation epithelization at this point. Fortunately there does not appear to be any signs of active infection which is great news and overall I am extremely pleased with where everything stands at this point. He is extremely pleased as well with how things appear. Electronic Signature(s) Signed: 02/15/2021 3:23:14 PM By: Lenda Kelp PA-C Entered By: Lenda Kelp  on 02/15/2021 15:23:14 Alex Green ([id number]) -------------------------------------------------------------------------------- Physician Orders Details Patient Name: Alex Green Date of Service: 02/15/2021 2:00 PM Medical Record Number: [id number] Patient Account Number: [id number] Date of Birth/Sex: 1965/11/25 (55 y.o. M) Treating RN: Rogers Blocker Primary Care Provider: [name],  Alexander Other Clinician: Referring Provider: Saralyn PilarKaramalegos, Alexander Treating Provider/Extender: Rowan BlaseStone, Kesley Mullens Weeks in Treatment: 4 Verbal / Phone Orders: No Diagnosis Coding ICD-10 Coding Code Description I87.2 Venous insufficiency (chronic) (peripheral) L97.322 Non-pressure chronic ulcer of left ankle with fat layer exposed E11.621 Type 2 diabetes mellitus with foot ulcer Follow-up Appointments o Return Appointment in 2 weeks. Bathing/ Shower/ Hygiene o May shower; gently cleanse wound with antibacterial soap, rinse and pat dry prior to dressing wounds Wound Treatment Wound #1 - Malleolus Wound Laterality: Left, Lateral Cleanser: Soap and Water 3 x Per Week/30 Days Discharge Instructions: Gently cleanse wound with antibacterial soap, rinse and pat dry prior to dressing wounds Primary Dressing: Hydrofera Blue Ready Transfer Foam, 2.5x2.5 (in/in) (Generic) 3 x Per Week/30 Days Discharge Instructions: Apply Hydrofera Blue Ready to wound bed as directed Secondary Dressing: ABD Pad 5x9 (in/in) 3 x Per Week/30 Days Discharge Instructions: Cover with ABD pad Secondary Dressing: Conforming Guaze Roll-Large 3 x Per Week/30 Days Discharge Instructions: Apply Conforming Stretch Guaze Bandage as directed Secured With: 42M Medipore H Soft Cloth Surgical Tape, 2x2 (in/yd) 3 x Per Week/30 Days Discharge Instructions: Secure gauze Electronic Signature(s) Signed: 02/15/2021 4:48:21 PM By: Rogers BlockerSanchez , Kenia RN Signed: 02/15/2021 5:15:45 PM By: Lenda KelpStone III, Miriya Cloer PA-C Entered By: Rogers BlockerSanchez , Kenia on 02/15/2021  14:50:51 Alex Green, Alex C. ([id number]) -------------------------------------------------------------------------------- Problem List Details Patient Name: Alex CheeksKASSAY, Alex C. Date of Service: 02/15/2021 2:00 PM Medical Record Number: [id number] Patient Account Number: [id number] Date of Birth/Sex: 06/06/1966 (55 y.o. M) Treating RN: Rogers BlockerSanchez, Kenia Primary Care Provider: Saralyn PilarKaramalegos, Alexander Other Clinician: Referring Provider: Saralyn PilarKaramalegos, Alexander Treating Provider/Extender: Rowan BlaseStone, Teegan Guinther Weeks in Treatment: 4 Active Problems ICD-10 Encounter Code Description Active Date MDM Diagnosis I87.2 Venous insufficiency (chronic) (peripheral) 01/18/2021 No Yes L97.322 Non-pressure chronic ulcer of left ankle with fat layer exposed 01/18/2021 No Yes E11.621 Type 2 diabetes mellitus with foot ulcer 01/18/2021 No Yes Inactive Problems Resolved Problems Electronic Signature(s) Signed: 02/15/2021 2:19:09 PM By: Lenda KelpStone III, Jobe Mutch PA-C Entered By: Lenda KelpStone III, Alaylah Heatherington on 02/15/2021 14:19:09 Alex Green, Alex C. ([id number]) -------------------------------------------------------------------------------- Progress Note Details Patient Name: Alex DoneKASSAY, Alex C. Date of Service: 02/15/2021 2:00 PM Medical Record Number: [id number] Patient Account Number: [id number] Date of Birth/Sex: 08/14/1965 (55 y.o. M) Treating RN: Rogers BlockerSanchez, Kenia Primary Care Provider: Saralyn PilarKaramalegos, Alexander Other Clinician: Referring Provider: Saralyn PilarKaramalegos, Alexander Treating Provider/Extender: Rowan BlaseStone, Valene Villa Weeks in Treatment: 4 Subjective Chief Complaint Information obtained from Patient Left ankle ulcers History of Present Illness (HPI) 01/18/21 upon evaluation today patient presents for initial inspection here in the clinic concerning issues that he's been having with wounds which are venous in nature of the left lateral malleolus and left medial malleolus. Medial is actually not doing too poorly overall which is great news. In fact this is an  area that he just scratched which calls the wound. Collateral malleolus actually appears to show some signs of hyper granulation and is not doing quite as well. Overall I think that the patient would benefit from [facility] Dressing's a dressing of choice. I think this would help with both sides but especially the lateral portion. He does have a history of diabetes mellitus type II in his most recent hemoglobin A1c was 5.6. Otherwise he does have a history of chronic venous insufficiency although he doesn't appear to be too swollen right now which is great news. 02/15/2021 upon evaluation today patient appears to be doing well currently in regard to his wounds. He is actually been tolerating the dressing changes without complication. Fortunately there does not  appear to be any evidence of infection which is great and overall other than the fact we have seen him for about a month he has Green all some when the wound is healed the other is much smaller. Objective Constitutional Well-nourished and well-hydrated in no acute distress. Vitals Time Taken: 2:27 PM, Height: 68 in, Weight: 179 lbs, BMI: 27.2, Pulse: 77 bpm, Respiratory Rate: 18 breaths/min, Blood Pressure: 134/84 mmHg, Capillary Blood Glucose: 146 mg/dl. Respiratory normal breathing without difficulty. Psychiatric this patient is able to make decisions and demonstrates good insight into disease process. Alert and Oriented x 3. pleasant and cooperative. General Notes: Upon inspection patient's wound bed actually showed signs of good granulation epithelization at this point. Fortunately there does not appear to be any signs of active infection which is great news and overall I am extremely pleased with where everything stands at this point. He is extremely pleased as well with how things appear. Integumentary (Hair, Skin) Wound #1 status is Open. Original cause of wound was Gradually Appeared. The date acquired was: 12/18/2020. The wound has  been in treatment 4 weeks. The wound is located on the Left,Lateral Malleolus. The wound measures 0.5cm length x 0.1cm width x 0.1cm depth; 0.039cm^2 area and 0.004cm^3 volume. There is Fat Layer (Subcutaneous Tissue) exposed. There is no tunneling or undermining noted. There is a medium amount of sanguinous drainage noted. There is large (67-100%) red granulation within the wound bed. There is no necrotic tissue within the wound bed. Wound #2 status is Open. Original cause of wound was Gradually Appeared. The date acquired was: 12/18/2020. The wound has been in treatment 4 weeks. The wound is located on the Left,Medial Ankle. The wound measures 0cm length x 0cm width x 0cm depth; 0cm^2 area and 0cm^3 volume. There is no tunneling or undermining noted. There is a none present amount of drainage noted. There is no granulation within the wound bed. There is no necrotic tissue within the wound bed. Assessment Alex Green, Alex C. ([id number]) Active Problems ICD-10 Venous insufficiency (chronic) (peripheral) Non-pressure chronic ulcer of left ankle with fat layer exposed Type 2 diabetes mellitus with foot ulcer Plan Follow-up Appointments: Return Appointment in 2 weeks. Bathing/ Shower/ Hygiene: May shower; gently cleanse wound with antibacterial soap, rinse and pat dry prior to dressing wounds WOUND #1: - Malleolus Wound Laterality: Left, Lateral Cleanser: Soap and Water 3 x Per Week/30 Days Discharge Instructions: Gently cleanse wound with antibacterial soap, rinse and pat dry prior to dressing wounds Primary Dressing: Hydrofera Blue Ready Transfer Foam, 2.5x2.5 (in/in) (Generic) 3 x Per Week/30 Days Discharge Instructions: Apply Hydrofera Blue Ready to wound bed as directed Secondary Dressing: ABD Pad 5x9 (in/in) 3 x Per Week/30 Days Discharge Instructions: Cover with ABD pad Secondary Dressing: Conforming Guaze Roll-Large 3 x Per Week/30 Days Discharge Instructions: Apply Conforming Stretch  Guaze Bandage as directed Secured With: 41M Medipore H Soft Cloth Surgical Tape, 2x2 (in/yd) 3 x Per Week/30 Days Discharge Instructions: Secure gauze 1. Would recommend currently that we going continue with wound care measures as before and the patient is in agreement with the plan this includes the use of the [facility] which I think is doing a great job. 2. Also can recommend that we have the patient continue to elevate his legs much as possible. Obviously is not swelling too much but 1 to keep a close eye on things as far as that is concerned. We will see patient back for reevaluation in 2 weeks here in the  clinic. If anything worsens or changes patient will contact our office for additional recommendations. If he is healed before that time he wants to call and let us know so we enclose things out we can definitely do that he just needs to make sure to call us. Electronic Signature(s) Signed: 02/15/2021 3:24:25 PM By: Lenda Kelp PA-C Entered By: Lenda Kelp on 02/15/2021 15:24:25 Alex Green ([id number]) -------------------------------------------------------------------------------- SuperBill Details Patient Name: Alex Green Date of Service: 02/15/2021 Medical Record Number: [id number] Patient Account Number: [id number] Date of Birth/Sex: 11/12/1965 (55 y.o. M) Treating RN: Rogers Blocker Primary Care Provider: Saralyn Pilar Other Clinician: Referring Provider: Saralyn Pilar Treating Provider/Extender: Rowan Blase in Treatment: 4 Diagnosis Coding ICD-10 Codes Code Description I87.2 Venous insufficiency (chronic) (peripheral) L97.322 Non-pressure chronic ulcer of left ankle with fat layer exposed E11.621 Type 2 diabetes mellitus with foot ulcer Facility Procedures CPT4 Code: [id number] Description: 99213 - WOUND CARE VISIT-LEV 3 EST PT Modifier: Quantity: 1 Physician Procedures CPT4 Code: [id number] Description: 99213 - WC PHYS LEVEL 3 -  EST PT Modifier: Quantity: 1 CPT4 Code: Description: ICD-10 Diagnosis Description I87.2 Venous insufficiency (chronic) (peripheral) L97.322 Non-pressure chronic ulcer of left ankle with fat layer exposed E11.621 Type 2 diabetes mellitus with foot ulcer Modifier: Quantity: Electronic Signature(s) Signed: 02/15/2021 3:24:36 PM By: Lenda Kelp PA-C Entered By: Lenda Kelp on 02/15/2021 15:24:36

## 2021-02-15 NOTE — Progress Notes (Addendum)
Green, Alex ([id number]) Visit Report for 02/15/2021 Arrival Information Details Patient Name: Alex Green, Alex Green. Date of Service: 02/15/2021 2:00 PM Medical Record Number: [id number] Patient Account Number: [id number] Date of Birth/Sex: 1965-08-03 (55 y.o. M) Treating RN: Rogers Blocker Primary Care Jasiah Buntin: Saralyn Pilar Other Clinician: Referring Angeligue Bowne: Saralyn Pilar Treating Amorette Charrette/Extender: Rowan Blase in Treatment: 4 Visit Information History Since Last Visit Pain Present Now: No Patient Arrived: Cane Arrival Time: 14:26 Accompanied By: self Transfer Assistance: None Patient Identification Verified: Yes Secondary Verification Process Completed: Yes Electronic Signature(s) Signed: 02/15/2021 4:48:21 PM By: Rogers Blocker RN Entered By: Rogers Blocker on 02/15/2021 14:27:12 Alex Green ([id number]) -------------------------------------------------------------------------------- Clinic Level of Care Assessment Details Patient Name: Alex Green Date of Service: 02/15/2021 2:00 PM Medical Record Number: [id number] Patient Account Number: [id number] Date of Birth/Sex: September 17, 1965 (55 y.o. M) Treating RN: Rogers Blocker Primary Care Tiffinie Caillier: Saralyn Pilar Other Clinician: Referring Nayely Dingus: Saralyn Pilar Treating Brekyn Huntoon/Extender: Rowan Blase in Treatment: 4 Clinic Level of Care Assessment Items TOOL 4 Quantity Score X - Use when only an EandM is performed on FOLLOW-UP visit 1 0 ASSESSMENTS - Nursing Assessment / Reassessment X - Reassessment of Co-morbidities (includes updates in patient status) 1 10 X- 1 5 Reassessment of Adherence to Treatment Plan ASSESSMENTS - Wound and Skin Assessment / Reassessment X - Simple Wound Assessment / Reassessment - one wound 1 5 []  - 0 Complex Wound Assessment / Reassessment - multiple wounds []  - 0 Dermatologic / Skin Assessment (not related to wound area) ASSESSMENTS -  Focused Assessment []  - Circumferential Edema Measurements - multi extremities 0 []  - 0 Nutritional Assessment / Counseling / Intervention []  - 0 Lower Extremity Assessment (monofilament, tuning fork, pulses) []  - 0 Peripheral Arterial Disease Assessment (using hand held doppler) ASSESSMENTS - Ostomy and/or Continence Assessment and Care []  - Incontinence Assessment and Management 0 []  - 0 Ostomy Care Assessment and Management (repouching, etc.) PROCESS - Coordination of Care X - Simple Patient / Family Education for ongoing care 1 15 []  - 0 Complex (extensive) Patient / Family Education for ongoing care []  - 0 Staff obtains , Records, Test Results / Process Orders []  - 0 Staff telephones HHA, Nursing Homes / Clarify orders / etc []  - 0 Routine Transfer to another Facility (non-emergent condition) []  - 0 Routine Hospital Admission (non-emergent condition) []  - 0 New Admissions / / Ordering NPWT, Apligraf, etc. []  - 0 Emergency Hospital Admission (emergent condition) X- 1 10 Simple Discharge Coordination []  - 0 Complex (extensive) Discharge Coordination PROCESS - Special Needs []  - Pediatric / Minor Patient Management 0 []  - 0 Isolation Patient Management []  - 0 Hearing / Language / Visual special needs []  - 0 Assessment of Community assistance (transportation, D/C planning, etc.) []  - 0 Additional assistance / Altered mentation []  - 0 Support Surface(s) Assessment (bed, cushion, seat, etc.) INTERVENTIONS - Wound Cleansing / Measurement Kant, Yitzhak C. ( ) X- 1 5 Simple Wound Cleansing - one wound []  - 0 Complex Wound Cleansing - multiple wounds X- 1 5 Wound Imaging (photographs - any number of wounds) []  - 0 Wound Tracing (instead of photographs) X- 1 5 Simple Wound Measurement - one wound []  - 0 Complex Wound Measurement - multiple wounds INTERVENTIONS - Wound Dressings []  - Small Wound Dressing one or multiple  wounds 0 X- 1 15 Medium Wound Dressing one or multiple wounds []  - 0 Large Wound Dressing one or multiple wounds []  - 0 Application  of Medications - topical []  - 0 Application of Medications - injection INTERVENTIONS - Miscellaneous []  - External ear exam 0 []  - 0 Specimen Collection (cultures, biopsies, blood, body fluids, etc.) []  - 0 Specimen(s) / Culture(s) sent or taken to Lab for analysis []  - 0 Patient Transfer (multiple staff / Nurse, adultHoyer Lift / Similar devices) []  - 0 Simple Staple / Suture removal (25 or less) []  - 0 Complex Staple / Suture removal (26 or more) []  - 0 Hypo / Hyperglycemic Management (close monitor of Blood Glucose) []  - 0 Ankle / Brachial Index (ABI) - do not check if billed separately X- 1 5 Vital Signs Has the patient been seen at the hospital within the last three years: Yes Total Score: 80 Level Of Care: New/Established - Level 3 Electronic Signature(s) Signed: 02/15/2021 4:48:21 PM By: Rogers BlockerSanchez , Kenia RN Entered By: Rogers BlockerSanchez , Kenia on 02/15/2021 15:00:05 Alex Green, Torre C. ([id number]) -------------------------------------------------------------------------------- Encounter Discharge Information Details Patient Name: Alex Green, Alex C. Date of Service: 02/15/2021 2:00 PM Medical Record Number: [id number] Patient Account Number: [id number] Date of Birth/Sex: 06/15/1966 (55 y.o. M) Treating RN: Rogers BlockerSanchez, Kenia Primary Care Malikhi Ogan: Saralyn PilarKaramalegos, Alexander Other Clinician: Referring Sonoma Firkus: Saralyn PilarKaramalegos, Alexander Treating Ceaira Ernster/Extender: Rowan BlaseStone, Hoyt Weeks in Treatment: 4 Encounter Discharge Information Items Discharge Condition: Stable Ambulatory Status: Cane Discharge Destination: Home Transportation: Private Auto Accompanied By: self Schedule Follow-up Appointment: Yes Clinical Summary of Care: Electronic Signature(s) Signed: 02/15/2021 4:48:21 PM By: Rogers BlockerSanchez , Kenia RN Entered By: Rogers BlockerSanchez , Kenia on 02/15/2021 15:00:45 Alex Green, [name]  ([id number]) -------------------------------------------------------------------------------- Lower Extremity Assessment Details Patient Name: Alex Green, Seith C. Date of Service: 02/15/2021 2:00 PM Medical Record Number: [id number] Patient Account Number: [id number] Date of Birth/Sex: 03/20/1966 (55 y.o. M) Treating RN: Rogers BlockerSanchez, Kenia Primary Care Reyonna Haack: Saralyn PilarKaramalegos, Alexander Other Clinician: Referring Shemicka Cohrs: Saralyn PilarKaramalegos, Alexander Treating Montana Bryngelson/Extender: Allen DerryStone, Hoyt Weeks in Treatment: 4 Edema Assessment Assessed: Kyra Searles[Left: Yes] [Right: No] Edema: [Left: N] [Right: o] Vascular Assessment Pulses: Dorsalis Pedis Palpable: [Left:Yes] Electronic Signature(s) Signed: 02/15/2021 4:48:21 PM By: Rogers BlockerSanchez , Kenia RN Entered By: Rogers BlockerSanchez , Kenia on 02/15/2021 14:37:03 Melching, Leonel C. ([id number]) -------------------------------------------------------------------------------- Multi Wound Chart Details Patient Name: Alex Green, Severn C. Date of Service: 02/15/2021 2:00 PM Medical Record Number: [id number] Patient Account Number: [id number] Date of Birth/Sex: 07/31/1965 (55 y.o. M) Treating RN: Rogers BlockerSanchez, Kenia Primary Care Jarrel Knoke: Saralyn PilarKaramalegos, Alexander Other Clinician: Referring Ariahna Smiddy: Saralyn PilarKaramalegos, Alexander Treating Jenene Kauffmann/Extender: Rowan BlaseStone, Hoyt Weeks in Treatment: 4 Vital Signs Height(in): 68 Capillary Blood Glucose 146 (mg/dl): Weight(lbs): 742179 Pulse(bpm): 77 Body Mass Index(BMI): 27 Blood Pressure(mmHg): 134/84 Temperature(F): Respiratory Rate(breaths/min): 18 Photos: [N/A:N/A] Wound Location: Left, Lateral Malleolus Left, Medial Ankle N/A Wounding Event: Gradually Appeared Gradually Appeared N/A Primary Etiology: Venous Leg Ulcer Venous Leg Ulcer N/A Comorbid History: Type II Diabetes, Neuropathy Type II Diabetes, Neuropathy N/A Date Acquired: 12/18/2020 12/18/2020 N/A Weeks of Treatment: 4 4 N/A Wound Status: Open Open N/A Measurements L x W x D (cm) 0.5x0.1x0.1 0x0x0  N/A Area (cm) : 0.039 0 N/A Volume (cm) : 0.004 0 N/A % Reduction in Area: 99.50% 100.00% N/A % Reduction in Volume: 99.50% 100.00% N/A Classification: Full Thickness Without Exposed Full Thickness Without Exposed N/A Support Structures Support Structures Exudate Amount: Medium None Present N/A Exudate Type: Sanguinous N/A N/A Exudate Color: red N/A N/A Granulation Amount: Large (67-100%) None Present (0%) N/A Granulation Quality: Red N/A N/A Necrotic Amount: None Present (0%) None Present (0%) N/A Exposed Structures: Fat Layer (Subcutaneous Tissue): Fascia: No N/A Yes Fat Layer (Subcutaneous Tissue): Fascia: No No Tendon: No  Tendon: No Muscle: No Muscle: No Joint: No Joint: No Bone: No Bone: No Epithelialization: Large (67-100%) Large (67-100%) N/A Treatment Notes Electronic Signature(s) Signed: 02/15/2021 4:48:21 PM By: Rogers Blocker RN Entered By: Rogers Blocker on 02/15/2021 14:49:40 Alex Green ([id number]) -------------------------------------------------------------------------------- Multi-Disciplinary Care Plan Details Patient Name: Alex Green. Date of Service: 02/15/2021 2:00 PM Medical Record Number: [id number] Patient Account Number: [id number] Date of Birth/Sex: 1965/07/31 (55 y.o. M) Treating RN: Rogers Blocker Primary Care Mahmud Keithly: Saralyn Pilar Other Clinician: Referring Telisha Zawadzki: Saralyn Pilar Treating Brody Bonneau/Extender: Rowan Blase in Treatment: 4 Active Inactive Electronic Signature(s) Signed: 03/21/2021 1:14:42 PM By: Elliot Gurney, BSN, RN, CWS, Kim RN, BSN Signed: 04/06/2021 5:06:58 PM By: Rogers Blocker RN Previous Signature: 02/15/2021 4:48:21 PM Version By: Rogers Blocker RN Entered By: Elliot Gurney BSN, RN, CWS, Kim on 03/21/2021 13:14:42 Bartling, Abbe [place] ([id number]) -------------------------------------------------------------------------------- Pain Assessment Details Patient Name: ANDRA, MATSUO C. Date of  Service: 02/15/2021 2:00 PM Medical Record Number: [id number] Patient Account Number: [id number] Date of Birth/Sex: 21-May-1966 (55 y.o. M) Treating RN: Rogers Blocker Primary Care Brynlea Spindler: Saralyn Pilar Other Clinician: Referring Marcelino Campos: Saralyn Pilar Treating Murna Backer/Extender: Rowan Blase in Treatment: 4 Active Problems Location of Pain Severity and Description of Pain Patient Has Paino No Site Locations Rate the pain. Current Pain Level: 0 Pain Management and Medication Current Pain Management: Electronic Signature(s) Signed: 02/15/2021 4:48:21 PM By: Rogers Blocker RN Entered By: Rogers Blocker on 02/15/2021 14:29:32 Alex Green ([id number]) -------------------------------------------------------------------------------- Patient/Caregiver Education Details Patient Name: Alex Green Date of Service: 02/15/2021 2:00 PM Medical Record Number: [id number] Patient Account Number: [id number] Date of Birth/Gender: 18-Jan-1966 (55 y.o. M) Treating RN: Rogers Blocker Primary Care Physician: Saralyn Pilar Other Clinician: Referring Physician: Saralyn Pilar Treating Physician/Extender: Rowan Blase in Treatment: 4 Education Assessment Education Provided To: Patient Education Topics Provided Wound/Skin Impairment: Methods: Explain/Verbal Responses: State content correctly Electronic Signature(s) Signed: 02/15/2021 4:48:21 PM By: Rogers Blocker RN Entered By: Rogers Blocker on 02/15/2021 15:00:19 Alex Green ([id number]) -------------------------------------------------------------------------------- Wound Assessment Details Patient Name: Alex Green. Date of Service: 02/15/2021 2:00 PM Medical Record Number: [id number] Patient Account Number: [id number] Date of Birth/Sex: 07/04/1965 (55 y.o. M) Treating RN: Rogers Blocker Primary Care Kamiah Fite: Saralyn Pilar Other Clinician: Referring Steffani Dionisio: Saralyn Pilar Treating Roshonda Sperl/Extender: Rowan Blase in Treatment: 4 Wound Status Wound Number: 1 Primary Etiology: Venous Leg Ulcer Wound Location: Left, Lateral Malleolus Wound Status: Healed - Epithelialized Wounding Event: Gradually Appeared Comorbid History: Type II Diabetes, Neuropathy Date Acquired: 12/18/2020 Weeks Of Treatment: 4 Clustered Wound: No Photos Wound Measurements Length: (cm) Width: (cm) Depth: (cm) Area: (cm) Volume: (cm) 0 % Reduction in Area: 99.5% 0 % Reduction in Volume: 99.5% 0 Epithelialization: Large (67-100%) 0 Tunneling: No 0 Undermining: No Wound Description Classification: Full Thickness Without Exposed Support Structu Exudate Amount: Medium Exudate Type: Sanguinous Exudate Color: red res Foul Odor After Cleansing: No Slough/Fibrino No Wound Bed Granulation Amount: Large (67-100%) Exposed Structure Granulation Quality: Red Fascia Exposed: No Necrotic Amount: None Present (0%) Fat Layer (Subcutaneous Tissue) Exposed: Yes Tendon Exposed: No Muscle Exposed: No Joint Exposed: No Bone Exposed: No Treatment Notes Wound #1 (Malleolus) Wound Laterality: Left, Lateral Cleanser Soap and Water Discharge Instruction: Gently cleanse wound with antibacterial soap, rinse and pat dry prior to dressing wounds Peri-Wound Care IZAIYAH, KLEINMAN C. ([id number]) Topical Primary Dressing Hydrofera Blue Ready Transfer Foam, 2.5x2.5 (in/in) Discharge Instruction: Apply Hydrofera Blue Ready to wound bed as directed Secondary Dressing ABD Pad  5x9 (in/in) Discharge Instruction: Cover with ABD pad Conforming Guaze Roll-Large Discharge Instruction: Apply Conforming Stretch Guaze Bandage as directed Secured With 59M Medipore H Soft Cloth Surgical Tape, 2x2 (in/yd) Discharge Instruction: Secure gauze Compression Wrap Compression Stockings Add-Ons Electronic Signature(s) Signed: 03/21/2021 1:14:04 PM By: Elliot Gurney, BSN, RN, CWS, Kim RN, BSN Signed:  04/06/2021 5:06:58 PM By: Rogers Blocker RN Previous Signature: 02/15/2021 4:48:21 PM Version By: Rogers Blocker RN Entered By: Elliot Gurney, BSN, RN, CWS, Kim on 03/21/2021 13:14:04 Alex Green ([id number]) -------------------------------------------------------------------------------- Wound Assessment Details Patient Name: PREET, PERRIER C. Date of Service: 02/15/2021 2:00 PM Medical Record Number: [id number] Patient Account Number: [id number] Date of Birth/Sex: 1965-08-04 (55 y.o. M) Treating RN: Rogers Blocker Primary Care Lafaye Mcelmurry: Saralyn Pilar Other Clinician: Referring Donnae Michels: Saralyn Pilar Treating Darreon Lutes/Extender: Rowan Blase in Treatment: 4 Wound Status Wound Number: 2 Primary Etiology: Venous Leg Ulcer Wound Location: Left, Medial Ankle Wound Status: Open Wounding Event: Gradually Appeared Comorbid History: Type II Diabetes, Neuropathy Date Acquired: 12/18/2020 Weeks Of Treatment: 4 Clustered Wound: No Photos Wound Measurements Length: (cm) 0 Width: (cm) 0 Depth: (cm) 0 Area: (cm) 0 Volume: (cm) 0 % Reduction in Area: 100% % Reduction in Volume: 100% Epithelialization: Large (67-100%) Tunneling: No Undermining: No Wound Description Classification: Full Thickness Without Exposed Support Structure Exudate Amount: None Present s Foul Odor After Cleansing: No Slough/Fibrino No Wound Bed Granulation Amount: None Present (0%) Exposed Structure Necrotic Amount: None Present (0%) Fascia Exposed: No Fat Layer (Subcutaneous Tissue) Exposed: No Tendon Exposed: No Muscle Exposed: No Joint Exposed: No Bone Exposed: No Electronic Signature(s) Signed: 02/15/2021 4:48:21 PM By: Rogers Blocker RN Entered By: Rogers Blocker on 02/15/2021 14:36:49 Rhoda, Abbe [place] ([id number]) -------------------------------------------------------------------------------- Vitals Details Patient Name: Alex Green. Date of Service: 02/15/2021 2:00  PM Medical Record Number: [id number] Patient Account Number: [id number] Date of Birth/Sex: [birth year] (55 y.o. M) Treating RN: Rogers Blocker Primary Care Kemond Amorin: Saralyn Pilar Other Clinician: Referring Timmey Lamba: Saralyn Pilar Treating Jakori Burkett/Extender: Rowan Blase in Treatment: 4 Vital Signs Time Taken: 14:27 Pulse (bpm): 77 Height (in): 68 Respiratory Rate (breaths/min): 18 Weight (lbs): 179 Blood Pressure (mmHg): 134/84 Body Mass Index (BMI): 27.2 Capillary Blood Glucose (mg/dl): 673 Reference Range: 80 - 120 mg / dl Electronic Signature(s) Signed: 02/15/2021 4:48:21 PM By: Rogers Blocker RN Entered By: Rogers Blocker on 02/15/2021 14:29:23

## 2021-02-16 NOTE — Chronic Care Management (AMB) (Signed)
  Care Management   Note  02/16/2021 Name: [name] MRN: [id number] DOB: 1965-07-30  Alex Green is a 55 y.o. year old male who is a primary care patient of [name], DO and is actively engaged with the care management team. I reached out to Alex Green by phone today to assist with re-scheduling an initial visit with the RN Case Manager, Pharmacist, and Licensed Clinical Social Worker  Follow up plan: Patient declines further follow up and engagement by the care management team. Appropriate care team members and provider have been notified via electronic communication.   Penne Lash, RMA Care Guide, Embedded Care Coordination [facility]  [place], [place] [address] Direct Dial: [phone] [email or web address]

## 2021-02-16 NOTE — Telephone Encounter (Signed)
Patient declined rescheduling  

## 2021-03-01 ENCOUNTER — Ambulatory Visit: Payer: Medicare Other | Admitting: Physician Assistant

## 2021-03-12 ENCOUNTER — Telehealth: Payer: Self-pay | Admitting: Family Medicine

## 2021-03-12 NOTE — Telephone Encounter (Signed)
Left message for patient to call back and schedule Medicare Annual Wellness Visit (AWV) to be done virtually or by telephone. ? ?No hx of AWV eligible as of 09/16/18 ? ?Please schedule at anytime with [facility]-Nurse Health Advisor.     ? ?40 Minutes appointment  ? ?Any questions, please call me at [phone]  ?

## 2021-06-19 ENCOUNTER — Ambulatory Visit: Payer: Medicare Other | Admitting: Family Medicine

## 2021-06-26 ENCOUNTER — Encounter: Payer: Self-pay | Admitting: Family Medicine

## 2021-06-26 ENCOUNTER — Ambulatory Visit (INDEPENDENT_AMBULATORY_CARE_PROVIDER_SITE_OTHER): Payer: Medicare Other | Admitting: Family Medicine

## 2021-06-26 ENCOUNTER — Other Ambulatory Visit: Payer: Self-pay

## 2021-06-26 VITALS — BP 137/65 | HR 60 | Ht 68.0 in | Wt 170.0 lb

## 2021-06-26 DIAGNOSIS — F1020 Alcohol dependence, uncomplicated: Secondary | ICD-10-CM | POA: Diagnosis not present

## 2021-06-26 DIAGNOSIS — F331 Major depressive disorder, recurrent, moderate: Secondary | ICD-10-CM

## 2021-06-26 DIAGNOSIS — E1140 Type 2 diabetes mellitus with diabetic neuropathy, unspecified: Secondary | ICD-10-CM | POA: Diagnosis not present

## 2021-06-26 LAB — POCT GLYCOSYLATED HEMOGLOBIN (HGB A1C): Hemoglobin A1C: 5.3 % (ref 4.0–5.6)

## 2021-06-26 NOTE — Assessment & Plan Note (Signed)
Well-controlled DM previous A1c 5.3% improved today Complications - peripheral neuropathy, hyperlipidemia, GERD, depression, obesity-  increases risk of future cardiovascular complications   Plan:  1. Refill Trulicity 1.5mg  weekly 2. Encourage improved lifestyle - low carb, low sugar diet, reduce portion size, continue improving regular exercise 3. Check CBG , bring log to next visit for review 4. Continue ARB, future consider statin  Discussed neuropathy again, he has tried PT, Neurology, he is on max dose Lyrica, Duloxetine. He asks about pain medication and referral.

## 2021-06-26 NOTE — Progress Notes (Signed)
Subjective:    Patient ID: Alex Green, male    DOB: [birth year], 56 y.o.   MRN: [id number]  Alex Green is a 56 y.o. male presenting on 06/26/2021 for Diabetes and Peripheral Neuropathy   HPI  CHRONIC DM, Type 2, with neuropathy HTN Reports history of initial dx DM >6 years ago, with A1c 12.1 in past Improved A1c control prior range 5.5 to 5.9% now due today for A1c CBGs: Has glucometer again, no detailed report today. Meds: Trulicity 1.5mg  weekly - Off Metformin and Amaryl Reports good compliance. Tolerating well w/o side-effects Currently on ARB  Lifestyle: - Diet (improving) Exercise (limited due to neuropathy) - Admits chronic neuropathy - he takes Lyrica 150mg  4 times daily. He is unable to maintain balance. Has seen [facility] Dr [facility] for Neurology Eye doctor is in [place], and establishing with local Eye Doctor has not scheduled due to transportation Denies hypoglycemia, polyuria, visual changes, numbness or tingling.   Chronic problem with Balance / Neuropathy He had complicated history with fall head injury, has seen Dr [place] [facility] Also issues with balance, complication as well with neuropathy Previously lost referral to [facility] PT he has transportation limitation He has orders for [facility] PT, awaiting on this. On Lyrica, Duloxetine. Concerns with chronic pain management, interested in future referral  Major Depression, recurrent Moderate partial remission On Medication Duloxetine, doing well.   Left Ankle Skin Ulceration =- resolved  Health Maintenance: He agrees to do Cologuard in future.  Depression screen [facility] 2/9 06/26/2021 02/14/2021 07/31/2020  Decreased Interest 1 1 1   Down, Depressed, Hopeless 1 1 1   PHQ - 2 Score 2 2 2   Altered sleeping 1 1 1   Tired, decreased energy 3 3 3   Change in appetite 1 1 1   Feeling bad or failure about yourself  1 1 1   Trouble concentrating 0 1 1  Moving slowly or fidgety/restless 0 0 0  Suicidal thoughts 0 0 0   PHQ-9 Score 8 9 9   Difficult doing work/chores Not difficult at all Somewhat difficult Somewhat difficult    Social History   Tobacco Use   Smoking status: Every Day    Packs/day: 1.50    Years: 10.00    Pack years: 15.00    Types: Cigarettes   Smokeless tobacco: Never  Substance Use Topics   Alcohol use: Yes    Alcohol/week: 50.0 standard drinks    Types: 50 Cans of beer per week   Drug use: Not Currently    Review of Systems Per HPI unless specifically indicated above     Objective:    BP 137/65    Pulse 60    Ht 5\' 8"  (1.727 m)    Wt 170 lb (77.1 kg)    SpO2 98%    BMI 25.85 kg/m   Wt Readings from Last 3 Encounters:  06/26/21 170 lb (77.1 kg)  02/14/21 170 lb (77.1 kg)  12/26/20 171 lb (77.6 kg)    Physical Exam Vitals and nursing note reviewed.  Constitutional:      General: He is not in acute distress.    Appearance: Normal appearance. He is well-developed. He is not diaphoretic.     Comments: Well-appearing, comfortable, cooperative  HENT:     Head: Normocephalic and atraumatic.  Eyes:     General:        Right eye: No discharge.        Left eye: No discharge.     Conjunctiva/sclera: Conjunctivae normal.  Cardiovascular:     Rate and Rhythm: Normal rate.  Pulmonary:     Effort: Pulmonary effort is normal.  Skin:    General: Skin is warm and dry.     Findings: No erythema or rash.  Neurological:     Mental Status: He is alert and oriented to person, place, and time.  Psychiatric:        Mood and Affect: Mood normal.        Behavior: Behavior normal.        Thought Content: Thought content normal.     Comments: Well groomed, good eye contact, normal speech and thoughts    Recent Labs    07/31/20 1125 02/14/21 1340 06/26/21 1457  HGBA1C 5.9* 5.5 5.3    Results for orders placed or performed in visit on 06/26/21  POCT glycosylated hemoglobin (Hb A1C)  Result Value Ref Range   Hemoglobin A1C 5.3 4.0 - 5.6 %      Assessment & Plan:    Problem List Items Addressed This Visit     Major depressive disorder, recurrent, moderate (HCC)    Controlled major depression on medication SNRI      Controlled type 2 diabetes mellitus with neuropathy (HCC) - Primary    Well-controlled DM previous A1c 5.3% improved today Complications - peripheral neuropathy, hyperlipidemia, GERD, depression, obesity-  increases risk of future cardiovascular complications   Plan:  1. Refill Trulicity 1.5mg  weekly 2. Encourage improved lifestyle - low carb, low sugar diet, reduce portion size, continue improving regular exercise 3. Check CBG , bring log to next visit for review 4. Continue ARB, future consider statin  Discussed neuropathy again, he has tried PT, Neurology, he is on max dose Lyrica, Duloxetine. He asks about pain medication and referral.      Relevant Orders   POCT glycosylated hemoglobin (Hb A1C) (Completed)   Alcohol dependence (HCC)    Stable chronic problem without symptoms of withdrawal Never history of complicated withdrawal or hospitalization Counseling on cessation       #Neuropathy / Chronic Pain Discussion today on neuropathy / pain management options, handout given AVS with multiple local / regional pain clinics Advised that there are limited options now I can provide, he is on max dose Lyrica, and on Duloxetine 60mg  He would benefit from interventional pain management procedural / nerve blocks / injections / and medication management.  No orders of the defined types were placed in this encounter.   Follow up plan: Return in about 6 months (around 12/24/2021) for 6 month fasting lab only then 1 week later Follow-up DM, neuropathy.  Future labs ordered for 12/2021   01/2022, DO [facility] 06/26/2021, 2:55 PM

## 2021-06-26 NOTE — Assessment & Plan Note (Signed)
Controlled major depression on medication SNRI

## 2021-06-26 NOTE — Assessment & Plan Note (Signed)
Stable chronic problem without symptoms of withdrawal Never history of complicated withdrawal or hospitalization Counseling on cessation 

## 2021-06-26 NOTE — Patient Instructions (Addendum)
Thank you for coming to the office today.  [place], [place]  Phone: [phone] Fax: [phone]  Address  [place] [address]   Operation    Hours: Not open 24 hours  E-Prescribing  E-Prescribing controlled substances  Mode: Mail Order  Type: External      Recent Labs    07/31/20 1125 02/14/21 1340 06/26/21 1457  HGBA1C 5.9* 5.5 5.3    Try to get in with Eye Doctor and send [place] a copy of the eye doctor report.  Keep up with [facility] as well.  -------------------  [email or web address] [address], [place], [place] [address]  ~14.3 mi [phone]   CHRONIC PAIN MANAGEMENT  [facility] Ph: [phone]  [facility] [email or web address] [address], [place], [place] [address]  ~5.5 mi [phone]  --------------  [facility] [address] [place], [place]  [address] Appointments: [phone]   Please schedule a Follow-up Appointment to: Return in about 6 months (around 12/24/2021) for 6 month fasting lab only then 1 week later Follow-up DM, neuropathy.  If you have any other questions or concerns, please feel free to call the office or send a message through [place]. You may also schedule an earlier appointment if necessary.  Additionally, you may be receiving a survey about your experience at our office within a few days to 1 week by e-mail or mail. We value your feedback.  Nobie Putnam, DO [place]

## 2021-06-27 ENCOUNTER — Other Ambulatory Visit: Payer: Self-pay | Admitting: Family Medicine

## 2021-06-27 DIAGNOSIS — F331 Major depressive disorder, recurrent, moderate: Secondary | ICD-10-CM

## 2021-06-27 DIAGNOSIS — E785 Hyperlipidemia, unspecified: Secondary | ICD-10-CM

## 2021-06-27 DIAGNOSIS — I10 Essential (primary) hypertension: Secondary | ICD-10-CM

## 2021-06-27 DIAGNOSIS — E1169 Type 2 diabetes mellitus with other specified complication: Secondary | ICD-10-CM

## 2021-06-27 DIAGNOSIS — R351 Nocturia: Secondary | ICD-10-CM

## 2021-06-27 DIAGNOSIS — E1140 Type 2 diabetes mellitus with diabetic neuropathy, unspecified: Secondary | ICD-10-CM

## 2021-08-02 ENCOUNTER — Encounter: Payer: Self-pay | Admitting: Podiatry

## 2021-08-02 ENCOUNTER — Other Ambulatory Visit: Payer: Self-pay

## 2021-08-02 ENCOUNTER — Ambulatory Visit: Payer: Medicare Other | Admitting: Podiatry

## 2021-08-02 DIAGNOSIS — B351 Tinea unguium: Secondary | ICD-10-CM | POA: Diagnosis not present

## 2021-08-02 DIAGNOSIS — E1142 Type 2 diabetes mellitus with diabetic polyneuropathy: Secondary | ICD-10-CM

## 2021-08-02 DIAGNOSIS — M79674 Pain in right toe(s): Secondary | ICD-10-CM | POA: Diagnosis not present

## 2021-08-02 DIAGNOSIS — M79675 Pain in left toe(s): Secondary | ICD-10-CM | POA: Diagnosis not present

## 2021-08-02 DIAGNOSIS — E1140 Type 2 diabetes mellitus with diabetic neuropathy, unspecified: Secondary | ICD-10-CM | POA: Diagnosis not present

## 2021-08-02 DIAGNOSIS — I872 Venous insufficiency (chronic) (peripheral): Secondary | ICD-10-CM

## 2021-08-02 NOTE — Progress Notes (Signed)
This patient returns to my office for at risk foot care.  This patient requires this care by a professional since this patient will be at risk due to having diabetic neuropathy.  This patient is unable to cut nails himself since the patient cannot reach his nails.These nails are painful walking and wearing shoes.  This patient presents for at risk foot care today.  General Appearance  Alert, conversant and in no acute stress.  Vascular  Dorsalis pedis and posterior tibial  pulses are palpable  bilaterally.  Capillary return is within normal limits  bilaterally. Temperature is within normal limits  bilaterally.  Neurologic  Senn-Weinstein monofilament wire test absent bilaterally. Muscle power within normal limits bilaterally.  Nails Thick disfigured discolored nails with subungual debris  from hallux to fifth toes bilaterally. No evidence of bacterial infection or drainage bilaterally.  Orthopedic  No limitations of motion  feet .  No crepitus or effusions noted.  No bony pathology or digital deformities noted.  HAV  B/L.  Skin  normotropic skin with no porokeratosis noted bilaterally.  No signs of infections or ulcers noted.     Onychomycosis  Pain in right toes  Pain in left toes  Diabetes neuropathy.  Consent was obtained for treatment procedures.   Mechanical debridement of nails 1-5  bilaterally performed with a nail nipper.  Filed with dremel without incident. Patient qualifies for diabetic shoes due to DPN and HAV  B/L.   Return office visit  3 months                    Told patient to return for periodic foot care and evaluation due to potential at risk complications.   Gardiner Barefoot DPM

## 2021-08-16 DIAGNOSIS — R2689 Other abnormalities of gait and mobility: Secondary | ICD-10-CM | POA: Insufficient documentation

## 2021-08-27 ENCOUNTER — Ambulatory Visit: Payer: Medicare Other | Admitting: Podiatry

## 2021-08-27 ENCOUNTER — Other Ambulatory Visit: Payer: Self-pay

## 2021-08-27 ENCOUNTER — Ambulatory Visit: Payer: Medicare Other

## 2021-08-27 DIAGNOSIS — I872 Venous insufficiency (chronic) (peripheral): Secondary | ICD-10-CM

## 2021-08-27 DIAGNOSIS — E1140 Type 2 diabetes mellitus with diabetic neuropathy, unspecified: Secondary | ICD-10-CM

## 2021-08-27 NOTE — Progress Notes (Signed)
SITUATION ?Reason for Consult: Evaluation for Prefabricated Diabetic Shoes and Custom Diabetic Inserts. ?Patient / Caregiver Report: Patient would like well fitting shoes ? ?OBJECTIVE DATA: ?Patient History / Diagnosis:  ?  ICD-10-CM   ?1. Controlled type 2 diabetes mellitus with neuropathy (HCC)  E11.40   ?  ?2. Venous insufficiency of both lower extremities  I87.2   ?  ? ? ?Current or Previous Devices:   None and no history ? ?In-Person Foot Examination: ?Ulcers & Callousing:   Dorsum ulcer ? ?Deformities:   ?- Hammertoes ?  ?Shoe Size: 9W ? ?ORTHOTIC RECOMMENDATION ?Recommended Devices: ?- 1x pair prefabricated PDAC approved diabetic shoes; Patient Selected - Apex [id number] Boot Size 9W ?- 3x pair custom-to-patient PDAC approved vacuum formed diabetic insoles. ? ?GOALS OF SHOES AND INSOLES ?- Reduce shear and pressure ?- Reduce / Prevent callus formation ?- Reduce / Prevent ulceration ?- Protect the fragile healing compromised diabetic foot. ? ?Patient would benefit from diabetic shoes and inserts as patient has diabetes mellitus and the patient has one or more of the following conditions: ?- History of previous foot ulceration. ?- History of pre-ulcerative callus ?- Peripheral neuropathy with evidence of callus formation ?- Foot deformity ?- Poor circulation ? ?ACTIONS PERFORMED ?Patient was casted for insoles via crush box and measured for shoes via brannock device. Procedure was explained and patient tolerated procedure well. All questions were answered and concerns addressed. ? ?PLAN ?Patient is to be contacted and scheduled for fitting once CMN is obtained from treaing physician and shoes and insoles have been fabricated and received. ? ?

## 2021-09-25 ENCOUNTER — Telehealth: Payer: Self-pay | Admitting: Family Medicine

## 2021-09-25 NOTE — Telephone Encounter (Signed)
Left message for patient to call back and schedule Medicare Annual Wellness Visit (AWV) to be done virtually or by telephone. ? ?No hx of AWV eligible as of 09/16/18 ? ?Please schedule at anytime with [facility].     ? ? ?Any questions, please call me at [phone]  ?

## 2021-09-28 NOTE — Telephone Encounter (Signed)
Pt called back regarding this appt, pt stated he would need to do it over the phone since he has transportation issues, please advise.  ?

## 2021-10-05 ENCOUNTER — Other Ambulatory Visit: Payer: Self-pay | Admitting: Family Medicine

## 2021-10-05 ENCOUNTER — Ambulatory Visit (INDEPENDENT_AMBULATORY_CARE_PROVIDER_SITE_OTHER): Payer: Medicare Other

## 2021-10-05 VITALS — Ht 68.0 in | Wt 170.0 lb

## 2021-10-05 DIAGNOSIS — F411 Generalized anxiety disorder: Secondary | ICD-10-CM

## 2021-10-05 DIAGNOSIS — Z0000 Encounter for general adult medical examination without abnormal findings: Secondary | ICD-10-CM | POA: Diagnosis not present

## 2021-10-05 DIAGNOSIS — I10 Essential (primary) hypertension: Secondary | ICD-10-CM

## 2021-10-05 DIAGNOSIS — Z1211 Encounter for screening for malignant neoplasm of colon: Secondary | ICD-10-CM | POA: Diagnosis not present

## 2021-10-05 NOTE — Patient Instructions (Signed)
Mr. Manigo , ?Thank you for taking time to come for your Medicare Wellness Visit. I appreciate your ongoing commitment to your health goals. Please review the following plan we discussed and let me know if I can assist you in the future.  ? ?Screening recommendations/referrals: ?Colonoscopy: sent referral for COLOGUARD ?Recommended yearly ophthalmology/optometry visit for glaucoma screening and checkup ?Recommended yearly dental visit for hygiene and checkup ? ?Vaccinations: ?Influenza vaccine: n/d ?Pneumococcal vaccine: n/d ?Tdap vaccine: n/d ?Shingles vaccine: n/d   ?Covid-19: 09/24/19 ? ?Advanced directives: no ? ?Conditions/risks identified: none ? ?Next appointment: Follow up in one year for your annual wellness visit - declined ? ?Preventive Care 40-64 Years, Male ?Preventive care refers to lifestyle choices and visits with your health care provider that can promote health and wellness. ?What does preventive care include? ?A yearly physical exam. This is also called an annual well check. ?Dental exams once or twice a year. ?Routine eye exams. Ask your health care provider how often you should have your eyes checked. ?Personal lifestyle choices, including: ?Daily care of your teeth and gums. ?Regular physical activity. ?Eating a healthy diet. ?Avoiding tobacco and drug use. ?Limiting alcohol use. ?Practicing safe sex. ?Taking low-dose aspirin every day starting at age 44. ?What happens during an annual well check? ?The services and screenings done by your health care provider during your annual well check will depend on your age, overall health, lifestyle risk factors, and family history of disease. ?Counseling  ?Your health care provider may ask you questions about your: ?Alcohol use. ?Tobacco use. ?Drug use. ?Emotional well-being. ?Home and relationship well-being. ?Sexual activity. ?Eating habits. ?Work and work Astronomer. ?Screening  ?You may have the following tests or measurements: ?Height, weight, and  BMI. ?Blood pressure. ?Lipid and cholesterol levels. These may be checked every 5 years, or more frequently if you are over 70 years old. ?Skin check. ?Lung cancer screening. You may have this screening every year starting at age 72 if you have a 30-pack-year history of smoking and currently smoke or have quit within the past 15 years. ?Fecal occult blood test (FOBT) of the stool. You may have this test every year starting at age 31. ?Flexible sigmoidoscopy or colonoscopy. You may have a sigmoidoscopy every 5 years or a colonoscopy every 10 years starting at age 20. ?Prostate cancer screening. Recommendations will vary depending on your family history and other risks. ?Hepatitis C blood test. ?Hepatitis B blood test. ?Sexually transmitted disease (STD) testing. ?Diabetes screening. This is done by checking your blood sugar (glucose) after you have not eaten for a while (fasting). You may have this done every 1-3 years. ?Discuss your test results, treatment options, and if necessary, the need for more tests with your health care provider. ?Vaccines  ?Your health care provider may recommend certain vaccines, such as: ?Influenza vaccine. This is recommended every year. ?Tetanus, diphtheria, and acellular pertussis (Tdap, Td) vaccine. You may need a Td booster every 10 years. ?Zoster vaccine. You may need this after age 27. ?Pneumococcal 13-valent conjugate (PCV13) vaccine. You may need this if you have certain conditions and have not been vaccinated. ?Pneumococcal polysaccharide (PPSV23) vaccine. You may need one or two doses if you smoke cigarettes or if you have certain conditions. ?Talk to your health care provider about which screenings and vaccines you need and how often you need them. ?This information is not intended to replace advice given to you by your health care provider. Make sure you discuss any questions you have with your  health care provider. ?Document Released: 06/30/2015 Document Revised: 02/21/2016  Document Reviewed: 04/04/2015 ?Elsevier Interactive Patient Education ? 2017 Elsevier Inc. ? ?Fall Prevention in the Home ?Falls can cause injuries. They can happen to people of all ages. There are many things you can do to make your home safe and to help prevent falls. ?What can I do on the outside of my home? ?Regularly fix the edges of walkways and driveways and fix any cracks. ?Remove anything that might make you trip as you walk through a door, such as a raised step or threshold. ?Trim any bushes or trees on the path to your home. ?Use bright outdoor lighting. ?Clear any walking paths of anything that might make someone trip, such as rocks or tools. ?Regularly check to see if handrails are loose or broken. Make sure that both sides of any steps have handrails. ?Any raised decks and porches should have guardrails on the edges. ?Have any leaves, snow, or ice cleared regularly. ?Use sand or salt on walking paths during winter. ?Clean up any spills in your garage right away. This includes oil or grease spills. ?What can I do in the bathroom? ?Use night lights. ?Install grab bars by the toilet and in the tub and shower. Do not use towel bars as grab bars. ?Use non-skid mats or decals in the tub or shower. ?If you need to sit down in the shower, use a plastic, non-slip stool. ?Keep the floor dry. Clean up any water that spills on the floor as soon as it happens. ?Remove soap buildup in the tub or shower regularly. ?Attach bath mats securely with double-sided non-slip rug tape. ?Do not have throw rugs and other things on the floor that can make you trip. ?What can I do in the bedroom? ?Use night lights. ?Make sure that you have a light by your bed that is easy to reach. ?Do not use any sheets or blankets that are too big for your bed. They should not hang down onto the floor. ?Have a firm chair that has side arms. You can use this for support while you get dressed. ?Do not have throw rugs and other things on the floor  that can make you trip. ?What can I do in the kitchen? ?Clean up any spills right away. ?Avoid walking on wet floors. ?Keep items that you use a lot in easy-to-reach places. ?If you need to reach something above you, use a strong step stool that has a grab bar. ?Keep electrical cords out of the way. ?Do not use floor polish or wax that makes floors slippery. If you must use wax, use non-skid floor wax. ?Do not have throw rugs and other things on the floor that can make you trip. ?What can I do with my stairs? ?Do not leave any items on the stairs. ?Make sure that there are handrails on both sides of the stairs and use them. Fix handrails that are broken or loose. Make sure that handrails are as long as the stairways. ?Check any carpeting to make sure that it is firmly attached to the stairs. Fix any carpet that is loose or worn. ?Avoid having throw rugs at the top or bottom of the stairs. If you do have throw rugs, attach them to the floor with carpet tape. ?Make sure that you have a light switch at the top of the stairs and the bottom of the stairs. If you do not have them, ask someone to add them for you. ?What else can  I do to help prevent falls? ?Wear shoes that: ?Do not have high heels. ?Have rubber bottoms. ?Are comfortable and fit you well. ?Are closed at the toe. Do not wear sandals. ?If you use a stepladder: ?Make sure that it is fully opened. Do not climb a closed stepladder. ?Make sure that both sides of the stepladder are locked into place. ?Ask someone to hold it for you, if possible. ?Clearly mark and make sure that you can see: ?Any grab bars or handrails. ?First and last steps. ?Where the edge of each step is. ?Use tools that help you move around (mobility aids) if they are needed. These include: ?Canes. ?Walkers. ?Scooters. ?Crutches. ?Turn on the lights when you go into a dark area. Replace any light bulbs as soon as they burn out. ?Set up your furniture so you have a clear path. Avoid moving your  furniture around. ?If any of your floors are uneven, fix them. ?If there are any pets around you, be aware of where they are. ?Review your medicines with your doctor. Some medicines can make you feel d

## 2021-10-05 NOTE — Progress Notes (Signed)
?Virtual Visit via Telephone Note ? ?I connected with  Alex Green on 10/05/21 at 10:15 AM EDT by telephone and verified that I am speaking with the correct person using two identifiers. ? ?Location: ?Patient: home ?Provider: [facility] ?Persons participating in the virtual visit: patient/Nurse Health Advisor ?  ?I discussed the limitations, risks, security and privacy concerns of performing an evaluation and management service by telephone and the availability of in person appointments. The patient expressed understanding and agreed to proceed. ? ?Interactive audio and video telecommunications were attempted between this nurse and patient, however failed, due to patient having technical difficulties OR patient did not have access to video capability.  We continued and completed visit with audio only. ? ?Some vital signs may be absent or patient reported.  ? ?Hal Hope, LPN ? ?Subjective:  ? Alex Green is a 56 y.o. male who presents for an Initial Medicare Annual Wellness Visit. ? ?Review of Systems    ? ?  ? ?   ?Objective:  ?  ?There were no vitals filed for this visit. ?There is no height or weight on file to calculate BMI. ? ? ?  09/06/2020  ? 10:28 AM 08/12/2018  ?  6:41 PM  ?Advanced Directives  ?Does Patient Have a Medical Advance Directive? No No  ? ? ?Current Medications (verified) ?Outpatient Encounter Medications as of 10/05/2021  ?Medication Sig  ? amoxicillin-clavulanate (AUGMENTIN) 875-125 MG tablet amoxicillin 875 mg-potassium clavulanate 125 mg tablet ? TAKE 1 TABLET BY MOUTH TWICE A DAY  ? DULoxetine (CYMBALTA) 60 MG capsule Take 1 capsule (60 mg total) by mouth daily.  ? fluticasone (FLONASE) 50 MCG/ACT nasal spray PLACE 2 SPRAYS INTO BOTH NOSTRILS DAILY FOR 4-6 WEEKS THEN STOP AND USE SEASONALLY OR AS NEEDED.  ? levETIRAcetam (KEPPRA) 500 MG tablet Take 1 tablet (500 mg total) by mouth 2 (two) times daily for 7 days.  ? levETIRAcetam (KEPPRA) 500 MG tablet levetiracetam 500 mg tablet ?  TAKE 1 TABLET BY MOUTH TWICE A DAY FOR 7 DAYS  ? pregabalin (LYRICA) 150 MG capsule Take 1 capsule (150 mg total) by mouth in the morning, at noon, in the evening, and at bedtime.  ? TRULICITY 1.5 MG/0.5ML SOPN Inject 1.5 mg into the skin once a week.  ? valsartan (DIOVAN) 80 MG tablet Take 1 tablet (80 mg total) by mouth daily.  ? zolpidem (AMBIEN CR) 12.5 MG CR tablet Take 1 tablet (12.5 mg total) by mouth at bedtime as needed for sleep.  ? ?No facility-administered encounter medications on file as of 10/05/2021.  ? ? ?Allergies (verified) ?Other  ? ?History: ?Past Medical History:  ?Diagnosis Date  ? Alcoholism (HCC)   ? Anemia   ? Anxiety   ? GERD (gastroesophageal reflux disease)   ? Hypertension   ? Neuropathy   ? ?Past Surgical History:  ?Procedure Laterality Date  ? ANKLE SURGERY    ? ?No family history on file. ?Social History  ? ?Socioeconomic History  ? Marital status: Divorced  ?  Spouse name: Not on file  ? Number of children: Not on file  ? Years of education: Not on file  ? Highest education level: Not on file  ?Occupational History  ? Not on file  ?Tobacco Use  ? Smoking status: Every Day  ?  Packs/day: 1.50  ?  Years: 10.00  ?  Pack years: 15.00  ?  Types: Cigarettes  ? Smokeless tobacco: Never  ?Substance and Sexual Activity  ?  Alcohol use: Yes  ?  Alcohol/week: 50.0 standard drinks  ?  Types: 50 Cans of beer per week  ? Drug use: Not Currently  ? Sexual activity: Not on file  ?Other Topics Concern  ? Not on file  ?Social History Narrative  ? ** Merged History Encounter **  ?    ? ?Social Determinants of Health  ? ?Financial Resource Strain: Not on file  ?Food Insecurity: Not on file  ?Transportation Needs: Not on file  ?Physical Activity: Not on file  ?Stress: Not on file  ?Social Connections: Not on file  ? ? ?Tobacco Counseling ?Ready to quit: Not Answered ?Counseling given: Not Answered ? ? ?Clinical Intake: ? ?Pre-visit preparation completed: Yes ? ?Pain : No/denies pain ? ?  ? ?Diabetes:  Yes ?CBG done?: No ?Did pt. bring in CBG monitor from home?: No ? ?How often do you need to have someone help you when you read instructions, pamphlets, or other written materials from your doctor or pharmacy?: 1 - Never ? ?Diabetic?yes ?Nutrition Risk Assessment: ? ?Has the patient had any N/V/D within the last 2 months?  No  ?Does the patient have any non-healing wounds?  No  ?Has the patient had any unintentional weight loss or weight gain?  No  ? ?Diabetes: ? ?Is the patient diabetic?  Yes  ?If diabetic, was a CBG obtained today?  No  ?Did the patient bring in their glucometer from home?  No  ?How often do you monitor your CBG's? Once every few days.  ? ?Financial Strains and Diabetes Management: ? ?Are you having any financial strains with the device, your supplies or your medication? No .  ?Does the patient want to be seen by Chronic Care Management for management of their diabetes?  No  ?Would the patient like to be referred to a Nutritionist or for Diabetic Management?  No  ? ?Diabetic Exams: ? ?Diabetic Eye Exam: Completed has MD out of town. Overdue for diabetic eye exam. Pt has been advised about the importance in completing this exam.  ? ?Diabetic Foot Exam: Completed 6-8 weeks ago. Pt has been advised about the importance in completing this exam.   ? ? ?Interpreter Needed?: No ? ?Information entered by :: Kennedy BuckerLorrie Shandell Jallow, LPN ? ? ?Activities of Daily Living ?   ? View : No data to display.  ?  ?  ?  ? ? ?Patient Care Team: ?Smitty CordsKaramalegos, Alexander J, DO as PCP - General (Family Medicine) ?Pcp, No ? ?Indicate any recent Medical Services you may have received from other than [facility] providers in the past year (date may be approximate). ? ?   ?Assessment:  ? This is a routine wellness examination for Alex Green. ? ?Hearing/Vision screen ?No results found. ? ?Dietary issues and exercise activities discussed: ?  ? ? Goals Addressed   ?None ?  ? ?Depression Screen ? ?  06/26/2021  ?  3:20 PM 02/14/2021  ?  1:54 PM  07/31/2020  ?  1:15 PM 04/21/2020  ?  3:27 PM  ?PHQ 2/9 Scores  ?PHQ - 2 Score 2 2 2 4   ?PHQ- 9 Score 8 9 9 14   ?  ?Fall Risk ? ?  07/31/2020  ? 10:42 AM  ?Fall Risk   ?Falls in the past year? 1  ?Number falls in past yr: 1  ?Injury with Fall? 0  ?Risk for fall due to : Impaired balance/gait;Impaired mobility;History of fall(s)  ? ? ?FALL RISK PREVENTION PERTAINING TO THE HOME: ? ?  Any stairs in or around the home? No  ?If so, are there any without handrails? No  ?Home free of loose throw rugs in walkways, pet beds, electrical cords, etc? Yes  ?Adequate lighting in your home to reduce risk of falls? Yes  ? ?ASSISTIVE DEVICES UTILIZED TO PREVENT FALLS: ? ?Life alert? No  ?Use of a cane, walker or w/c? Yes  ?Grab bars in the bathroom? No  ?Shower chair or bench in shower? No  ?Elevated toilet seat or a handicapped toilet? No  ? ?Cognitive Function:  ?  ? ?Immunizations ?Immunization History  ?Administered Date(s) Administered  ? Janssen (J&J) SARS-COV-2 Vaccination 09/24/2019  ? ? ?TDAP status: Due, Education has been provided regarding the importance of this vaccine. Advised may receive this vaccine at local pharmacy or Health Dept. Aware to provide a copy of the vaccination record if obtained from local pharmacy or Health Dept. Verbalized acceptance and understanding. ? ?Flu Vaccine status: Declined, Education has been provided regarding the importance of this vaccine but patient still declined. Advised may receive this vaccine at local pharmacy or Health Dept. Aware to provide a copy of the vaccination record if obtained from local pharmacy or Health Dept. Verbalized acceptance and understanding. ? ?Pneumococcal vaccine status: Declined,  Education has been provided regarding the importance of this vaccine but patient still declined. Advised may receive this vaccine at local pharmacy or Health Dept. Aware to provide a copy of the vaccination record if obtained from local pharmacy or Health Dept. Verbalized acceptance  and understanding.  ? ?Covid-19 vaccine status: Completed vaccines ? ?Qualifies for Shingles Vaccine? No   ?Zostavax completed No   ?Shingrix Completed?: No.    Education has been provided regarding the importance

## 2021-10-05 NOTE — Telephone Encounter (Signed)
Requested medications are due for refill today.  yes ? ?Requested medications are on the active medications list.  yes ? ?Last refill. 02/14/2021 #90 1 refill ? ?Future visit scheduled.   yes ? ?Notes to clinic.  Medication refill is not delegated. ? ? ? ?Requested Prescriptions  ?Pending Prescriptions Disp Refills  ? zolpidem (AMBIEN CR) 12.5 MG CR tablet 90 tablet 1  ?  Sig: Take 1 tablet (12.5 mg total) by mouth at bedtime as needed for sleep.  ?  ? Not Delegated - Psychiatry:  Anxiolytics/Hypnotics Failed - 10/05/2021  2:59 PM  ?  ?  Failed - This refill cannot be delegated  ?  ?  Failed - Urine Drug Screen completed in last 360 days  ?  ?  Passed - Valid encounter within last 6 months  ?  Recent Outpatient Visits   ? ?      ? 3 months ago Controlled type 2 diabetes mellitus with neuropathy (HCC)  ? [facility] [place], [name], DO  ? 7 months ago Controlled type 2 diabetes mellitus with neuropathy (HCC)  ? [facility] [place], [name], DO  ? 9 months ago Ankle ulcer, left, with fat layer exposed (HCC)  ? [facility] [place], [name], DO  ? 1 year ago Laceration of scalp, initial encounter  ? [facility] [place], [name], DO  ? 1 year ago Controlled type 2 diabetes mellitus with neuropathy (HCC)  ? [facility] [name], DO  ? ?  ?  ?Future Appointments   ? ?        ? In 2 months [name], [name], DO [facility]  ? ?  ? ? ?  ?  ?  ?  ?

## 2021-10-05 NOTE — Telephone Encounter (Signed)
Medication Refill - Medication: zolpidem (AMBIEN CR) 12.5 MG CR tablet ? ?Has the patient contacted their pharmacy?  ?yes ?Pt states [facility] told [facility] they could not request this med. ? ?Preferred Pharmacy (with phone number or street name): [facility], [place] ?Has the patient been seen for an appointment in the last year OR does the patient have an upcoming appointment? Yes.   ? ?Agent: Please be advised that RX refills may take up to 3 business days. We ask that you follow-up with your pharmacy. ? ?

## 2021-10-05 NOTE — Telephone Encounter (Signed)
Requested medication (s) are due for refill today: yes ? ?Requested medication (s) are on the active medication list: yes ? ?Last refill:  02/14/21 #90/1 ? ?Future visit scheduled: yes ? ?Notes to clinic:  Unable to refill per protocol due to failed labs, no updated results. ? ?  ?Requested Prescriptions  ?Pending Prescriptions Disp Refills  ? valsartan (DIOVAN) 80 MG tablet [Pharmacy Med Name: VALSARTAN TABS 80MG] 90 tablet 3  ?  Sig: TAKE 1 TABLET DAILY  ?  ? Cardiovascular:  Angiotensin Receptor Blockers Failed - 10/05/2021  2:38 PM  ?  ?  Failed - Cr in normal range and within 180 days  ?  Creat  ?Date Value Ref Range Status  ?07/31/2020 0.86 0.70 - 1.33 mg/dL Final  ?  Comment:  ?  For patients >14 years of age, the reference limit ?for Creatinine is approximately 13% higher for people ?identified as African-American. ?. ?  ?  ?  ?  ?  Failed - K in normal range and within 180 days  ?  Potassium  ?Date Value Ref Range Status  ?07/31/2020 4.6 3.5 - 5.3 mmol/L Final  ?  ?  ?  ?  Passed - Patient is not pregnant  ?  ?  Passed - Last BP in normal range  ?  BP Readings from Last 1 Encounters:  ?06/26/21 137/65  ?  ?  ?  ?  Passed - Valid encounter within last 6 months  ?  Recent Outpatient Visits   ? ?      ? 3 months ago Controlled type 2 diabetes mellitus with neuropathy ([place])  ? [place], DO  ? 7 months ago Controlled type 2 diabetes mellitus with neuropathy ([place])  ? [place], DO  ? 9 months ago Ankle ulcer, left, with fat layer exposed ([place])  ? [place], DO  ? 1 year ago Laceration of scalp, initial encounter  ? [place], DO  ? 1 year ago Controlled type 2 diabetes mellitus with neuropathy ([place])  ? [place], DO  ? ?  ?  ?Future Appointments   ? ?        ? In 2 months Parks Ranger, [facility], [facility]  ? ?  ? ? ?  ?  ?  ? DULoxetine (CYMBALTA) 60 MG capsule [Pharmacy Med Name: DULOXETINE HCL DR CAPS 60MG] 90 capsule 3  ?  Sig: TAKE 1 CAPSULE DAILY  ?  ? Psychiatry: Antidepressants - SNRI - duloxetine Failed - 10/05/2021  2:38 PM  ?  ?  Failed - Cr in normal range and within 360 days  ?  Creat  ?Date Value Ref Range Status  ?07/31/2020 0.86 0.70 - 1.33 mg/dL Final  ?  Comment:  ?  For patients >34 years of age, the reference limit ?for Creatinine is approximately 13% higher for people ?identified as African-American. ?. ?  ?  ?  ?  ?  Failed - eGFR is 30 or above and within 360 days  ?  GFR, Est African American  ?Date Value Ref Range Status  ?07/31/2020 112 > OR = 60 mL/min/1.7m Final  ? ?GFR, Est Non African American  ?Date Value Ref Range Status  ?07/31/2020 97 > OR = 60 mL/min/1.779mFinal  ?  ?  ?  ?  Passed -  Completed PHQ-2 or PHQ-9 in the last 360 days  ?  ?  Passed - Last BP in normal range  ?  BP Readings from Last 1 Encounters:  ?06/26/21 137/65  ?  ?  ?  ?  Passed - Valid encounter within last 6 months  ?  Recent Outpatient Visits   ? ?      ? 3 months ago Controlled type 2 diabetes mellitus with neuropathy ([place])  ? [place], DO  ? 7 months ago Controlled type 2 diabetes mellitus with neuropathy ([place])  ? [place], DO  ? 9 months ago Ankle ulcer, left, with fat layer exposed ([place])  ? [place], DO  ? 1 year ago Laceration of scalp, initial encounter  ? [place], DO  ? 1 year ago Controlled type 2 diabetes mellitus with neuropathy ([place])  ? [place], DO  ? ?  ?  ?Future Appointments   ? ?        ? In 2 months Parks Ranger, [name], DO [facility] [place], [place]  ? ?  ? ? ?  ?  ?  ? ?

## 2021-10-08 MED ORDER — ZOLPIDEM TARTRATE ER 12.5 MG PO TBCR: 12.5000 mg | EXTENDED_RELEASE_TABLET | Freq: Every evening | ORAL | 1 refills | Status: DC | PRN

## 2021-10-09 ENCOUNTER — Other Ambulatory Visit: Payer: Self-pay

## 2021-10-09 DIAGNOSIS — E1140 Type 2 diabetes mellitus with diabetic neuropathy, unspecified: Secondary | ICD-10-CM

## 2021-10-09 MED ORDER — TRULICITY 1.5 MG/0.5ML ~~LOC~~ SOAJ: 1.5000 mg | SUBCUTANEOUS | 1 refills | Status: DC

## 2021-10-16 ENCOUNTER — Telehealth: Payer: Self-pay | Admitting: Family Medicine

## 2021-10-16 DIAGNOSIS — E1140 Type 2 diabetes mellitus with diabetic neuropathy, unspecified: Secondary | ICD-10-CM

## 2021-10-16 MED ORDER — PREGABALIN 150 MG PO CAPS: 150.0000 mg | ORAL_CAPSULE | Freq: Four times a day (QID) | ORAL | 1 refills | Status: DC

## 2021-10-16 NOTE — Telephone Encounter (Signed)
Medication Refill - Medication: pregabalin (LYRICA) 150 MG capsule ? ?Has the patient contacted their pharmacy? Yes.   ?Pt states he is out of medication and need a 10 day supply  ?(40 tabs) to [facility] and the 90 [facility] to [facility] ? ?Preferred Pharmacy (with phone number or street name): [facility] - [place], [place] - [address] AND  ?[id number] [place], [place] - [address] ?Has the patient been seen for an appointment in the last year OR does the patient have an upcoming appointment? Yes.   ? ?Agent: Please be advised that RX refills may take up to 3 business days. We ask that you follow-up with your pharmacy. ? ?

## 2021-11-08 ENCOUNTER — Telehealth: Payer: Self-pay

## 2021-11-08 NOTE — Telephone Encounter (Signed)
CMN resubmitted 

## 2021-11-15 ENCOUNTER — Encounter: Payer: Medicare Other | Attending: Physician Assistant | Admitting: Physician Assistant

## 2021-11-15 DIAGNOSIS — B354 Tinea corporis: Secondary | ICD-10-CM | POA: Insufficient documentation

## 2021-11-15 DIAGNOSIS — I872 Venous insufficiency (chronic) (peripheral): Secondary | ICD-10-CM | POA: Diagnosis not present

## 2021-11-15 DIAGNOSIS — E119 Type 2 diabetes mellitus without complications: Secondary | ICD-10-CM | POA: Insufficient documentation

## 2021-11-15 DIAGNOSIS — B353 Tinea pedis: Secondary | ICD-10-CM | POA: Insufficient documentation

## 2021-11-15 NOTE — Progress Notes (Signed)
TREON, KEHL ([id number]) Visit Report for 11/15/2021 Allergy List Details Patient Name: Alex Green, Alex Green. Date of Service: 11/15/2021 10:00 AM Medical Record Number: [id number] Patient Account Number: [id number] Date of Birth/Sex: March 27, 1966 (56 y.o. M) Treating RN: Angelina Pih Primary Care Eowyn Tabone: Saralyn Pilar Other Clinician: Referring Ashyah Quizon: Saralyn Pilar Treating Mayari Matus/Extender: Allen Derry Weeks in Treatment: 0 Allergies Active Allergies No Known Drug Allergies Allergy Notes Electronic Signature(s) Signed: 11/15/2021 3:50:43 PM By: Angelina Pih Entered By: Angelina Pih on 11/15/2021 10:40:50 Alex Green ([id number]) -------------------------------------------------------------------------------- Arrival Information Details Patient Name: Alex Green Date of Service: 11/15/2021 10:00 AM Medical Record Number: [id number] Patient Account Number: [id number] Date of Birth/Sex: 1966-01-05 (56 y.o. M) Treating RN: Angelina Pih Primary Care Nelsie Domino: Saralyn Pilar Other Clinician: Referring Kamran Coker: Saralyn Pilar Treating Sehaj Mcenroe/Extender: Rowan Blase in Treatment: 0 Visit Information Patient Arrived: Ambulatory Arrival Time: 10:37 Accompanied By: self Transfer Assistance: None Patient Identification Verified: Yes Secondary Verification Process Completed: Yes History Since Last Visit Added or deleted any medications: No Any new allergies or adverse reactions: No Had a fall or experienced change in activities of daily living that may affect risk of falls: Yes Hospitalized since last visit: No Has Dressing in Place as Prescribed: Yes Electronic Signature(s) Signed: 11/15/2021 3:50:43 PM By: Angelina Pih Entered By: Angelina Pih on 11/15/2021 10:38:19 Alex Green ([id number]) -------------------------------------------------------------------------------- Clinic Level of Care Assessment  Details Patient Name: Alex Green. Date of Service: 11/15/2021 10:00 AM Medical Record Number: [id number] Patient Account Number: [id number] Date of Birth/Sex: 1965-07-26 (56 y.o. M) Treating RN: Angelina Pih Primary Care Mishel Sans: Saralyn Pilar Other Clinician: Referring Kristyna Bradstreet: Saralyn Pilar Treating Ellean Firman/Extender: Rowan Blase in Treatment: 0 Clinic Level of Care Assessment Items TOOL 2 Quantity Score []  - Use when only an EandM is performed on the INITIAL visit 0 ASSESSMENTS - Nursing Assessment / Reassessment X - General Physical Exam (combine w/ comprehensive assessment (listed just below) when performed on new 1 20 pt. evals) []  - 0 Comprehensive Assessment (HX, ROS, Risk Assessments, Wounds Hx, etc.) ASSESSMENTS - Wound and Skin Assessment / Reassessment []  - Simple Wound Assessment / Reassessment - one wound 0 []  - 0 Complex Wound Assessment / Reassessment - multiple wounds X- 1 10 Dermatologic / Skin Assessment (not related to wound area) ASSESSMENTS - Ostomy and/or Continence Assessment and Care []  - Incontinence Assessment and Management 0 []  - 0 Ostomy Care Assessment and Management (repouching, etc.) PROCESS - Coordination of Care X - Simple Patient / Family Education for ongoing care 1 15 []  - 0 Complex (extensive) Patient / Family Education for ongoing care X- 1 10 Staff obtains , Records, Test Results / Process Orders []  - 0 Staff telephones HHA, Nursing Homes / Clarify orders / etc []  - 0 Routine Transfer to another Facility (non-emergent condition) []  - 0 Routine Hospital Admission (non-emergent condition) X- 1 15 New Admissions / / Ordering NPWT, Apligraf, etc. []  - 0 Emergency Hospital Admission (emergent condition) X- 1 10 Simple Discharge Coordination []  - 0 Complex (extensive) Discharge Coordination PROCESS - Special Needs []  - Pediatric / Minor Patient Management 0 []  -  0 Isolation Patient Management []  - 0 Hearing / Language / Visual special needs []  - 0 Assessment of Community assistance (transportation, D/C planning, etc.) []  - 0 Additional assistance / Altered mentation []  - 0 Support Surface(s) Assessment (bed, cushion, seat, etc.) INTERVENTIONS - Wound Cleansing / Measurement []  - Wound Imaging (photographs - any number of wounds) 0 []  -  0 Wound Tracing (instead of photographs) []  - 0 Simple Wound Measurement - one wound []  - 0 Complex Wound Measurement - multiple wounds Alex Green, Alex C. ( ) []  - 0 Simple Wound Cleansing - one wound []  - 0 Complex Wound Cleansing - multiple wounds INTERVENTIONS - Wound Dressings []  - Small Wound Dressing one or multiple wounds 0 []  - 0 Medium Wound Dressing one or multiple wounds []  - 0 Large Wound Dressing one or multiple wounds []  - 0 Application of Medications - injection INTERVENTIONS - Miscellaneous []  - External ear exam 0 []  - 0 Specimen Collection (cultures, biopsies, blood, body fluids, etc.) []  - 0 Specimen(s) / Culture(s) sent or taken to Lab for analysis []  - 0 Patient Transfer (multiple staff / / Similar devices) []  - 0 Simple Staple / Suture removal (25 or less) []  - 0 Complex Staple / Suture removal (26 or more) []  - 0 Hypo / Hyperglycemic Management (close monitor of Blood Glucose) []  - 0 Ankle / Brachial Index (ABI) - do not check if billed separately Has the patient been seen at the hospital within the last three years: Yes Total Score: 80 Level Of Care: New/Established - Level 3 Electronic Signature(s) Signed: 11/15/2021 3:50:43 PM By: Entered By: on 11/15/2021 11:11:22 ( ) -------------------------------------------------------------------------------- Encounter Discharge Information Details Patient Name: . Date of Service: 11/15/2021 10:00 AM Medical Record Number:  Patient Account Number: Date of Birth/Sex: 18-Dec-1965 (56 y.o. M) Treating RN: Primary Care Rawleigh Rode: Other Clinician: Referring Aleecia Tapia: Treating Merilyn Pagan/Extender: in Treatment: 0 Encounter Discharge Information Items Discharge Condition: Stable Ambulatory Status: Ambulatory Discharge Destination: Home Transportation: Private Auto Accompanied By: self Schedule Follow-up Appointment: No Clinical Summary of Care: Electronic Signature(s) Signed: 11/15/2021 11:12:34 AM By: Angelina Pih Entered By: Angelina Pih on 11/15/2021 11:12:34 Alex Green ([id number]) -------------------------------------------------------------------------------- Pain Assessment Details Patient Name: Alex Green. Date of Service: 11/15/2021 10:00 AM Medical Record Number: [id number] Patient Account Number: [id number] Date of Birth/Sex: 01-16-1966 (56 y.o. M) Treating RN: Angelina Pih Primary Care Virgil Lightner: Saralyn Pilar Other Clinician: Referring Raynaldo Falco: Saralyn Pilar Treating Marie Borowski/Extender: Rowan Blase in Treatment: 0 Active Problems Location of Pain Severity and Description of Pain Patient Has Paino Yes Site Locations Rate the pain. Current Pain Level: 6 Pain Management and Medication Current Pain Management: Notes pt states neuropathy pain Electronic Signature(s) Signed: 11/15/2021 3:50:43 PM By: Angelina Pih Entered By: Angelina Pih on 11/15/2021 10:38:36 Alex Green ([id number]) -------------------------------------------------------------------------------- Patient/Caregiver Education Details Patient Name: Alex Green. Date of Service: 11/15/2021 10:00 AM Medical Record Number: [id number] Patient Account Number: [id number] Date of Birth/Gender: [birth year] (56 y.o. M) Treating RN: Angelina Pih Primary Care Physician: Saralyn Pilar Other Clinician: Referring Physician: Saralyn Pilar Treating Physician/Extender: Rowan Blase in Treatment: 0 Education Assessment Education Provided To: Patient Education Topics Provided Wound/Skin Impairment: Handouts: Other: cream recommended Methods: Explain/Verbal Responses: State content correctly Electronic Signature(s) Signed: 11/15/2021 3:50:43 PM By: Angelina Pih Entered By: Angelina Pih on 11/15/2021 11:11:53 Alex Green ([id number]) -------------------------------------------------------------------------------- Vitals Details Patient Name: Alex Green. Date of Service: 11/15/2021 10:00 AM Medical Record Number: [id number] Patient Account Number: [id number] Date of Birth/Sex: Mar 21, 1966 (56 y.o. M) Treating RN: Angelina Pih Primary Care Jaiven Graveline: Saralyn Pilar Other Clinician: Referring Gaynelle Pastrana: Saralyn Pilar Treating Rolen Conger/Extender: Rowan Blase in Treatment: 0 Vital Signs Time Taken: 10:38 Temperature (F): 99.7 Height (in): 68 Pulse (bpm): 94 Source: Stated  Respiratory Rate (breaths/min): 18 Weight (lbs): 170 Blood Pressure (mmHg): 149/88 Source: Stated Reference Range: 80 - 120 mg / dl Body Mass Index (BMI): 25.8 Electronic Signature(s) Signed: 11/15/2021 3:50:43 PM By: Angelina PihGordon, Caitlin Entered By: Angelina PihGordon, Caitlin on 11/15/2021 10:39:32

## 2021-11-15 NOTE — Progress Notes (Signed)
ISIAHA, GREENUP ([id number]) Visit Report for 11/15/2021 Chief Complaint Document Details Patient Name: Alex Green, Alex Green. Date of Service: 11/15/2021 10:00 AM Medical Record Number: [id number] Patient Account Number: [id number] Date of Birth/Sex: 1966-04-29 (56 y.o. M) Treating RN: Angelina Pih Primary Care Provider: Saralyn Pilar Other Clinician: Referring Provider: Saralyn Pilar Treating Provider/Extender: Rowan Blase in Treatment: 0 Information Obtained from: Patient Chief Complaint Bilateral Foot and Ankle Tinea Infections Electronic Signature(s) Signed: 11/15/2021 11:22:00 AM By: Lenda Kelp PA-C Entered By: Lenda Kelp on 11/15/2021 11:22:00 Alex Green ([id number]) -------------------------------------------------------------------------------- HPI Details Patient Name: Alex Green Date of Service: 11/15/2021 10:00 AM Medical Record Number: [id number] Patient Account Number: [id number] Date of Birth/Sex: 12/15/1965 (56 y.o. M) Treating RN: Angelina Pih Primary Care Provider: Saralyn Pilar Other Clinician: Referring Provider: Saralyn Pilar Treating Provider/Extender: Rowan Blase in Treatment: 0 History of Present Illness HPI Description: 01/18/21 upon evaluation today patient presents for initial inspection here in the clinic concerning issues that he's been having with wounds which are venous in nature of the left lateral malleolus and left medial malleolus. Medial is actually not doing too poorly overall which is great news. In fact this is an area that he just scratched which calls the wound. Collateral malleolus actually appears to show some signs of hyper granulation and is not doing quite as well. Overall I think that the patient would benefit from [facility] Dressing's a dressing of choice. I think this would help with both sides but especially the lateral portion. He does have a history of diabetes  mellitus type II in his most recent hemoglobin A1c was 5.6. Otherwise he does have a history of chronic venous insufficiency although he doesn't appear to be too swollen right now which is great news. 02/15/2021 upon evaluation today patient appears to be doing well currently in regard to his wounds. He is actually been tolerating the dressing changes without complication. Fortunately there does not appear to be any evidence of infection which is great and overall other than the fact we have seen him for about a month he has Green all some when the wound is healed the other is much smaller. Readmission: Patient presented today for what appears to be a tinea rash on the right ankle and foot area greater than the left ankle and foot area. Nonetheless there is nothing open or draining he has no wounds currently. He has been using moisturizer lotion which helps to some degree. With that being said I do believe he would probably benefit mostly at this point from an antifungal cream. That was discussed with him today. Electronic Signature(s) Signed: 11/15/2021 11:22:57 AM By: Lenda Kelp PA-C Entered By: Lenda Kelp on 11/15/2021 11:22:57 Alex Green ([id number]) -------------------------------------------------------------------------------- Physical Exam Details Patient Name: Alex Green. Date of Service: 11/15/2021 10:00 AM Medical Record Number: [id number] Patient Account Number: [id number] Date of Birth/Sex: 1965/09/24 (56 y.o. M) Treating RN: Angelina Pih Primary Care Provider: Saralyn Pilar Other Clinician: Referring Provider: Saralyn Pilar Treating Provider/Extender: Rowan Blase in Treatment: 0 Constitutional patient is hypertensive.. pulse regular and within target range for patient.Marland Kitchen respirations regular, non-labored and within target range for patient.Marland Kitchen temperature within target range for patient.. Well-nourished and well-hydrated in no acute  distress. Eyes conjunctiva clear no eyelid edema noted. pupils equal round and reactive to light and accommodation. Ears, Nose, Mouth, and Throat no gross abnormality of ear auricles or external auditory canals. normal hearing noted during conversation. mucus membranes moist. Respiratory  normal breathing without difficulty. Cardiovascular 1+ dorsalis pedis/posterior tibialis pulses. no clubbing, cyanosis, significant edema, <3 sec cap refill. Psychiatric this patient is able to make decisions and demonstrates good insight into disease process. Alert and Oriented x 3. pleasant and cooperative. Notes Patient again had no open wounds noted at this point which is great news. With that being said I do believe that he would benefit from again a topical athlete's foot cream to be used on the leg he should do this 1-2 times a day depending on the directions on the packaging but otherwise there does not appear to be any evidence of ongoing issue at this point from the standpoint of there being an open wound. Electronic Signature(s) Signed: 11/15/2021 11:23:28 AM By: Lenda KelpStone III, Olayinka Gathers PA-C Entered By: Lenda KelpStone III, Quinesha Selinger on 11/15/2021 11:23:28 Alex Green, Alex C. ([id number]) -------------------------------------------------------------------------------- Physician Orders Details Patient Name: Alex Green, Alex C. Date of Service: 11/15/2021 10:00 AM Medical Record Number: [id number] Patient Account Number: [id number] Date of Birth/Sex: 11/21/1965 (56 y.o. M) Treating RN: Angelina PihGordon, Caitlin Primary Care Provider: Saralyn PilarKaramalegos, Alexander Other Clinician: Referring Provider: Saralyn PilarKaramalegos, Alexander Treating Provider/Extender: Rowan BlaseStone, Kylin Dubs Weeks in Treatment: 0 Verbal / Phone Orders: No Diagnosis [facility] Consult Only - athletes foot cream to area. Call us at wound clinic if anything opens up into a actual wound Electronic Signature(s) Signed: 11/15/2021 3:50:43 PM By: Angelina PihGordon,  Caitlin Signed: 11/15/2021 4:22:20 PM By: Lenda KelpStone III, Yashas Camilli PA-C Entered By: Angelina PihGordon, Caitlin on 11/15/2021 10:45:12 Alex Green, Alex C. ([id number]) -------------------------------------------------------------------------------- Problem List Details Patient Name: Alex Green, Alex C. Date of Service: 11/15/2021 10:00 AM Medical Record Number: [id number] Patient Account Number: [id number] Date of Birth/Sex: 05/19/1966 (56 y.o. M) Treating RN: Huel CoventryWoody, Kim Primary Care Provider: Saralyn PilarKaramalegos, Alexander Other Clinician: Betha LoaVenable, Angie Referring Provider: Saralyn PilarKaramalegos, Alexander Treating Provider/Extender: Rowan BlaseStone, Klaire Court Weeks in Treatment: 0 Active Problems ICD-10 Encounter Code Description Active Date MDM Diagnosis B35.4 Tinea corporis 11/15/2021 No Yes B35.3 Tinea pedis 11/15/2021 No Yes Inactive Problems Resolved Problems Electronic Signature(s) Signed: 11/15/2021 11:21:39 AM By: Lenda KelpStone III, Quinetta Shilling PA-C Entered By: Lenda KelpStone III, Elby Blackwelder on 11/15/2021 11:21:38 Alex Green, Alex C. ([id number]) -------------------------------------------------------------------------------- Progress Note Details Patient Name: Alex Green, Alex C. Date of Service: 11/15/2021 10:00 AM Medical Record Number: [id number] Patient Account Number: [id number] Date of Birth/Sex: 03/26/1966 ([age] y.o. M) Treating RN: Angelina PihGordon, Caitlin Primary Care Provider: Saralyn PilarKaramalegos, Alexander Other Clinician: Referring Provider: Saralyn PilarKaramalegos, Alexander Treating Provider/Extender: Rowan BlaseStone, Briahna Pescador Weeks in Treatment: 0 Subjective Chief Complaint Information obtained from Patient Bilateral Foot and Ankle Tinea Infections History of Present Illness (HPI) 01/18/21 upon evaluation today patient presents for initial inspection here in the clinic concerning issues that he's been having with wounds which are venous in nature of the left lateral malleolus and left medial malleolus. Medial is actually not doing too poorly overall which is great news. In fact this is an area  that he just scratched which calls the wound. Collateral malleolus actually appears to show some signs of hyper granulation and is not doing quite as well. Overall I think that the patient would benefit from [facility] a dressing of choice. I think this would help with both sides but especially the lateral portion. He does have a history of diabetes mellitus type II in his most recent hemoglobin A1c was 5.6. Otherwise he does have a history of chronic venous insufficiency although he doesn't appear to be too swollen right now which is great news. 02/15/2021 upon evaluation today patient appears to be doing well currently in regard to  his wounds. He is actually been tolerating the dressing changes without complication. Fortunately there does not appear to be any evidence of infection which is great and overall other than the fact we have seen him for about a month he has Green all some when the wound is healed the other is much smaller. Readmission: Patient presented today for what appears to be a tinea rash on the right ankle and foot area greater than the left ankle and foot area. Nonetheless there is nothing open or draining he has no wounds currently. He has been using moisturizer lotion which helps to some degree. With that being said I do believe he would probably benefit mostly at this point from an antifungal cream. That was discussed with him today. Allergies No Known Drug Allergies Objective Constitutional patient is hypertensive.. pulse regular and within target range for patient.Marland Kitchen respirations regular, non-labored and within target range for patient.Marland Kitchen temperature within target range for patient.. Well-nourished and well-hydrated in no acute distress. Vitals Time Taken: 10:38 AM, Height: 68 in, Source: Stated, Weight: 170 lbs, Source: Stated, BMI: 25.8, Temperature: 99.7 F, Pulse: 94 bpm, Respiratory Rate: 18 breaths/min, Blood Pressure: 149/88 mmHg. Eyes conjunctiva clear  no eyelid edema noted. pupils equal round and reactive to light and accommodation. Ears, Nose, Mouth, and Throat no gross abnormality of ear auricles or external auditory canals. normal hearing noted during conversation. mucus membranes moist. Respiratory normal breathing without difficulty. Cardiovascular 1+ dorsalis pedis/posterior tibialis pulses. no clubbing, cyanosis, significant edema, Psychiatric this patient is able to make decisions and demonstrates good insight into disease process. Alert and Oriented x 3. pleasant and cooperative. General Notes: Patient again had no open wounds noted at this point which is great news. With that being said I do believe that he would benefit from again a topical athlete's foot cream to be used on the leg he should do this 1-2 times a day depending on the directions on the packaging but otherwise there does not appear to be any evidence of ongoing issue at this point from the standpoint of there being an open wound. BURNS, TIMSON ([id number]) Assessment Active Problems ICD-10 Tinea corporis Tinea pedis Plan Discharge From [facility] Services: Consult Only - athletes foot cream to area. Call us at wound clinic if anything opens up into a actual wound 1. I am just seeing this patient for consult today I did recommend over-the-counter topical antifungal cream which I think is can be the best way to go. 2. Also can recommend that we have the patient continue to monitor for any signs of infection. Obviously if anything changes or he develops any open wounds he should let me know soon as possible. We will see him back for a follow-up visit as needed. Electronic Signature(s) Signed: 11/15/2021 11:24:00 AM By: Lenda Kelp PA-C Entered By: Lenda Kelp on 11/15/2021 11:24:00 Alex Green ([id number]) -------------------------------------------------------------------------------- SuperBill Details Patient Name: Alex Green Date of  Service: 11/15/2021 Medical Record Number: [id number] Patient Account Number: [id number] Date of Birth/Sex: [birth year] (56 y.o. M) Treating RN: Angelina Pih Primary Care Provider: Saralyn Pilar Other Clinician: Referring Provider: Saralyn Pilar Treating Provider/Extender: Rowan Blase in Treatment: 0 Diagnosis Coding ICD-10 Codes Code Description B35.4 Tinea corporis B35.3 Tinea pedis Facility Procedures CPT4 Code: 94801655 Description: 99213 - WOUND CARE VISIT-LEV 3 EST PT Modifier: Quantity: 1 Physician Procedures CPT4 Code: 3748270 Description: 99213 - WC PHYS LEVEL 3 - EST PT Modifier: Quantity: 1 CPT4 Code: Description: ICD-10 Diagnosis Description B35.4  Tinea corporis B35.3 Tinea pedis Modifier: Quantity: Electronic Signature(s) Signed: 11/15/2021 11:24:43 AM By: Lenda Kelp PA-C Previous Signature: 11/15/2021 11:11:26 AM Version By: Angelina Pih Entered By: Lenda Kelp on 11/15/2021 11:24:42

## 2021-11-16 ENCOUNTER — Telehealth: Payer: Self-pay | Admitting: Family Medicine

## 2021-11-16 NOTE — Telephone Encounter (Signed)
Pt was fitted for diabetic shoes w/ [facility] on 08/27/2021. Pt has not received his shoes yet. [facility] advised pt they are waiting for something from Dr [name]. (CMN) Pt doesn't understand what the issue is.  Can you help?

## 2021-11-16 NOTE — Telephone Encounter (Signed)
Returned pts call regarding CMN. Advised him to contact PCP office to check status (I manually sent it 05/30 and it was sent electronically 06/01).

## 2021-11-21 ENCOUNTER — Telehealth: Payer: Self-pay

## 2021-11-21 NOTE — Telephone Encounter (Signed)
Shoes ordered  [id number]

## 2021-12-06 NOTE — Telephone Encounter (Signed)
Pt is calling back to check on the status of the CMN for  Diabetic Shoes CB- [phone]

## 2021-12-24 ENCOUNTER — Other Ambulatory Visit: Payer: Medicare Other

## 2021-12-24 ENCOUNTER — Ambulatory Visit: Payer: Medicare Other | Admitting: Family Medicine

## 2021-12-24 DIAGNOSIS — E1169 Type 2 diabetes mellitus with other specified complication: Secondary | ICD-10-CM

## 2021-12-24 DIAGNOSIS — F331 Major depressive disorder, recurrent, moderate: Secondary | ICD-10-CM

## 2021-12-24 DIAGNOSIS — I10 Essential (primary) hypertension: Secondary | ICD-10-CM

## 2021-12-24 DIAGNOSIS — E1140 Type 2 diabetes mellitus with diabetic neuropathy, unspecified: Secondary | ICD-10-CM

## 2021-12-24 DIAGNOSIS — R351 Nocturia: Secondary | ICD-10-CM

## 2021-12-31 ENCOUNTER — Ambulatory Visit: Payer: Medicare Other | Admitting: Family Medicine

## 2021-12-31 ENCOUNTER — Ambulatory Visit: Payer: Self-pay

## 2021-12-31 ENCOUNTER — Other Ambulatory Visit: Payer: Medicare Other

## 2021-12-31 NOTE — Telephone Encounter (Signed)
  Chief Complaint: Fall Symptoms: fall - Frequency: 20 minutes ago Pertinent Negatives: Patient denies damage from fall Disposition: [] ED /[] Urgent Care (no appt availability in office) / [] Appointment(In office/virtual)/ []  [place] [facility] [facility]/ [] Home Care/ [x] Refused Recommended Disposition /[] [facility]/ []  Follow-up with PCP Additional Notes: PT called to cancel OV appointment today. He mentioned to the agent that he had fallen a few minutes earlier and was "banged up". Per pt he tripped over his crocs, when one came off due to worn velcro. Pt states he has not eaten anything today. Pt tested blood sugar while on the phone reading was 185. Pt refuses to come in today. He states he just needs to eat something. Pt states that his girlfriend is there. Pt sounds a little groggy. Pt requested a new appt for labs and PCP. Pt is also needs an update on his diabetic shoes.  Reason for Disposition  [1] MODERATE weakness (i.e., interferes with work, school, normal activities) AND [2] new-onset or worsening  Answer Assessment - Initial Assessment Questions 1. MECHANISM: "How did the fall happen?"     Tripped on crocs  2. DOMESTIC VIOLENCE AND ELDER ABUSE SCREENING: "Did you fall because someone pushed you or tried to hurt you?" If Yes, ask: "Are you safe now?"     no 3. ONSET: "When did the fall happen?" (e.g., minutes, hours, or days ago)     25  4. LOCATION: "What part of the body hit the ground?" (e.g., back, buttocks, head, hips, knees, hands, head, stomach)     Right side of body, lower, knee 5. INJURY: "Did you hurt (injure) yourself when you fell?" If Yes, ask: "What did you injure? Tell me more about this?" (e.g., body area; type of injury; pain severity)"      6. PAIN: "Is there any pain?" If Yes, ask: "How bad is the pain?" (e.g., Scale 1-10; or mild,  moderate, severe)   - NONE (0): No pain   - MILD (1-3): Doesn't interfere with normal activities    - MODERATE (4-7):  Interferes with normal activities or awakens from sleep    - SEVERE (8-10): Excruciating pain, unable to do any normal activities      6-7/10 7. SIZE: For cuts, bruises, or swelling, ask: "How large is it?" (e.g., inches or centimeters)      no 8. PREGNANCY: "Is there any chance you are pregnant?" "When was your last menstrual period?"     na 9. OTHER SYMPTOMS: "Do you have any other symptoms?" (e.g., dizziness, fever, weakness; new onset or worsening).      nauseous 10. CAUSE: "What do you think caused the fall (or falling)?" (e.g., tripped, dizzy spell)       Blood sugar - hasn't eaten today  Protocols used: Falls and [facility]

## 2022-01-02 ENCOUNTER — Encounter: Payer: Self-pay | Admitting: Family Medicine

## 2022-01-02 ENCOUNTER — Ambulatory Visit: Payer: Medicare Other | Admitting: Family Medicine

## 2022-01-02 ENCOUNTER — Other Ambulatory Visit: Payer: Self-pay | Admitting: Family Medicine

## 2022-01-02 VITALS — BP 132/80 | HR 98 | Ht 68.0 in | Wt 159.0 lb

## 2022-01-02 DIAGNOSIS — L853 Xerosis cutis: Secondary | ICD-10-CM | POA: Diagnosis not present

## 2022-01-02 DIAGNOSIS — I10 Essential (primary) hypertension: Secondary | ICD-10-CM | POA: Diagnosis not present

## 2022-01-02 DIAGNOSIS — E1140 Type 2 diabetes mellitus with diabetic neuropathy, unspecified: Secondary | ICD-10-CM

## 2022-01-02 DIAGNOSIS — F411 Generalized anxiety disorder: Secondary | ICD-10-CM

## 2022-01-02 DIAGNOSIS — Z0000 Encounter for general adult medical examination without abnormal findings: Secondary | ICD-10-CM

## 2022-01-02 DIAGNOSIS — E1169 Type 2 diabetes mellitus with other specified complication: Secondary | ICD-10-CM

## 2022-01-02 DIAGNOSIS — Z91018 Allergy to other foods: Secondary | ICD-10-CM

## 2022-01-02 DIAGNOSIS — R351 Nocturia: Secondary | ICD-10-CM

## 2022-01-02 MED ORDER — CLOTRIMAZOLE-BETAMETHASONE 1-0.05 % EX CREA: TOPICAL_CREAM | CUTANEOUS | 2 refills | Status: DC

## 2022-01-02 MED ORDER — VALSARTAN 80 MG PO TABS: 80.0000 mg | ORAL_TABLET | Freq: Every day | ORAL | 3 refills | Status: DC

## 2022-01-02 MED ORDER — DULOXETINE HCL 60 MG PO CPEP: 60.0000 mg | ORAL_CAPSULE | Freq: Every day | ORAL | 3 refills | Status: DC

## 2022-01-02 NOTE — Assessment & Plan Note (Signed)
Well-controlled HTN - Home BP readings      Plan:  1. Continue current BP regimen Valsartan 80mg  daily 2. Encourage improved lifestyle - low sodium diet, regular exercise 3. advised to monitor BP outside office, bring readings to next visit, if persistently >140/90 or new symptoms notify office sooner

## 2022-01-02 NOTE — Assessment & Plan Note (Signed)
Well-controlled DM previous Complications - peripheral neuropathy, hyperlipidemia, GERD, depression, obesity-  increases risk of future cardiovascular complications   Plan:  1. Trulicity 1.5mg  weekly 2. Encourage improved lifestyle - low carb, low sugar diet, reduce portion size, continue improving regular exercise 3. Check CBG , bring log to next visit for review 4. Continue ARB, future consider statin  For neuropathy PT, Neurology, he is on max dose Lyrica, Duloxetine

## 2022-01-02 NOTE — Progress Notes (Signed)
Subjective:    Patient ID: Alex Green, male    DOB: 1965-10-15, 56 y.o.   MRN: [id number]  Alex Green is a 56 y.o. male presenting on 01/02/2022 for Diabetes   HPI  CHRONIC DM, Type 2, with neuropathy HTN Reports history of initial dx DM >6 years ago, with A1c 12.1 in past Improved A1c control prior range 5.5 to 5.9% CBGs: Has glucometer again, no detailed report today. Meds: Trulicity 1.5mg  weekly - Off Metformin and Amaryl Reports good compliance. Tolerating well w/o side-effects Currently on ARB  Lifestyle: - Diet (improving) Exercise (limited due to neuropathy) - Admits chronic neuropathy - he takes Lyrica 150mg  4 times daily. He is unable to maintain balance. Has seen [facility] Dr Manuella Ghazi for Neurology Eye doctor is in [place], and establishing with local Eye Doctor has not scheduled due to transportation Denies hypoglycemia, polyuria, visual changes, numbness or tingling.   Chronic problem with Balance / Neuropathy He had complicated history with fall head injury, has seen Dr Manuella Ghazi Neuro Also issues with balance, complication as well with neuropathy On Lyrica, Duloxetine. Concerns with chronic pain management, interested in future referral   Major Depression, recurrent Moderate partial remission On Medication Duloxetine, doing well.   Left Ankle Skin Ulceration =- resolved  Fall recently Labs ordered missed last time     10/05/2021   10:32 AM 06/26/2021    3:20 PM 02/14/2021    1:54 PM  Depression screen PHQ 2/9  Decreased Interest 1 1 1   Down, Depressed, Hopeless 1 1 1   PHQ - 2 Score 2 2 2   Altered sleeping 1 1 1   Tired, decreased energy 1 3 3   Change in appetite 1 1 1   Feeling bad or failure about yourself  1 1 1   Trouble concentrating 0 0 1  Moving slowly or fidgety/restless 0 0 0  Suicidal thoughts 0 0 0  PHQ-9 Score 6 8 9   Difficult doing work/chores Somewhat difficult Not difficult at all Somewhat difficult    Social History   Tobacco Use    Smoking status: Every Day    Packs/day: 1.50    Years: 10.00    Total pack years: 15.00    Types: Cigarettes   Smokeless tobacco: Never  Substance Use Topics   Alcohol use: Yes    Alcohol/week: 50.0 standard drinks of alcohol    Types: 50 Cans of beer per week   Drug use: Not Currently    Review of Systems  Constitutional:  Negative for activity change, appetite change, chills, diaphoresis, fatigue and fever.  HENT:  Negative for congestion and hearing loss.   Eyes:  Negative for visual disturbance.  Respiratory:  Negative for cough, chest tightness, shortness of breath and wheezing.   Cardiovascular:  Negative for chest pain, palpitations and leg swelling.  Gastrointestinal:  Negative for abdominal pain, constipation, diarrhea, nausea and vomiting.  Genitourinary:  Negative for dysuria, frequency and hematuria.  Musculoskeletal:  Negative for arthralgias and neck pain.  Skin:  Negative for rash.  Neurological:  Negative for dizziness, weakness, light-headedness, numbness and headaches.  Hematological:  Negative for adenopathy.  Psychiatric/Behavioral:  Negative for behavioral problems, dysphoric mood and sleep disturbance.    Per HPI unless specifically indicated above     Objective:    BP 132/80 (BP Location: Left Arm, Cuff Size: Normal)   Pulse 98   Ht 5\' 8"  (1.727 m)   Wt 159 lb (72.1 kg)   SpO2 95%   BMI 24.18  kg/m   Wt Readings from Last 3 Encounters:  01/02/22 159 lb (72.1 kg)  10/05/21 170 lb (77.1 kg)  06/26/21 170 lb (77.1 kg)    Physical Exam Vitals and nursing note reviewed.  Constitutional:      General: He is not in acute distress.    Appearance: He is well-developed. He is not diaphoretic.     Comments: Well-appearing, comfortable, cooperative  HENT:     Head: Normocephalic and atraumatic.  Eyes:     General:        Right eye: No discharge.        Left eye: No discharge.     Conjunctiva/sclera: Conjunctivae normal.     Pupils: Pupils are  equal, round, and reactive to light.  Neck:     Thyroid: No thyromegaly.  Cardiovascular:     Rate and Rhythm: Normal rate and regular rhythm.     Pulses: Normal pulses.     Heart sounds: Normal heart sounds. No murmur heard. Pulmonary:     Effort: Pulmonary effort is normal. No respiratory distress.     Breath sounds: Normal breath sounds. No wheezing or rales.  Abdominal:     General: Bowel sounds are normal. There is no distension.     Palpations: Abdomen is soft. There is no mass.     Tenderness: There is no abdominal tenderness.  Musculoskeletal:        General: No tenderness. Normal range of motion.     Cervical back: Normal range of motion and neck supple.     Right lower leg: No edema.     Left lower leg: No edema.     Comments: Upper / Lower Extremities: - Normal muscle tone, strength bilateral upper extremities 5/5, lower extremities 5/5  Lymphadenopathy:     Cervical: No cervical adenopathy.  Skin:    General: Skin is warm and dry.     Findings: Rash (Dry skin dermatitis R lower ext) present. No erythema.  Neurological:     Mental Status: He is alert and oriented to person, place, and time.     Comments: Distal sensation intact to light touch all extremities  Psychiatric:        Mood and Affect: Mood normal.        Behavior: Behavior normal.        Thought Content: Thought content normal.     Comments: Well groomed, good eye contact, normal speech and thoughts     Diabetic Foot Exam - Simple   Simple Foot Form Diabetic Foot exam was performed with the following findings: Yes 01/02/2022  1:58 PM  Visual Inspection See comments: Yes Sensation Testing Intact to touch and monofilament testing bilaterally: Yes Pulse Check Posterior Tibialis and Dorsalis pulse intact bilaterally: Yes Comments Extensive dry skin dermatitis flaking bilateral feet R worse than Left, higher up on ankle and leg and dorsal foot, has thickened toenails bilateral. Some callus formation. No  reduced monofilament.     Results for orders placed or performed in visit on 06/26/21  POCT glycosylated hemoglobin (Hb A1C)  Result Value Ref Range   Hemoglobin A1C 5.3 4.0 - 5.6 %      Assessment & Plan:   Problem List Items Addressed This Visit     Controlled type 2 diabetes mellitus with neuropathy (HCC) - Primary    Well-controlled DM previous Complications - peripheral neuropathy, hyperlipidemia, GERD, depression, obesity-  increases risk of future cardiovascular complications   Plan:  1. Trulicity 1.5mg  weekly 2.  Encourage improved lifestyle - low carb, low sugar diet, reduce portion size, continue improving regular exercise 3. Check CBG , bring log to next visit for review 4. Continue ARB, future consider statin  For neuropathy PT, Neurology, he is on max dose Lyrica, Duloxetine      Relevant Medications   valsartan (DIOVAN) 80 MG tablet   Essential hypertension    Well-controlled HTN - Home BP readings      Plan:  1. Continue current BP regimen Valsartan 80mg  daily 2. Encourage improved lifestyle - low sodium diet, regular exercise 3. 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 See exam note.  Plan - Will agree to sign off on orders for Diabetic Shoes. Will need the order form from patient / podiatry, we  will call them today to check if they were able to receive the last order or if they can send 01/04/22 a new document to sign. - Patient would benefit from Diabetic Shoes due to neuropathy with callus formation and great toe deformity causing pain, diabetes control is improving on current regimen, and I am continuing to monitor and manage diabetes.    Meds ordered this encounter  Medications   clotrimazole-betamethasone (LOTRISONE) cream    Sig: Apply 2 times a day for worsening flare dry skin dermatitis of toes/feet, may re-use daily up to 1 week as needed.    Dispense:  30 g    Refill:  2   DULoxetine (CYMBALTA) 60 MG capsule    Sig: Take 1 capsule (60 mg total) by mouth daily.    Dispense:  90 capsule    Refill:  3   valsartan (DIOVAN) 80 MG tablet    Sig: Take 1 tablet (80 mg total) by mouth daily.    Dispense:  90 tablet    Refill:  3      Follow up plan: Return in about 6 months (around 07/05/2022) for 6 month DM A1c.   07/07/2022, DO [facility] 01/02/2022, 1:43 PM

## 2022-01-02 NOTE — Patient Instructions (Addendum)
Thank you for coming to the office today.  Labs ordered including alpha gal  Use topical steroid / anti fungal cream twice a day for about 1-2 weeks in one area then can shift areas, on the leg.  If need we can refer to Dermatology  We will contact the [facility], and try to get them to send [place] the new order form and we can complete it again.  Please schedule a Follow-up Appointment to: Return in about 6 months (around 07/05/2022) for 6 month DM A1c.  If you have any other questions or concerns, please feel free to call the office or send a message through MyChart. You may also schedule an earlier appointment if necessary.  Additionally, you may be receiving a survey about your experience at our office within a few days to 1 week by e-mail or mail. We value your feedback.  Saralyn Pilar, DO [facility], [place]

## 2022-01-04 ENCOUNTER — Ambulatory Visit: Payer: Self-pay | Admitting: *Deleted

## 2022-01-04 NOTE — Telephone Encounter (Signed)
Second attempt to return call.   Left voicemail to return the call.

## 2022-01-04 NOTE — Telephone Encounter (Signed)
Summary: back/ leg pain   Patient's partner states patient is depressed and having back/ leg pain   Partner states patient forgot to mention it to provider at last ov   Please assist further      Pt states he meant to ask you about a change in depression medication. He is very depressed. Wants to change to a different med or increase the Cymbalta. Also states he has lost 10-15 lbs since on Trulicity. He is 5'9" and weighs 159 lbs. States FBS is 110-120. States he is drinking less.

## 2022-01-04 NOTE — Telephone Encounter (Signed)
Message from [place] sent at 01/04/2022  1:33 PM EDT  Summary: back/ leg pain   Patient's partner states patient is depressed and having back/ leg pain   Partner states patient forgot to mention it to provider at last ov   Please assist further           Call History   Type Contact Phone/Fax User  01/04/2022 01:30 PM EDT Phone (Incoming) Church,Hope (Emergency Contact) [phone] Carleene Overlie

## 2022-01-04 NOTE — Telephone Encounter (Signed)
Summary: back/ leg pain   Patient's partner states patient is depressed and having back/ leg pain   Partner states patient forgot to mention it to provider at last ov        Chief Complaint: forgot to ask for depression med change Symptoms: depressed, no energy Frequency: always Pertinent Negatives: Patient denies  Disposition: [] ED /[] Urgent Care (no appt availability in office) / [] Appointment(In office/virtual)/ []  [facility]/ [] Home Care/ [] Refused Recommended Disposition /[] [place] [facility]/ [x]  Follow-up with PCP Additional Notes: Pt seen this week and forgot to ask about changing or increasing depression med. He also would like a prescription med for his back pain.  Reason for Disposition  Depression medicines, questions about  Answer Assessment - Initial Assessment Questions 1. CONCERN: "What happened that made you call today?"     Forgot to mention change in depression med at recent visit 2. DEPRESSION SYMPTOM SCREENING: "How are you feeling overall?" (e.g., decreased energy, increased sleeping or difficulty sleeping, difficulty concentrating, feelings of sadness, guilt, hopelessness, or worthlessness)     Decreased energy, feels bad 3. RISK OF HARM - SUICIDAL IDEATION:  "Do you ever have thoughts of hurting or killing yourself?"  (e.g., yes, no, no but preoccupation with thoughts about death)   - INTENT:  "Do you have thoughts of hurting or killing yourself right NOW?" (e.g., yes, no, N/A)   - PLAN: "Do you have a specific plan for how you would do this?" (e.g., gun, knife, overdose, no plan, N/A)     no 4. RISK OF HARM - HOMICIDAL IDEATION:  "Do you ever have thoughts of hurting or killing someone else?"  (e.g., yes, no, no but preoccupation with thoughts about death)   - INTENT:  "Do you have thoughts of hurting or killing someone right NOW?" (e.g., yes, no, N/A)   - PLAN: "Do you have a specific plan for how you would do this?" (e.g., gun, knife, no plan,  N/A)       5. FUNCTIONAL IMPAIRMENT: "How have things been going for you overall? Have you had more difficulty than usual doing your normal daily activities?"  (e.g., better, same, worse; self-care, school, work, interactions)     States lost 10-15 lbs since and now he has less muscle mass 6. SUPPORT: "Who is with you now?" "Who do you live with?" "Do you have family or friends who you can talk to?"      partner 7. THERAPIST: "Do you have a counselor or therapist? Name?"     no 8. STRESSORS: "Has there been any new stress or recent changes in your life?"     He is trying to derease drinking. 9. ALCOHOL USE OR SUBSTANCE USE (DRUG USE): "Do you drink alcohol or use any illegal drugs?"     beer 10. OTHER: "Do you have any other physical symptoms right now?" (e.g., fever)       Back hurts, coughs from smoking 11. PREGNANCY: "Is there any chance you are pregnant?" "When was your last menstrual period?"       na  Protocols used: Depression-A-AH

## 2022-01-04 NOTE — Telephone Encounter (Signed)
Attempted to return call.   Left voicemail to call back to discuss issues with nurse regarding leg/back pain and depression.

## 2022-01-04 NOTE — Telephone Encounter (Signed)
  Chief Complaint: abd pain Symptoms: no bm in two weeks Frequency: abd pain comes and goes but has a steady lower level Pertinent Negatives: Patient denies fever Disposition: [x] ED /[] Urgent Care (no appt availability in office) / [] Appointment(In office/virtual)/ []  [place] [facility] [facility]/ [] Home Care/ [] Refused Recommended Disposition /[] [facility] [facility]/ []  Follow-up with PCP Additional Notes: Pt  was seen this week. Has appt Monday but he sounds sick and I have advised him to go to the ED. Hope, his significant other, states she will take him if he gets worse.   Reason for Disposition  Patient sounds very sick or weak to the triager  Answer Assessment - Initial Assessment Questions 1. LOCATION: "Where does it hurt?"      Abd pain no bm in two weeks 2. RADIATION: "Does the pain shoot anywhere else?" (e.g., chest, back)     His back hurts  3. ONSET: "When did the pain begin?" (Minutes, hours or days ago)      Two weeks 4. SUDDEN: "Gradual or sudden onset?"     unsure 5. PATTERN "Does the pain come and go, or is it constant?"    - If it comes and goes: "How long does it last?" "Do you have pain now?"     (Note: Comes and goes means the pain is intermittent. It goes away completely between bouts.)    - If constant: "Is it getting better, staying the same, or getting worse?"      (Note: Constant means the pain never goes away completely; most serious pain is constant and gets worse.)      Takes nauzene (OTC) for it 6. SEVERITY: "How bad is the pain?"  (e.g., Scale 1-10; mild, moderate, or severe)    - MILD (1-3): Doesn't interfere with normal activities, abdomen soft and not tender to touch.     - MODERATE (4-7): Interferes with normal activities or awakens from sleep, abdomen tender to touch.     - SEVERE (8-10): Excruciating pain, doubled over, unable to do any normal activities.       Passing gas but no bm in 2 weeks, pain is 7 7. RECURRENT SYMPTOM: "Have you ever had this  type of stomach pain before?" If Yes, ask: "When was the last time?" and "What happened that time?"      no 8. CAUSE: "What do you think is causing the stomach pain?"     Hope, girlfriend, is worried it is Colon Cancer like his brother died of. 74. RELIEVING/AGGRAVATING FACTORS: "What makes it better or worse?" (e.g., antacids, bending or twisting motion, bowel movement)     nauzeen 10. OTHER SYMPTOMS: "Do you have any other symptoms?" (e.g., back pain, diarrhea, fever, urination pain, vomiting)       Paiin back, urine is dark  Protocols used: Abdominal Pain - Male-A-AH

## 2022-01-07 LAB — CBC WITH DIFFERENTIAL/PLATELET
Absolute Monocytes: 1243 cells/uL — ABNORMAL HIGH (ref 200–950)
Basophils Absolute: 33 cells/uL (ref 0–200)
Basophils Relative: 0.3 %
Eosinophils Absolute: 56 cells/uL (ref 15–500)
Eosinophils Relative: 0.5 %
HCT: 42.6 % (ref 38.5–50.0)
Hemoglobin: 14.8 g/dL (ref 13.2–17.1)
Lymphs Abs: 699 cells/uL — ABNORMAL LOW (ref 850–3900)
MCH: 32.1 pg (ref 27.0–33.0)
MCHC: 34.7 g/dL (ref 32.0–36.0)
MCV: 92.4 fL (ref 80.0–100.0)
MPV: 9.9 fL (ref 7.5–12.5)
Monocytes Relative: 11.2 %
Neutro Abs: 9069 cells/uL — ABNORMAL HIGH (ref 1500–7800)
Neutrophils Relative %: 81.7 %
Platelets: 332 10*3/uL (ref 140–400)
RBC: 4.61 10*6/uL (ref 4.20–5.80)
RDW: 12.1 % (ref 11.0–15.0)
Total Lymphocyte: 6.3 %
WBC: 11.1 10*3/uL — ABNORMAL HIGH (ref 3.8–10.8)

## 2022-01-07 LAB — LIPID PANEL
Cholesterol: 150 mg/dL (ref <200)
HDL: 35 mg/dL — ABNORMAL LOW (ref >=40)
LDL Cholesterol (Calc): 94 mg/dL (calc)
Non-HDL Cholesterol (Calc): 115 mg/dL (calc) (ref <130)
Total CHOL/HDL Ratio: 4.3 (calc) (ref <5.0)
Triglycerides: 115 mg/dL (ref <150)

## 2022-01-07 LAB — ALPHA-GAL PANEL
Allergen, Mutton, f88: >100 kU/L — ABNORMAL HIGH
Allergen, Pork, f26: 54.2 kU/L — ABNORMAL HIGH
Beef: >100 kU/L — ABNORMAL HIGH
CLASS: 5
CLASS: 6
Class: 6
GALACTOSE-ALPHA-1,3-GALACTOSE IGE*: >100 kU/L — ABNORMAL HIGH (ref <0.10)

## 2022-01-07 LAB — COMPLETE METABOLIC PANEL WITH GFR
AG Ratio: 1.1 (calc) (ref 1.0–2.5)
ALT: 15 U/L (ref 9–46)
AST: 17 U/L (ref 10–35)
Albumin: 3.9 g/dL (ref 3.6–5.1)
Alkaline phosphatase (APISO): 137 U/L (ref 35–144)
BUN: 10 mg/dL (ref 7–25)
CO2: 23 mmol/L (ref 20–32)
Calcium: 8.8 mg/dL (ref 8.6–10.3)
Chloride: 92 mmol/L — ABNORMAL LOW (ref 98–110)
Creat: 1.15 mg/dL (ref 0.70–1.30)
Globulin: 3.5 g/dL (calc) (ref 1.9–3.7)
Glucose, Bld: 129 mg/dL — ABNORMAL HIGH (ref 65–99)
Potassium: 3.8 mmol/L (ref 3.5–5.3)
Sodium: 127 mmol/L — ABNORMAL LOW (ref 135–146)
Total Bilirubin: 0.9 mg/dL (ref 0.2–1.2)
Total Protein: 7.4 g/dL (ref 6.1–8.1)
eGFR: 74 mL/min/{1.73_m2} (ref >=60)

## 2022-01-07 LAB — HEMOGLOBIN A1C
Hgb A1c MFr Bld: 5.5 % of total Hgb (ref <5.7)
Mean Plasma Glucose: 111 mg/dL
eAG (mmol/L): 6.2 mmol/L

## 2022-01-07 LAB — PSA: PSA: 0.21 ng/mL (ref <=4.00)

## 2022-01-07 LAB — TSH: TSH: 3.02 mIU/L (ref 0.40–4.50)

## 2022-01-07 LAB — INTERPRETATION:

## 2022-01-08 ENCOUNTER — Ambulatory Visit (INDEPENDENT_AMBULATORY_CARE_PROVIDER_SITE_OTHER): Payer: Medicare Other | Admitting: Family Medicine

## 2022-01-08 ENCOUNTER — Encounter: Payer: Self-pay | Admitting: Family Medicine

## 2022-01-08 VITALS — Ht 68.0 in | Wt 159.0 lb

## 2022-01-08 DIAGNOSIS — E639 Nutritional deficiency, unspecified: Secondary | ICD-10-CM

## 2022-01-08 DIAGNOSIS — E1140 Type 2 diabetes mellitus with diabetic neuropathy, unspecified: Secondary | ICD-10-CM | POA: Diagnosis not present

## 2022-01-08 DIAGNOSIS — R110 Nausea: Secondary | ICD-10-CM | POA: Diagnosis not present

## 2022-01-08 DIAGNOSIS — F1020 Alcohol dependence, uncomplicated: Secondary | ICD-10-CM

## 2022-01-08 MED ORDER — TRULICITY 0.75 MG/0.5ML ~~LOC~~ SOAJ: 0.7500 mg | SUBCUTANEOUS | 1 refills | Status: DC

## 2022-01-08 MED ORDER — ONDANSETRON 4 MG PO TBDP: 4.0000 mg | ORAL_TABLET | Freq: Three times a day (TID) | ORAL | 0 refills | Status: DC | PRN

## 2022-01-08 NOTE — Progress Notes (Signed)
Virtual Visit via Telephone The purpose of this virtual visit is to provide medical care while limiting exposure to the novel coronavirus (COVID19) for both patient and office staff.  Consent was obtained for phone visit:  Yes.   Answered questions that patient had about telehealth interaction:  Yes.   I discussed the limitations, risks, security and privacy concerns of performing an evaluation and management service by telephone. I also discussed with the patient that there may be a patient responsible charge related to this service. The patient expressed understanding and agreed to proceed.  Patient Location: Home Provider Location: [facility] (Office)  Participants in virtual visit: - Patient: Alex Green - CMA: Orinda Kenner, [place] - Provider: Dr Parks Ranger  ---------------------------------------------------------------------- Chief Complaint  Patient presents with   Abdominal Pain   Back Pain    S: Reviewed CMA documentation. I have called patient and gathered additional HPI as follows:  Nausea w/o vomiting Poor PO intake Alcohol Dependence Tobacco Abuse Poor weight gain  Hyponatermia  Alpha Gal Allergy  Recent visit and labs reviewed. He has several complaints today was not feeling well enough to come into office and follow-up. They attempted to get EMS the other day but did not follow through.   Alcohol consumption has reduced also smoking.  Tried OTC [facility] nausea med  Poor PO intake admitted reduced meals intake Urine dark concern for dehydration  Admits poor energy fatigue He believes Duloxetine no longer working  Recent lab showed Alpha gal panel positive  Denies any fevers, chills, sweats, body ache, cough, shortness of breath, sinus pain or pressure, headache, diarrhea  Past Medical History:  Diagnosis Date   Alcoholism ([place])    Anemia    Anxiety    GERD (gastroesophageal reflux disease)    Hypertension    Neuropathy     Social History   Tobacco Use   Smoking status: Every Day    Packs/day: 1.50    Years: 10.00    Total pack years: 15.00    Types: Cigarettes   Smokeless tobacco: Never  Substance Use Topics   Alcohol use: Yes    Alcohol/week: 50.0 standard drinks of alcohol    Types: 50 Cans of beer per week   Drug use: Not Currently    Current Outpatient Medications:    clotrimazole-betamethasone (LOTRISONE) cream, Apply 2 times a day for worsening flare dry skin dermatitis of toes/feet, may re-use daily up to 1 week as needed., Disp: 30 g, Rfl: 2   DULoxetine (CYMBALTA) 60 MG capsule, Take 1 capsule (60 mg total) by mouth daily., Disp: 90 capsule, Rfl: 3   fluticasone (FLONASE) 50 MCG/ACT nasal spray, PLACE 2 SPRAYS INTO BOTH NOSTRILS DAILY FOR 4-6 WEEKS THEN STOP AND USE SEASONALLY OR AS NEEDED., Disp: 48 mL, Rfl: 1   ondansetron (ZOFRAN-ODT) 4 MG disintegrating tablet, Take 1 tablet (4 mg total) by mouth every 8 (eight) hours as needed for nausea or vomiting., Disp: 30 tablet, Rfl: 0   pregabalin (LYRICA) 150 MG capsule, Take 1 capsule (150 mg total) by mouth in the morning, at noon, in the evening, and at bedtime., Disp: 360 capsule, Rfl: 1   TRULICITY 6.71 IW/5.8KD SOPN, Inject 0.75 mg into the skin once a week., Disp: 6 mL, Rfl: 1   valsartan (DIOVAN) 80 MG tablet, Take 1 tablet (80 mg total) by mouth daily., Disp: 90 tablet, Rfl: 3   zolpidem (AMBIEN CR) 12.5 MG CR tablet, Take 1 tablet (12.5 mg total) by  mouth at bedtime as needed for sleep., Disp: 90 tablet, Rfl: 1   levETIRAcetam (KEPPRA) 500 MG tablet, Take 1 tablet (500 mg total) by mouth 2 (two) times daily for 7 days., Disp: 14 tablet, Rfl: 0   levETIRAcetam (KEPPRA) 500 MG tablet, levetiracetam 500 mg tablet  TAKE 1 TABLET BY MOUTH TWICE A DAY FOR 7 DAYS (Patient not taking: Reported on 10/05/2021), Disp: , Rfl:      10/05/2021   10:32 AM 06/26/2021    3:20 PM 02/14/2021    1:54 PM  Depression screen PHQ 2/9  Decreased Interest '1 1 1   ' Down, Depressed, Hopeless '1 1 1  ' PHQ - 2 Score '2 2 2  ' Altered sleeping '1 1 1  ' Tired, decreased energy '1 3 3  ' Change in appetite '1 1 1  ' Feeling bad or failure about yourself  '1 1 1  ' Trouble concentrating 0 0 1  Moving slowly or fidgety/restless 0 0 0  Suicidal thoughts 0 0 0  PHQ-9 Score '6 8 9  ' Difficult doing work/chores Somewhat difficult Not difficult at all Somewhat difficult       04/21/2020    3:28 PM  GAD 7 : Generalized Anxiety Score  Nervous, Anxious, on Edge 2  Control/stop worrying 0  Worry too much - different things 0  Trouble relaxing 0  Restless 0  Easily annoyed or irritable 1  Afraid - awful might happen 1  Total GAD 7 Score 4  Anxiety Difficulty Somewhat difficult    -------------------------------------------------------------------------- O: No physical exam performed due to remote telephone encounter.  Lab results reviewed.  Recent Results (from the past 2160 hour(s))  COMPLETE METABOLIC PANEL WITH GFR     Status: Abnormal   Collection Time: 01/02/22  2:09 PM  Result Value Ref Range   Glucose, Bld 129 (H) 65 - 99 mg/dL    Comment: .            Fasting reference interval . For someone without known diabetes, a glucose value >125 mg/dL indicates that they may have diabetes and this should be confirmed with a follow-up test. .    BUN 10 7 - 25 mg/dL   Creat 1.15 0.70 - 1.30 mg/dL   eGFR 74 > OR = 60 mL/min/1.85m    Comment: The eGFR is based on the CKD-EPI 2021 equation. To calculate  the new eGFR from a previous Creatinine or Cystatin C result, go to https://www.kidney.org/professionals/ kdoqi/gfr%5Fcalculator    BUN/Creatinine Ratio NOT APPLICABLE 6 - 22 (calc)   Sodium 127 (L) 135 - 146 mmol/L   Potassium 3.8 3.5 - 5.3 mmol/L   Chloride 92 (L) 98 - 110 mmol/L   CO2 23 20 - 32 mmol/L   Calcium 8.8 8.6 - 10.3 mg/dL   Total Protein 7.4 6.1 - 8.1 g/dL   Albumin 3.9 3.6 - 5.1 g/dL   Globulin 3.5 1.9 - 3.7 g/dL (calc)   AG Ratio 1.1 1.0  - 2.5 (calc)   Total Bilirubin 0.9 0.2 - 1.2 mg/dL   Alkaline phosphatase (APISO) 137 35 - 144 U/L   AST 17 10 - 35 U/L   ALT 15 9 - 46 U/L  CBC with Differential/Platelet     Status: Abnormal   Collection Time: 01/02/22  2:09 PM  Result Value Ref Range   WBC 11.1 (H) 3.8 - 10.8 Thousand/uL   RBC 4.61 4.20 - 5.80 Million/uL   Hemoglobin 14.8 13.2 - 17.1 g/dL   HCT 42.6 38.5 - 50.0 %  MCV 92.4 80.0 - 100.0 fL   MCH 32.1 27.0 - 33.0 pg   MCHC 34.7 32.0 - 36.0 g/dL   RDW 12.1 11.0 - 15.0 %   Platelets 332 140 - 400 Thousand/uL   MPV 9.9 7.5 - 12.5 fL   Neutro Abs 9,069 (H) 1,500 - 7,800 cells/uL   Lymphs Abs 699 (L) 850 - 3,900 cells/uL   Absolute Monocytes 1,243 (H) 200 - 950 cells/uL   Eosinophils Absolute 56 15 - 500 cells/uL   Basophils Absolute 33 0 - 200 cells/uL   Neutrophils Relative % 81.7 %   Total Lymphocyte 6.3 %   Monocytes Relative 11.2 %   Eosinophils Relative 0.5 %   Basophils Relative 0.3 %  Lipid panel     Status: Abnormal   Collection Time: 01/02/22  2:09 PM  Result Value Ref Range   Cholesterol 150 <200 mg/dL   HDL 35 (L) > OR = 40 mg/dL   Triglycerides 115 <150 mg/dL   LDL Cholesterol (Calc) 94 mg/dL (calc)    Comment: Reference range: <100 . Desirable range <100 mg/dL for primary prevention;   <70 mg/dL for patients with CHD or diabetic patients  with > or = 2 CHD risk factors. Marland Kitchen LDL-C is now calculated using the Martin-Hopkins  calculation, which is a validated novel method providing  better accuracy than the Friedewald equation in the  estimation of LDL-C.  Cresenciano Genre et al. Annamaria Helling. 5146;047(99): 2061-2068  (http://education.[employer].com/faq/FAQ164)    Total CHOL/HDL Ratio 4.3 <5.0 (calc)   Non-HDL Cholesterol (Calc) 115 <130 mg/dL (calc)    Comment: For patients with diabetes plus 1 major ASCVD risk  factor, treating to a non-HDL-C goal of <100 mg/dL  (LDL-C of <70 mg/dL) is considered a therapeutic  option.   Hemoglobin A1c      Status: None   Collection Time: 01/02/22  2:09 PM  Result Value Ref Range   Hgb A1c MFr Bld 5.5 <5.7 % of total Hgb    Comment: For the purpose of screening for the presence of diabetes: . <5.7%       Consistent with the absence of diabetes 5.7-6.4%    Consistent with increased risk for diabetes             (prediabetes) > or =6.5%  Consistent with diabetes . This assay result is consistent with a decreased risk of diabetes. . Currently, no consensus exists regarding use of hemoglobin A1c for diagnosis of diabetes in children. . According to American Diabetes Association (ADA) guidelines, hemoglobin A1c <7.0% represents optimal control in non-pregnant diabetic patients. Different metrics may apply to specific patient populations.  Standards of Medical Care in Diabetes(ADA). .    Mean Plasma Glucose 111 mg/dL   eAG (mmol/L) 6.2 mmol/L  PSA     Status: None   Collection Time: 01/02/22  2:09 PM  Result Value Ref Range   PSA 0.21 < OR = 4.00 ng/mL    Comment: The total PSA value from this assay system is  standardized against the WHO standard. The test  result will be approximately 20% lower when compared  to the equimolar-standardized total PSA (Beckman  Coulter). Comparison of serial PSA results should be  interpreted with this fact in mind. . This test was performed using the Siemens  chemiluminescent method. Values obtained from  different assay methods cannot be used interchangeably. PSA levels, regardless of value, should not be interpreted as absolute evidence of the presence or absence of disease.  TSH     Status: None   Collection Time: 01/02/22  2:09 PM  Result Value Ref Range   TSH 3.02 0.40 - 4.50 mIU/L  Alpha-Gal Panel     Status: Abnormal   Collection Time: 01/02/22  2:09 PM  Result Value Ref Range   Beef >100 (H) kU/L   CLASS 6    Allergen, Mutton, f88 >100 (H) kU/L   Class 6    Allergen, Pork, f26 54.20 (H) kU/L   CLASS 5     GALACTOSE-ALPHA-1,3-GALACTOSE IGE* >100 (H) <0.10 kU/L    Comment: Results above 0.1 kU/L indicate an allergen-specific IgE sensitization to galactose-a-1,3-galactose, and such patients are at risk for delayed allergic reactions following beef, pork, or lamb consumption. Circulating IgE antibodies may remain undetectable  despite a convincing clinical history because these antibodies may be directed towards allergens revealed or altered during industrial processing, cooking, or digestion and therefore do not exist in the original food for which the patient is tested. Sometimes individuals diagnosed with chronic urticaria may develop IgE antibodies directed against human thyroglobulin. Such antibodies may cross-react with the bovine thyroglobulin used in ImmunoCAP(R) Allergen o215, alpha-Gal, leading to a false-positive test result. A definitive diagnosis  should be based on the evaluation of both clinical and laboratory findings and not on any single diagnostic method. Additional information can be found at http://www.[employer]atives.com.cy .    Interpretation:     Status: None   Collection Time: 01/02/22  2:09 PM  Result Value Ref Range   Interpretation      Comment: . Specific                        Level of Allergen IGE Class      kU/L             Specific IGE Antibody  -----         ---------        -------------------   0              <0.10           Absent/Undetectable   0/1        0.10-0.34           Very Low Level   1          0.35-0.69           Low Level   2          0.70-3.49           Moderate Level   3          3.50-17.4           High Level   4          17.5-49.9           Very High Level   5            50-100            Very High Level   6              >100            Very High Level . The clinical relevance of allergen results of 0.10-0.34 kU/L are undetermined and intended for  specialist use. . Allergens denoted with a "**" include results using one or more analyte  specific reagents. In those cases, the test was developed and its analytical performance characteristics have been determined  by [employer]. It has not been cleared or approved by the U.S. Food and Drug Administration. This assay  has been v alidated pursuant to the [employer]  and is used for clinical purposes.     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 Discussed his constellation of symptoms very concerning for a telephone visit today. I advised that given severity of his concerns, best option is hospital ED evaluation if not improving within 24-48 hours, concerned for poor nutrition failure to thrive, dehydration. Also with hyponatremia on last lab suggestive again of poor nutrition and lack of fluid balance.  He likely needs IV Fluids if not improving to oral rehydration  Will order Zofran ODT for nausea medication  He can reduce Trulicity, first by stopping 1.5 weekly dose for now 1-2 weeks. New order Trulicity 2.[facility] dose [facility] His A1c was well controlled 5.5 too low at this point, impacting his appetite and causing constipation.  He can take miralax if need but suspect limited stool build up at this point.  Again reviewed emergency advice when to seek care if not improving.  Limit alcohol, but caution stop cold [place] would gradually reduce and improve oral rehydration for now.  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    Dispense:  30 tablet    Refill:  0   TRULICITY 3.81 RR/1.1AF SOPN    Sig: Inject 0.75 mg into the skin once a week.    Dispense:  6 mL    Refill:  1    Reduce dose from 1.5 down to 0.75    Follow-up:  Patient verbalizes understanding with the above medical recommendations including the limitation of remote medical advice.  Specific follow-up and call-back criteria were given for patient to follow-up or seek medical care more urgently if needed.   - Time spent in direct consultation with patient on phone: 15 minutes   Nobie Putnam, [facility] 01/08/2022, 2:38 PM

## 2022-01-10 ENCOUNTER — Telehealth: Payer: Self-pay | Admitting: Podiatry

## 2022-01-10 NOTE — Telephone Encounter (Signed)
Left message to call to pick up diabetic shoes

## 2022-01-16 ENCOUNTER — Other Ambulatory Visit: Payer: Self-pay

## 2022-01-16 ENCOUNTER — Emergency Department
Admission: EM | Admit: 2022-01-16 | Discharge: 2022-01-17 | Disposition: A | Discharge status: Home / Self Care | Payer: Medicare Other | Attending: Emergency Medicine | Admitting: Emergency Medicine

## 2022-01-16 DIAGNOSIS — R531 Weakness: Secondary | ICD-10-CM | POA: Diagnosis present

## 2022-01-16 DIAGNOSIS — F1010 Alcohol abuse, uncomplicated: Secondary | ICD-10-CM | POA: Insufficient documentation

## 2022-01-16 DIAGNOSIS — E860 Dehydration: Secondary | ICD-10-CM | POA: Insufficient documentation

## 2022-01-16 DIAGNOSIS — E871 Hypo-osmolality and hyponatremia: Secondary | ICD-10-CM | POA: Diagnosis not present

## 2022-01-16 DIAGNOSIS — R627 Adult failure to thrive: Secondary | ICD-10-CM | POA: Diagnosis not present

## 2022-01-16 DIAGNOSIS — E119 Type 2 diabetes mellitus without complications: Secondary | ICD-10-CM | POA: Diagnosis not present

## 2022-01-16 DIAGNOSIS — W57XXXS Bitten or stung by nonvenomous insect and other nonvenomous arthropods, sequela: Secondary | ICD-10-CM

## 2022-01-16 LAB — CBC WITH DIFFERENTIAL/PLATELET
Abs Immature Granulocytes: 0.15 10*3/uL — ABNORMAL HIGH (ref 0.00–0.07)
Basophils Absolute: 0 10*3/uL (ref 0.0–0.1)
Basophils Relative: 0 %
Eosinophils Absolute: 0 10*3/uL (ref 0.0–0.5)
Eosinophils Relative: 0 %
HCT: 42.3 % (ref 39.0–52.0)
Hemoglobin: 14.6 g/dL (ref 13.0–17.0)
Immature Granulocytes: 1 %
Lymphocytes Relative: 8 %
Lymphs Abs: 0.9 10*3/uL (ref 0.7–4.0)
MCH: 30.8 pg (ref 26.0–34.0)
MCHC: 34.5 g/dL (ref 30.0–36.0)
MCV: 89.2 fL (ref 80.0–100.0)
Monocytes Absolute: 0.8 10*3/uL (ref 0.1–1.0)
Monocytes Relative: 7 %
Neutro Abs: 9.5 10*3/uL — ABNORMAL HIGH (ref 1.7–7.7)
Neutrophils Relative %: 84 %
Platelets: 260 10*3/uL (ref 150–400)
RBC: 4.74 MIL/uL (ref 4.22–5.81)
RDW: 12.5 % (ref 11.5–15.5)
WBC: 11.4 10*3/uL — ABNORMAL HIGH (ref 4.0–10.5)
nRBC: 0 % (ref 0.0–0.2)

## 2022-01-16 NOTE — ED Triage Notes (Signed)
Pt brought in by [facility] from home. Pt states he's here because he's "tired of being fussed at". Pt notes weakness for about 3 weeks. Pt states he hasnt had a drink in about 3 weeks, used to drink a "12 pack a day". Pt had diarrhea on Sunday.

## 2022-01-17 ENCOUNTER — Emergency Department: Payer: Medicare Other

## 2022-01-17 ENCOUNTER — Telehealth: Payer: Self-pay | Admitting: Family Medicine

## 2022-01-17 DIAGNOSIS — A6920 Lyme disease, unspecified: Secondary | ICD-10-CM

## 2022-01-17 LAB — COMPREHENSIVE METABOLIC PANEL
ALT: 17 U/L (ref 0–44)
AST: 26 U/L (ref 15–41)
Albumin: 3.7 g/dL (ref 3.5–5.0)
Alkaline Phosphatase: 120 U/L (ref 38–126)
Anion gap: 14 (ref 5–15)
BUN: 15 mg/dL (ref 6–20)
CO2: 23 mmol/L (ref 22–32)
Calcium: 8.9 mg/dL (ref 8.9–10.3)
Chloride: 92 mmol/L — ABNORMAL LOW (ref 98–111)
Creatinine, Ser: 1.35 mg/dL — ABNORMAL HIGH (ref 0.61–1.24)
GFR, Estimated: >60 mL/min (ref >60)
Glucose, Bld: 147 mg/dL — ABNORMAL HIGH (ref 70–99)
Potassium: 3.1 mmol/L — ABNORMAL LOW (ref 3.5–5.1)
Sodium: 129 mmol/L — ABNORMAL LOW (ref 135–145)
Total Bilirubin: 1.3 mg/dL — ABNORMAL HIGH (ref 0.3–1.2)
Total Protein: 8.3 g/dL — ABNORMAL HIGH (ref 6.5–8.1)

## 2022-01-17 LAB — ETHANOL: Alcohol, Ethyl (B): <10 mg/dL (ref <10)

## 2022-01-17 LAB — MAGNESIUM: Magnesium: 2.2 mg/dL (ref 1.7–2.4)

## 2022-01-17 MED ORDER — DOXYCYCLINE HYCLATE 100 MG PO TABS
100.0000 mg | ORAL_TABLET | Freq: Once | ORAL | Status: AC
  Administered 2022-01-17: 100 mg via ORAL
  Filled 2022-01-17: qty 1

## 2022-01-17 MED ORDER — DOXYCYCLINE HYCLATE 100 MG PO CAPS: 100.0000 mg | ORAL_CAPSULE | Freq: Two times a day (BID) | ORAL | 0 refills | Status: DC

## 2022-01-17 MED ORDER — AMOXICILLIN 500 MG PO CAPS: 500.0000 mg | ORAL_CAPSULE | Freq: Three times a day (TID) | ORAL | 0 refills | Status: DC

## 2022-01-17 MED ORDER — SODIUM CHLORIDE 0.9 % IV BOLUS
1000.0000 mL | Freq: Once | INTRAVENOUS | Status: AC
  Administered 2022-01-17: 1000 mL via INTRAVENOUS

## 2022-01-17 NOTE — Discharge Instructions (Signed)
As we discussed, it looks like you are a little bit dehydrated and malnourished, but you do not meet any criteria for staying in the hospital.  We provided IV fluids and encourage you to eat and drink while you are at home, what ever interest you so that you can get some nutrition and fluids.  Your evaluation was generally reassuring, and we sent tests to check for [facility] spotted fever (RMSF) and Lyme disease given your recent tick exposure.  We talked about treating with doxycycline in the meantime, and you prefer to go ahead and take the antibiotics.  Please know that sometimes the doxycycline will irritate people stomachs.  You can fill the prescription and start taking it about please follow-up with your regular doctor, particularly once the lab results are back.    Return to the emergency department if you develop new or worsening symptoms that concern you.

## 2022-01-17 NOTE — Telephone Encounter (Signed)
He was prescribed Doxycycline by the Emergency Dept yesterday 8/2  They are covering for Lyme and RMSF with tick bites as a precaution.  Can you please notify patient that Doxycycline is typically the preferred antibiotic if tick borne illness is the concern?  He should be taking it with meals and avoid dairy products. Also always take with a full glass of water and remain upright for 30 min after taking, do not lay down or it can cause burning of throat or nausea as well.  As an option, I sent rx Amoxicillin capsules 500mg , take 1 capsule, 3 times a day or every 8 hours, for 14 days, as a back up plan, if he cannot tolerate the Doxycycline.  , DO [facility] 01/17/2022, 7:10 PM

## 2022-01-17 NOTE — ED Notes (Addendum)
2 gold tops sent to lab

## 2022-01-17 NOTE — Telephone Encounter (Signed)
Copied from CRM [id number]. Topic: General - Other >> Jan 17, 2022  3:27 PM Everette C wrote: Reason for CRM: The patient has recently taken their doxycycline (VIBRAMYCIN) 100 MG capsule [[id number]]  and shares that they believe the medication may be too strong for them   The patient has experienced nausea and stomach discomfort from the medication   The patient would like to be prescribed an alternative when possible   Please contact further

## 2022-01-17 NOTE — ED Provider Notes (Addendum)
[facility] Provider Note    Event Date/Time   First MD Initiated Contact with Patient 01/16/22 2314     (approximate)   History   Weakness   HPI  Alex Green is a 56 y.o. male whose medical history is most notable for diabetes and alcohol abuse.  His PCP is Dr. Saralyn Pilar.  The patient comes by EMS tonight for generalized weakness, malnutrition, decreased oral intake, weight loss.  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  He has had a couple of visits (in person and virtual) over the last few weeks with his PCP to discuss some of these issues.  However issues with weakness and impairment of balance go back longer than that.  He came in tonight because he said he was "tired of being fussed at" by his girlfriend who keeps telling him he needs to come to the hospital.  He said he does not have nausea exactly, he just does not have much appetite.  He discusses the alpha gal allergy multiple times and seems to feel that this is a major source of his issues.  He does not have any pain.  He has been losing weight over an extended period of time.  He stopped drinking alcohol recently and said he has not had any in about 2 weeks.  He has not had any tremors or hallucinations.     Physical Exam   Triage Vital Signs: ED Triage Vitals  Enc Vitals Group     BP 01/16/22 2314 (!) 154/92     Pulse Rate 01/16/22 2314 90     Resp 01/16/22 2314 17     Temp 01/16/22 2314 98.1 F (36.7 C)     Temp Source 01/16/22 2314 Oral     SpO2 01/16/22 2313 100 %     Weight 01/16/22 2315 71.7 kg (158 lb)     Height 01/16/22 2315 1.727 m (5\' 8" )     Head Circumference --      Peak Flow --      Pain Score 01/16/22 2315 7     Pain Loc --      Pain Edu? --      Excl. in GC? --     Most recent vital  signs: Vitals:   01/17/22 0100 01/17/22 0200  BP: (!) 167/91 (!) 154/77  Pulse: 80 76  Resp: (!) 24 (!) 24  Temp:    SpO2: 100% 100%     General: Awake, no distress.  Disheveled and unkempt. CV:  Good peripheral perfusion.  Normal heart sounds. Resp:  Normal effort.  Lungs are clear to auscultation.  No accessory muscle usage. Abd:  No distention.  No tenderness to palpation.  Cachectic and malnourished. Other:  No obvious focal neurological deficits.  Patient is moving his arms and lying on the bed with his legs crossed.  His hands and fingernails are very dirty and he is generally unkempt.  However he is awake and alert, mood and affect are essentially normal under the circumstances.  He is not having any signs of tremor or asterixis, no evidence of alcohol withdrawal.   ED Results / Procedures / Treatments   Labs (all labs ordered are listed, but only abnormal results are displayed) Labs Reviewed  COMPREHENSIVE METABOLIC PANEL - Abnormal;  Notable for the following components:      Result Value   Sodium 129 (*)    Potassium 3.1 (*)    Chloride 92 (*)    Glucose, Bld 147 (*)    Creatinine, Ser 1.35 (*)    Total Protein 8.3 (*)    Total Bilirubin 1.3 (*)    All other components within normal limits  CBC WITH DIFFERENTIAL/PLATELET - Abnormal; Notable for the following components:   WBC 11.4 (*)    Neutro Abs 9.5 (*)    Abs Immature Granulocytes 0.15 (*)    All other components within normal limits  MAGNESIUM  ETHANOL  URINALYSIS, COMPLETE (UACMP) WITH MICROSCOPIC  URINE DRUG SCREEN, QUALITATIVE ([facility] ONLY)  ROCKY MTN SPOTTED FVR ABS PNL(IGG+IGM)  LYME DISEASE SEROLOGY W/REFLEX     EKG  ED ECG REPORT I, Loleta Rose, the attending physician, personally viewed and interpreted this ECG.  Date: 01/16/2022 EKG Time: 23: 24 Rate: 91 Rhythm: normal sinus rhythm QRS Axis: Borderline left axis deviation. Intervals: normal ST/T Wave abnormalities: Non-specific ST  segment / T-wave changes, but no clear evidence of acute ischemia. Narrative Interpretation: no definitive evidence of acute ischemia; does not meet STEMI criteria.    RADIOLOGY I viewed and interpreted the patient's portable chest x-ray.  I see no evidence of focal consolidation or any suggestion of tumor.  I also read the radiologist's report, which confirmed no acute findings.    PROCEDURES:  Critical Care performed: No  .1-3 Lead EKG Interpretation  Performed by: Loleta Rose, MD Authorized by: Loleta Rose, MD     Interpretation: normal     ECG rate:  80   ECG rate assessment: normal     Rhythm: sinus rhythm     Ectopy: none     Conduction: normal      MEDICATIONS ORDERED IN ED: Medications  doxycycline (VIBRA-TABS) tablet 100 mg (has no administration in time range)  sodium chloride 0.9 % bolus 1,000 mL (0 mLs Intravenous Stopped 01/17/22 0156)     IMPRESSION / MDM / ASSESSMENT AND PLAN / ED COURSE  I reviewed the triage vital signs and the nursing notes.                              Differential diagnosis includes, but is not limited to, malnutrition, failure to thrive, electrolyte or metabolic abnormality, renal dysfunction, dehydration, complications of long-term alcohol abuse, undiagnosed neoplastic process, tickborne illness.  Patient's presentation is most consistent with acute presentation with potential threat to life or bodily function.  The patient is on the cardiac monitor to evaluate for evidence of arrhythmia and/or significant heart rate changes.  Labs/studies ordered: EKG, one-view portable chest x-ray, CMP, CBC with differential, urinalysis, urine drug screen, magnesium level, ethanol level, [place] spotted fever, Lyme disease serology.  Patient has no sign of infectious process at the moment.  His vital signs are essentially normal other than hypertension.  His mentation is normal.  He acknowledges that he has not been doing well for an  extended period of time but came in tonight because he states that he was tired of his girlfriend telling him that he should do so, rather than there being a new acute issue.  He acknowledges that he is not eating or drinking adequately.  He is very focused on alpha gal as the source of his issues.  I tried to educate him and his son and the son's  girlfriend who are in the room regarding the fact that alpha-gal is an allergy, not an illness.  The patient asked if he needs to be admitted to the hospital for antibiotics for the alpha-gal, and I again explained that it is not something that can be treated, but rather an allergy that he lives with.  However he is exhibiting no signs or symptoms of allergic reaction and has not been eating any beef or pork that would cause him to have a reaction.  I explained that if we are concerned about [facility] spotted fever (he has had no rash) or Lyme disease, we can treat with doxycycline while we are awaiting lab results, but I suspect that the probability of either 1 of these infections is quite low.  However he is receptive to the idea of starting empiric antibiotics since he says that it has been a few weeks of him feeling poorly after 3 tick bites and he believes the ticks were on him for at least 3 days.  I ordered normal saline 1 L IV bolus to begin rehydration anticipating that he is volume depleted.  I will reassess after labs are back.  Clinical Course as of 01/17/22 0240  Thu Jan 17, 2022  0040 CMP is notable for hyponatremia at 129 but this is actually better than it was as an outpatient 2 weeks ago when he was at 127.  His potassium is low at 3.1.  His creatinine is slightly elevated at 1.35 which likely represents an acute kidney injury requiring rehydration.  Otherwise his labs are generally reassuring.  He has a very mild leukocytosis of 11.4.  He has no LFT elevations that might point in the direction of tickborne illness.  He has not yet provided a  urine specimen.  Given his body habitus and relatively mild AKI, I suspect that the liter bolus of normal saline will help quite a bit.  As documented above, EKG is unremarkable and chest x-ray is unremarkable. [CF]  0234 I updated the patient and his family about his results and the need for outpatient follow-up.  I talked with him about the pros and cons of taking empiric doxycycline and he would like to take the Doxy.  I explained to him that the results will not come back for days and may be negative (the RMSF and Lyme tests).  He and his family said they understand.  He will follow-up as an outpatient.  He also requested information about how to establish a new primary care doctor and I gave him information about [facility].  I gave my usual and customary follow-up recommendations and return precautions. [CF]    Clinical Course User Index [CF] Loleta Rose, MD    Of note, ethanol level was ordered because the patient reports recently stopping drinking alcohol, but if his symptoms were the result of either ETOH intoxication or withdrawal, the level needs to be quantified.   FINAL CLINICAL IMPRESSION(S) / ED DIAGNOSES   Final diagnoses:  Failure to thrive in adult  Generalized weakness  Tick bite, sequela  Dehydration  Hyponatremia     Rx / DC Orders   ED Discharge Orders          Ordered    doxycycline (VIBRAMYCIN) 100 MG capsule  2 times daily        01/17/22 0240             Note:  This document was prepared using Dragon voice recognition software and may  include unintentional dictation errors.   Loleta Rose, MD 01/17/22 1607    Loleta Rose, MD 02/17/22 [phone]

## 2022-01-22 ENCOUNTER — Ambulatory Visit: Payer: Self-pay

## 2022-01-22 ENCOUNTER — Ambulatory Visit (INDEPENDENT_AMBULATORY_CARE_PROVIDER_SITE_OTHER): Payer: Medicare Other | Admitting: Family Medicine

## 2022-01-22 ENCOUNTER — Encounter: Payer: Self-pay | Admitting: Family Medicine

## 2022-01-22 ENCOUNTER — Telehealth: Payer: Self-pay | Admitting: *Deleted

## 2022-01-22 DIAGNOSIS — R627 Adult failure to thrive: Secondary | ICD-10-CM | POA: Diagnosis not present

## 2022-01-22 DIAGNOSIS — E440 Moderate protein-calorie malnutrition: Secondary | ICD-10-CM

## 2022-01-22 DIAGNOSIS — E871 Hypo-osmolality and hyponatremia: Secondary | ICD-10-CM

## 2022-01-22 DIAGNOSIS — F1021 Alcohol dependence, in remission: Secondary | ICD-10-CM | POA: Diagnosis not present

## 2022-01-22 DIAGNOSIS — E1140 Type 2 diabetes mellitus with diabetic neuropathy, unspecified: Secondary | ICD-10-CM

## 2022-01-22 DIAGNOSIS — R29898 Other symptoms and signs involving the musculoskeletal system: Secondary | ICD-10-CM

## 2022-01-22 NOTE — Patient Outreach (Signed)
  Care Coordination   01/22/2022 Name: Alex Green MRN: [id number] DOB: 08/19/1965   Care Coordination Outreach Attempts:  An unsuccessful telephone outreach was attempted today to offer the patient information about available care coordination services as a benefit of their health plan.   Follow Up Plan:  Additional outreach attempts will be made to offer the patient care coordination information and services.   Encounter Outcome:  No Answer  Care Coordination Interventions Activated:  No   Care Coordination Interventions:  No, not indicated    Tarig Zimmers, LCSW Clinical Social Worker  [facility] [phone]

## 2022-01-22 NOTE — Telephone Encounter (Signed)
  Chief Complaint: Weakness Symptoms: Weakness - Cough since leaving the ED Frequency: 30 days Pertinent Negatives: Patient denies  Disposition: [] ED /[] Urgent Care (no appt availability in office) / [x] Appointment(In office/virtual)/ []  [facility]/ [] Home Care/ [] Refused Recommended Disposition /[] [facility]/ []  Follow-up with PCP Additional Notes: PT states that he has continued weakness. Pt states that he is unable to bed out of bed quickly to get to the bathroom. He now has a bedside commode which is helpful. PT feels he needs to be admitted to hospital or a assisted living environment, or home health assistance for care as he is not able to care for himself and his partner is unable to take care of him fully.   Reason for Disposition  [1] MODERATE weakness (i.e., interferes with work, school, normal activities) AND [2] cause unknown  (Exceptions: Weakness from acute minor illness or poor fluid intake; weakness is chronic and not worse.)  Answer Assessment - Initial Assessment Questions 1. DESCRIPTION: "Describe how you are feeling."     weakness 2. SEVERITY: "How bad is it?"  "Can you stand and walk?"   - MILD (0-3): Feels weak or tired, but does not interfere with work, school or normal activities.   - MODERATE (4-7): Able to stand and walk; weakness interferes with work, school, or normal activities.   - SEVERE (8-10): Unable to stand or walk; unable to do usual activities.     8/10 3. ONSET: "When did these symptoms begin?" (e.g., hours, days, weeks, months)     30 days ago 4. CAUSE: "What do you think is causing the weakness or fatigue?" (e.g., not drinking enough fluids, medical problem, trouble sleeping)     Unsure 5. NEW MEDICINES:  "Have you started on any new medicines recently?" (e.g., opioid pain medicines, benzodiazepines, muscle relaxants, antidepressants, antihistamines, neuroleptics, beta blockers)     yes 6. OTHER SYMPTOMS: "Do you have any other  symptoms?" (e.g., chest pain, fever, cough, SOB, vomiting, diarrhea, bleeding, other areas of pain)     Cough - since coming home from ED 7. PREGNANCY: "Is there any chance you are pregnant?" "When was your last menstrual period?"     Na  Protocols used: Weakness (Generalized) and Fatigue-A-AH

## 2022-01-22 NOTE — Progress Notes (Signed)
Subjective:    Patient ID: Alex Green, male    DOB: [birth year], 56 y.o.   MRN: [id number]  Alex Green is a 56 y.o. male presenting on 01/22/2022 for Hospitalization Follow-up  Virtual / Telehealth Encounter - Telephone Visit The purpose of this virtual visit is to provide medical care while limiting exposure to the novel coronavirus (COVID19) for both patient and office staff.  Consent was obtained for remote visit:  Yes.   Answered questions that patient had about telehealth interaction:  Yes.   I discussed the limitations, risks, security and privacy concerns of performing an evaluation and management service by video/telephone. I also discussed with the patient that there may be a patient responsible charge related to this service. The patient expressed understanding and agreed to proceed.  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 Uses bedside commode now due to difficulty with mobility to bathroom COVID testing, negative at hospital ED  Side effects on Doxycycline He will pick up Amoxicillin now Still concerned about tick bite  19 yr without drink, then injured at work, he was given 10 lb lifting restriction, and he was "retiredDevelopment worker, international aid and disabled.  He has difficulty managing his chronic pain. In past on medications, has come off of Duloxetine, Lyrica since they were not helping Chronic pain severe impacting his ambulation and function  Off all meds except ambien CR 12.5 taking x 2 nightly sometimes for sleep if one doesn't work.   ED FOLLOW-UP  VISIT  Hospital/Location: [facility] Date of ED Visit: 01/17/22  FOLLOW-UP  - ED provider note and record have been reviewed - Patient presents today about 5 days after recent ED visit. Brief summary of recent course, patient had symptoms of weakness malaise fatigue pain poor oral intake, presented to ED on 01/17/22, testing in ED with labs and x-ray treated for tick borne illness w/ Doxy, ultimately discharged . - Today reports overall has had persistent difficulty after discharge from ED, he has limited support at home unable to do all of his ADLs. Difficulty mobility to bathroom, limited ability for meal prep.   I have reviewed the discharge medication list, and have reconciled the current and discharge medications today.      10/05/2021   10:32 AM 06/26/2021    3:20 PM 02/14/2021    1:54 PM  Depression screen PHQ 2/9  Decreased Interest 1 1 1   Down, Depressed, Hopeless 1 1 1   PHQ - 2 Score 2 2 2   Altered sleeping 1 1 1   Tired, decreased energy 1 3 3   Change in appetite 1 1 1   Feeling bad or failure about yourself  1 1 1   Trouble concentrating 0 0 1  Moving slowly or fidgety/restless 0 0 0  Suicidal thoughts 0 0 0  PHQ-9 Score 6 8 9   Difficult doing work/chores Somewhat difficult Not difficult at all Somewhat difficult    Social History   Tobacco Use   Smoking status: Every Day    Packs/day: 1.50    Years: 10.00    Total pack years: 15.00    Types: Cigarettes   Smokeless tobacco:  Never  Substance Use Topics   Alcohol use: Yes    Alcohol/week: 50.0 standard drinks of alcohol    Types: 50 Cans of beer per week   Drug use: Not Currently    Review of Systems Per HPI unless specifically indicated above     Objective:    There were no vitals taken for this visit.  Wt Readings from Last 3 Encounters:  01/16/22 158 lb (71.7 kg)  01/08/22 159 lb (72.1 kg)  01/02/22 159 lb (72.1 kg)    Physical Exam  Telephone visit today, no physical exam  Results for orders placed or  performed during the hospital encounter of 01/16/22  Comprehensive metabolic panel  Result Value Ref Range   Sodium 129 (L) 135 - 145 mmol/L   Potassium 3.1 (L) 3.5 - 5.1 mmol/L   Chloride 92 (L) 98 - 111 mmol/L   CO2 23 22 - 32 mmol/L   Glucose, Bld 147 (H) 70 - 99 mg/dL   BUN 15 6 - 20 mg/dL   Creatinine, Ser 1.96 (H) 0.61 - 1.24 mg/dL   Calcium 8.9 8.9 - 22.2 mg/dL   Total Protein 8.3 (H) 6.5 - 8.1 g/dL   Albumin 3.7 3.5 - 5.0 g/dL   AST 26 15 - 41 U/L   ALT 17 0 - 44 U/L   Alkaline Phosphatase 120 38 - 126 U/L   Total Bilirubin 1.3 (H) 0.3 - 1.2 mg/dL   GFR, Estimated >97 >98 mL/min   Anion gap 14 5 - 15  CBC with Differential  Result Value Ref Range   WBC 11.4 (H) 4.0 - 10.5 K/uL   RBC 4.74 4.22 - 5.81 MIL/uL   Hemoglobin 14.6 13.0 - 17.0 g/dL   HCT 92.1 19.4 - 17.4 %   MCV 89.2 80.0 - 100.0 fL   MCH 30.8 26.0 - 34.0 pg   MCHC 34.5 30.0 - 36.0 g/dL   RDW 08.1 44.8 - 18.5 %   Platelets 260 150 - 400 K/uL   nRBC 0.0 0.0 - 0.2 %   Neutrophils Relative % 84 %   Neutro Abs 9.5 (H) 1.7 - 7.7 K/uL   Lymphocytes Relative 8 %   Lymphs Abs 0.9 0.7 - 4.0 K/uL   Monocytes Relative 7 %   Monocytes Absolute 0.8 0.1 - 1.0 K/uL   Eosinophils Relative 0 %   Eosinophils Absolute 0.0 0.0 - 0.5 K/uL   Basophils Relative 0 %   Basophils Absolute 0.0 0.0 - 0.1 K/uL   Immature Granulocytes 1 %   Abs Immature Granulocytes 0.15 (H) 0.00 - 0.07 K/uL  Magnesium  Result Value Ref Range   Magnesium 2.2 1.7 - 2.4 mg/dL  Ethanol  Result Value Ref Range   Alcohol, Ethyl (B) <10 <10 mg/dL      Assessment & Plan:   Problem List Items Addressed This Visit     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  He has long history of alcohol dependence, but has remained in remission on this without resumed alcohol consumption.  He has chronic hyponatremia and low potassium may be due to alcohol history but mainly with malnutrition  His Diabetes is controlled off medication due to poor nutrition and weight loss.  Advised him I am concerned that I am not able to resolve these issues, he has failed multiple medication options for managing his pain and has come off of all rx medications on his own due to side effects or not helping.  I recommended that ED may be best place to return to if he continues to fail with functioning and ADLs  I will urgently contact Chronic Care Management Team for further assistance, they hopefully can contact him this week and help me assess his in home situation, as a lot of his barriers to care seem to be with social situation lack of caregiver support. As his medical conditions are more chronic in nature and difficult to manage, but he may need escalation of care if eligible, or consider adult protective services if the situation is warranted.  Orders Placed This Encounter  Procedures   AMB Referral to [facility] Coordinaton    Referral Priority:   Routine    Referral Type:   Consultation    Referral Reason:   Care Coordination    Number of Visits Requested:   1     No orders of the defined types were placed in this encounter.   Follow up plan: No follow-ups on file.  Patient verbalizes understanding with the above medical recommendations including the limitation of remote medical advice.  Specific follow-up and call-back criteria were given for patient to follow-up or seek medical care more urgently if needed.  Total duration of direct patient care provided via  telephone: 20 minutes   Alex Pilar, DO [facility] 01/22/2022, 3:59 PM

## 2022-01-22 NOTE — Telephone Encounter (Signed)
Appt today at 4.

## 2022-01-23 ENCOUNTER — Telehealth: Payer: Self-pay | Admitting: *Deleted

## 2022-01-23 NOTE — Chronic Care Management (AMB) (Signed)
  Chronic Care Management   Note  01/23/2022 Name: Alex Green MRN: [id number] DOB: 02-Feb-1966  Alex Green is a 56 y.o. year old male who is a primary care patient of Olin Hauser, DO. I reached out to Alex Green by phone today in response to a referral sent by Mr. KOBE OFALLON PCP.  Mr. Leckey was given information about Chronic Care Management services today including:  CCM service includes personalized support from designated clinical staff supervised by his physician, including individualized plan of care and coordination with other care providers 24/7 contact phone numbers for assistance for urgent and routine care needs. Service will only be billed when office clinical staff spend 20 minutes or more in a month to coordinate care. Only one practitioner may furnish and bill the service in a calendar month. The patient may stop CCM services at any time (effective at the end of the month) by phone call to the office staff. The patient is responsible for co-pay (up to 20% after annual deductible is met) if co-pay is required by the individual health plan.   Patient agreed to services and verbal consent obtained.   Follow up plan: Telephone appointment with care management team member scheduled for: 01/29/2022 and 02/04/2022  Julian Hy, [place] Direct Dial: [phone]

## 2022-01-29 ENCOUNTER — Ambulatory Visit: Payer: Self-pay | Admitting: *Deleted

## 2022-01-29 ENCOUNTER — Telehealth: Payer: Medicare Other

## 2022-01-29 DIAGNOSIS — R29898 Other symptoms and signs involving the musculoskeletal system: Secondary | ICD-10-CM

## 2022-01-29 NOTE — Chronic Care Management (AMB) (Signed)
  Care Management   Follow Up Note   01/29/2022 Name: Alex Green MRN: [id number] DOB: 1966-04-09   Referred by: [name], DO Reason for referral : Chronic Care Management   An unsuccessful telephone outreach was attempted today. The patient was referred to the case management team for assistance with care management and care coordination.   Follow Up Plan:  Appointment to be re-scheduled by careguide  Verna Czech, LCSW Clinical Social Worker  [facility] [phone]

## 2022-02-04 ENCOUNTER — Ambulatory Visit: Payer: Self-pay | Admitting: *Deleted

## 2022-02-04 ENCOUNTER — Telehealth: Payer: Medicare Other

## 2022-02-04 ENCOUNTER — Ambulatory Visit (INDEPENDENT_AMBULATORY_CARE_PROVIDER_SITE_OTHER): Payer: Medicare Other

## 2022-02-04 ENCOUNTER — Other Ambulatory Visit: Payer: Self-pay | Admitting: Family Medicine

## 2022-02-04 DIAGNOSIS — R627 Adult failure to thrive: Secondary | ICD-10-CM

## 2022-02-04 DIAGNOSIS — R296 Repeated falls: Secondary | ICD-10-CM

## 2022-02-04 DIAGNOSIS — F411 Generalized anxiety disorder: Secondary | ICD-10-CM

## 2022-02-04 DIAGNOSIS — E1169 Type 2 diabetes mellitus with other specified complication: Secondary | ICD-10-CM

## 2022-02-04 DIAGNOSIS — M545 Low back pain, unspecified: Secondary | ICD-10-CM

## 2022-02-04 DIAGNOSIS — R29898 Other symptoms and signs involving the musculoskeletal system: Secondary | ICD-10-CM

## 2022-02-04 DIAGNOSIS — I10 Essential (primary) hypertension: Secondary | ICD-10-CM

## 2022-02-04 DIAGNOSIS — F331 Major depressive disorder, recurrent, moderate: Secondary | ICD-10-CM

## 2022-02-04 DIAGNOSIS — G8929 Other chronic pain: Secondary | ICD-10-CM

## 2022-02-04 DIAGNOSIS — G2581 Restless legs syndrome: Secondary | ICD-10-CM

## 2022-02-04 DIAGNOSIS — R2689 Other abnormalities of gait and mobility: Secondary | ICD-10-CM

## 2022-02-04 DIAGNOSIS — E1140 Type 2 diabetes mellitus with diabetic neuropathy, unspecified: Secondary | ICD-10-CM

## 2022-02-04 MED ORDER — ROPINIROLE HCL 0.5 MG PO TABS: 0.5000 mg | ORAL_TABLET | Freq: Every day | ORAL | 1 refills | Status: DC

## 2022-02-04 NOTE — Chronic Care Management (AMB) (Signed)
Chronic Care Management    Clinical Social Work Note  02/04/2022 Name: Alex Green MRN: [id number] DOB: [birth year]  Alex Green is a 56 y.o. year old male who is a primary care patient of Olin Hauser, DO. The CCM team was consulted to assist the patient with chronic disease management and/or care coordination needs related to: [employer] .   Engaged with patient by telephone for initial visit in response to provider referral for social work chronic care management and care coordination services.   Consent to Services:  The patient was given the following information about Chronic Care Management services today, agreed to services, and gave verbal consent: 1. CCM service includes personalized support from designated clinical staff supervised by the primary care provider, including individualized plan of care and coordination with other care providers 2. 24/7 contact phone numbers for assistance for urgent and routine care needs. 3. Service will only be billed when office clinical staff spend 20 minutes or more in a month to coordinate care. 4. Only one practitioner may furnish and bill the service in a calendar month. 5.The patient may stop CCM services at any time (effective at the end of the month) by phone call to the office staff. 6. The patient will be responsible for cost sharing (co-pay) of up to 20% of the service fee (after annual deductible is met). Patient agreed to services and consent obtained.  Patient agreed to services and consent obtained.   Assessment: Review of patient past medical history, allergies, medications, and health status, including review of relevant consultants reports was performed today as part of a comprehensive evaluation and provision of chronic care management and care coordination services.     SDOH (Social Determinants of Health) assessments and interventions performed:  SDOH Interventions    Flowsheet Row Most Recent Value   SDOH Interventions   Food Insecurity Interventions Intervention Not Indicated  Social Connections Interventions Intervention Not Indicated        Advanced Directives Status: Not addressed in this encounter.  CCM Care Plan  Allergies  Allergen Reactions   Other     "Bioxin gives me a yeast infection"    Outpatient Encounter Medications as of 02/04/2022  Medication Sig   amoxicillin (AMOXIL) 500 MG capsule Take 1 capsule (500 mg total) by mouth 3 (three) times daily.   clotrimazole-betamethasone (LOTRISONE) cream Apply 2 times a day for worsening flare dry skin dermatitis of toes/feet, may re-use daily up to 1 week as needed.   fluticasone (FLONASE) 50 MCG/ACT nasal spray PLACE 2 SPRAYS INTO BOTH NOSTRILS DAILY FOR 4-6 WEEKS THEN STOP AND USE SEASONALLY OR AS NEEDED.   levETIRAcetam (KEPPRA) 500 MG tablet Take 1 tablet (500 mg total) by mouth 2 (two) times daily for 7 days.   levETIRAcetam (KEPPRA) 500 MG tablet levetiracetam 500 mg tablet  TAKE 1 TABLET BY MOUTH TWICE A DAY FOR 7 DAYS (Patient not taking: Reported on 10/05/2021)   ondansetron (ZOFRAN-ODT) 4 MG disintegrating tablet Take 1 tablet (4 mg total) by mouth every 8 (eight) hours as needed for nausea or vomiting.   rOPINIRole (REQUIP) 0.5 MG tablet Take 1 tablet (0.5 mg total) by mouth at bedtime. May increase to 2 pills at bedtime for dose 65m nightly if needed.   zolpidem (AMBIEN CR) 12.5 MG CR tablet Take 1 tablet (12.5 mg total) by mouth at bedtime as needed for sleep.   No facility-administered encounter medications on file as of 02/04/2022.    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[place]) 06/24/2020   Controlled type 2 diabetes mellitus with neuropathy ([place]) 04/21/2020   Essential hypertension 04/21/2020   GAD (generalized anxiety disorder) 04/21/2020   GERD (gastroesophageal reflux disease) 04/21/2020   Tobacco abuse 04/21/2020   Alcohol dependence in early full remission ([place]) 04/21/2020   Status post left foot surgery 09/04/2017   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   Accidentally caught in or between objects 08/04/2012    Conditions to be addressed/monitored: Type 2 diabetes, Weakness of both lower extremities, Failure to thrive Protein-calorie malnutrition, moderate  ; Limited social support and ADL IADL limitations  Care Plan : General Social Work (Adult)  Updates made by [employer], Baruch Gouty M, LCSW since 02/04/2022 12:00 AM     Problem: CHL AMB "PATIENT-SPECIFIC PROBLEM"   Priority: High  Onset Date: 02/04/2022  Note:   CARE PLAN ENTRY (see longitudinal plan of care for additional care plan information)  Current Barriers:  Patient with Type 2 diabetes, Weakness of both lower extremities, Failure to thrive Protein-calorie malnutrition, moderate in need of assistance with connection to community resources  Knowledge deficits and need for support, education and care coordination related to community resources support  Limited social support, ADL IADL limitations, Limited access to caregiver, and Lacks knowledge of community resource: related to in home care support Patient discussed having limited in home support and needs assistance with his ADL's. Patient confirmed having a live in girlfriend along with friends however they are only able to assist on a limited basis  Clinical Goal(s)  Over the next 90 days, patient will follow up with [facility] and/or [facility] to discuss options and costs related to in  home care Interventions provided by LCSW:  Assessed patient's care coordination needs related to in home care needs and discussed ongoing care management follow up  Provided patient with information about possible agencies that could provide in home care  Advised patient to call the agencies to discuss his needs, availability and costs Confirmed that patient receives partial Disability and workman's compensation Collaborated with appropriate clinical care team members regarding patient needs Solution-Focused Strategies employed:  Active listening / Reflection utilized  Emotional Support Provided   Patient Self Care Activities & Deficits:  Patient is unable to independently navigate community resource options without care coordination support  Acknowledges deficits and is motivated to resolve concern  Patient is able to contact in home care agencies provided as discussed today Unable to perform ADLs independently Unable to perform IADLs independently Calls provider office for new concerns or questions  Initial goal documentation        Follow Up Plan: SW will follow up with patient by phone over the next 14 business days      [place], [place] Worker  [facility] [phone]

## 2022-02-04 NOTE — Patient Instructions (Signed)
Visit Information   Thank you for taking time to visit with me today. Please don't hesitate to contact me if I can be of assistance to you before our next scheduled telephone appointment.  Following are the goals we discussed today:  (Copy and paste patient goals from clinical care plan here)  Our next appointment is by telephone on 02-26-2022 at 10 am  Please call the care guide team at [phone] if you need to cancel or reschedule your appointment.   If you are experiencing a Mental Health or [place] or need someone to talk to, please call the Suicide and Crisis Lifeline: 988 call the [place] National Suicide Prevention Lifeline: 640-505-9701 or TTY: (620)241-7959 TTY 520-278-3166) to talk to a trained counselor call 1-800-273-TALK (toll free, 24 hour hotline)   Following is a copy of your full care plan:  Care Plan : RNCM: Development of Plan of care for Chronic Disease Management and Care Coordination Needs  Updates made by Vanita Ingles, RN since 02/04/2022 12:00 AM     Problem: RNCM: Development of plan of care for: Depression, Anxiety, Failure to thrive, HTN, HLD, Frequent falls   Priority: High     Long-Range Goal: RNCM: Effctive Management of plan of care for: Depression, Anxiety, Failure to thrive, HTN, HLD, Frequent falls, pain   Start Date: 02/04/2022  Expected End Date: 02/05/2023  Priority: High  Note:   Current Barriers:  Care Coordination needs related to Limited social support, Level of care concerns, ADL IADL limitations, Mental Health Concerns , Inability to perform ADL's independently, Inability to perform IADL's independently, and frequent falls  Lacks caregiver support.        Falls  RNCM Clinical Goal(s):  Patient will verbalize basic understanding of HTN, HLD, Anxiety, Depression, and Failure to Thrive, and frequent falls disease process and self health management plan as evidenced by meeting the patients needs, keeping appointments, calling  the provider or team for questions, concerns, or new needs take all medications exactly as prescribed and will call provider for medication related questions as evidenced by taking medications as directed and calling the pharmacist for questions    work with social worker to address Limited social support, Level of care concerns, ADL IADL limitations, Mental Health Concerns , and Inability to perform IADL's independently related to the management of HTN, HLD, Anxiety, Depression, and failure to thrive and frequent falls as evidenced by review of EMR and patient or social worker report     demonstrate a decrease in HTN, HLD, Anxiety, Depression, and failure to thrive and frequent falls exacerbations as evidenced by stable VS, decreased falls, no acute exacerbations, and working with the CCM team to optimize the plan of care for effective management of the patients chronic conditions demonstrate ongoing self health care management ability of effective management of chronic conditions as evidenced by     through collaboration with RN Care manager, provider, and care team.   Interventions: 1:1 collaboration with primary care provider regarding development and update of comprehensive plan of care as evidenced by provider attestation and co-signature Inter-disciplinary care team collaboration (see longitudinal plan of care) Evaluation of current treatment plan related to  self management and patient's adherence to plan as established by provider   Falls:  (Status: New goal.) Long Term Goal  Provided written and verbal education re: potential causes of falls and Fall prevention strategies Reviewed medications and discussed potential side effects of medications such as dizziness and frequent urination Advised patient of  importance of notifying provider of falls. 02-04-2022: The patient has had a fall since 01-22-2022 with telephone visit with the pcp. The patient states that he loses his balance a lot and has  frequent falls. Education on notifying the provider of new falls or safety concerns Assessed for signs and symptoms of orthostatic hypotension Assessed for falls since last encounter. 02-04-2022: Has had a new fall but could not remember the exact date. Has been since phone visit with the pcp on 01-22-2022 Assessed patients knowledge of fall risk prevention secondary to previously provided education Advised patient to discuss safety and fall concerns with provider Screening for signs and symptoms of depression related to chronic disease state Assessed social determinant of health barriers  Anxiety, Depression, Failure to Thrive  (Status: New goal.) Long Term Goal  Evaluation of current treatment plan related to Anxiety, Depression, and failure to thrive , Limited social support, Level of care concerns, ADL IADL limitations, Inability to perform ADL's independently, Inability to perform IADL's independently, and frequent falls  self-management and patient's adherence to plan as established by provider. Discussed plans with patient for ongoing care management follow up and provided patient with direct contact information for care management team Advised patient to call the office for changes in his mood, anxiety, depression, or mental health; Provided education to patient re: working with the CCM team to effectively manage his chronic health needs. The patient feels if he had some help in the home this would help him a lot. He cannot empty his bedside commode and his girlfriend that lives with him is unable to do this also due to her health issues. Education and support. ; Reviewed medications with patient and discussed compliance. States he really only takes Ambien ; Collaborated with pcp and LCSW  regarding request for Requip for RLS and expressed needs for in home care and help. Inbasket message sent to the pcp and LCSW for assistance with expressed needs. Will follow up accordingly. ; Provided patient  with emotional educational materials related to anxiety, depression, and failure to thrive; Social Work referral for assistance with disability and possible Medicaid filing ; Discussed plans with patient for ongoing care management follow up and provided patient with direct contact information for care management team; Advised patient to discuss changes in his mood, or mental health needs  with provider; Screening for signs and symptoms of depression related to chronic disease state;   Hyperlipidemia:  (Status: New goal.) Long Term Goal  Lab Results  Component Value Date   CHOL 150 01/02/2022   HDL 35 (L) 01/02/2022   LDLCALC 94 01/02/2022   TRIG 115 01/02/2022   CHOLHDL 4.3 01/02/2022     Medication review performed; medication list updated in electronic medical record.  Provider established cholesterol goals reviewed; Counseled on importance of regular laboratory monitoring as prescribed; Provided HLD educational materials; Reviewed role and benefits of statin for ASCVD risk reduction; Reviewed importance of limiting foods high in cholesterol;  Hypertension: (Status: New goal.) Long Term Goal  Last practice recorded BP readings:  BP Readings from Last 3 Encounters:  01/17/22 (!) 154/77  01/02/22 132/80  06/26/21 137/65  Most recent eGFR/CrCl:  Lab Results  Component Value Date   EGFR 74 01/02/2022    No components found for: "CRCL"  Evaluation of current treatment plan related to hypertension self management and patient's adherence to plan as established by provider;   Reviewed prescribed diet Heart Healthy diet  Discussed plans with patient for ongoing care management follow  up and provided patient with direct contact information for care management team; Advised patient, providing education and rationale, to monitor blood pressure daily and record, calling PCP for findings outside established parameters;  Advised patient to discuss changes in heart health or questions with  provider; Provided education on prescribed diet heart healthy diet ;  Discussed complications of poorly controlled blood pressure such as heart disease, stroke, circulatory complications, vision complications, kidney impairment, sexual dysfunction;    Pain:  (Status: New goal.) Long Term Goal  Pain assessment performed. 02-04-2022: The patient states he has pain in his back and ribs. Rates his pain level at at 7 on a scale of 0-10 Medications reviewed Reviewed provider established plan for pain management; Discussed importance of adherence to all scheduled medical appointments; Counseled on the importance of reporting any/all new or changed pain symptoms or management strategies to pain management provider; Advised patient to report to care team affect of pain on daily activities; Discussed use of relaxation techniques and/or diversional activities to assist with pain reduction (distraction, imagery, relaxation, massage, acupressure, TENS, heat, and cold application; Reviewed with patient prescribed pharmacological and nonpharmacological pain relief strategies; Advised patient to discuss changes in level or intensity of pain, or unresolved pain with provider;   Patient Goals/Self-Care Activities: Take medications as prescribed   Attend all scheduled provider appointments Call pharmacy for medication refills 3-7 days in advance of running out of medications Perform all self care activities independently  Call provider office for new concerns or questions  Work with the social worker to address care coordination needs and will continue to work with the clinical team to address health care and disease management related needs call the Suicide and Crisis Lifeline: 988 call the [place] National Suicide Prevention Lifeline: (913)277-1331 or TTY: 330-649-4212 TTY 956-474-1578) to talk to a trained counselor call 1-800-273-TALK (toll free, 24 hour hotline) if experiencing a Mental Health or [place] about high blood pressure call doctor for signs and symptoms of high blood pressure develop an action plan for high blood pressure keep all doctor appointments report new symptoms to your doctor eat more whole grains, fruits and vegetables, lean meats and healthy fats - call doctor when you experience any new symptoms - go to all doctor appointments as scheduled - adhere to prescribed diet: heart healthy diet       Consent to CCM Services: Mr. Gertz was given information about Chronic Care Management services including:  CCM service includes personalized support from designated clinical staff supervised by his physician, including individualized plan of care and coordination with other care providers 24/7 contact phone numbers for assistance for urgent and routine care needs. Service will only be billed when office clinical staff spend 20 minutes or more in a month to coordinate care. Only one practitioner may furnish and bill the service in a calendar month. The patient may stop CCM services at any time (effective at the end of the month) by phone call to the office staff. The patient will be responsible for cost sharing (co-pay) of up to 20% of the service fee (after annual deductible is met).  Patient agreed to services and verbal consent obtained.   The patient verbalized understanding of instructions, educational materials, and care plan provided today and DECLINED offer to receive copy of patient instructions, educational materials, and care plan.   Telephone follow up appointment with care management team member scheduled for: 02-26-2022 at 10 am  [place], MSN, CCM Community  [place] Mobile: [phone]

## 2022-02-04 NOTE — Chronic Care Management (AMB) (Signed)
Chronic Care Management   CCM RN Visit Note  02/04/2022 Name: ESHAAN Green MRN: [id number] DOB: 1965/09/23  Subjective: Alex Green is a 56 y.o. year old male who is a primary care patient of Olin Hauser, DO. The care management team was consulted for assistance with disease management and care coordination needs.    Engaged with patient by telephone for initial visit in response to provider referral for case management and/or care coordination services.   Consent to Services:  The patient was given the following information about Chronic Care Management services today, agreed to services, and gave verbal consent: 1. CCM service includes personalized support from designated clinical staff supervised by the primary care provider, including individualized plan of care and coordination with other care providers 2. 24/7 contact phone numbers for assistance for urgent and routine care needs. 3. Service will only be billed when office clinical staff spend 20 minutes or more in a month to coordinate care. 4. Only one practitioner may furnish and bill the service in a calendar month. 5.The patient may stop CCM services at any time (effective at the end of the month) by phone call to the office staff. 6. The patient will be responsible for cost sharing (co-pay) of up to 20% of the service fee (after annual deductible is met). Patient agreed to services and consent obtained.  Patient agreed to services and verbal consent obtained.   Assessment: Review of patient past medical history, allergies, medications, health status, including review of consultants reports, laboratory and other test data, was performed as part of comprehensive evaluation and provision of chronic care management services.   SDOH (Social Determinants of Health) assessments and interventions performed:    CCM Care Plan  Allergies  Allergen Reactions   Other     "Bioxin gives me a yeast infection"     Outpatient Encounter Medications as of 02/04/2022  Medication Sig   zolpidem (AMBIEN CR) 12.5 MG CR tablet Take 1 tablet (12.5 mg total) by mouth at bedtime as needed for sleep.   amoxicillin (AMOXIL) 500 MG capsule Take 1 capsule (500 mg total) by mouth 3 (three) times daily.   clotrimazole-betamethasone (LOTRISONE) cream Apply 2 times a day for worsening flare dry skin dermatitis of toes/feet, may re-use daily up to 1 week as needed.   fluticasone (FLONASE) 50 MCG/ACT nasal spray PLACE 2 SPRAYS INTO BOTH NOSTRILS DAILY FOR 4-6 WEEKS THEN STOP AND USE SEASONALLY OR AS NEEDED.   levETIRAcetam (KEPPRA) 500 MG tablet Take 1 tablet (500 mg total) by mouth 2 (two) times daily for 7 days.   levETIRAcetam (KEPPRA) 500 MG tablet levetiracetam 500 mg tablet  TAKE 1 TABLET BY MOUTH TWICE A DAY FOR 7 DAYS (Patient not taking: Reported on 10/05/2021)   ondansetron (ZOFRAN-ODT) 4 MG disintegrating tablet Take 1 tablet (4 mg total) by mouth every 8 (eight) hours as needed for nausea or vomiting.   No facility-administered encounter medications on file as of 02/04/2022.    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[place]) 06/24/2020  Controlled type 2 diabetes mellitus with neuropathy ([place]) 04/21/2020   Essential hypertension 04/21/2020   GAD (generalized anxiety disorder) 04/21/2020   GERD (gastroesophageal reflux disease) 04/21/2020   Tobacco abuse 04/21/2020   Alcohol dependence in early full remission ([place]) 04/21/2020    Status post left foot surgery 09/04/2017   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   Accidentally caught in or between objects 08/04/2012    Conditions to be addressed/monitored:HTN, HLD, Anxiety, Depression, and chronic pain, failure to thrive, falls   Care Plan : RNCM: Development of Plan of care for Chronic Disease Management and Care Coordination Needs  Updates made by Vanita Ingles, RN since 02/04/2022 12:00 AM     Problem: RNCM: Development of plan of care for: Depression, Anxiety, Failure to thrive, HTN, HLD, Frequent falls   Priority: High     Long-Range Goal: RNCM: Effctive Management of plan of care for: Depression, Anxiety, Failure to thrive, HTN, HLD, Frequent falls, pain   Start Date: 02/04/2022  Expected End Date: 02/05/2023  Priority: High  Note:   Current Barriers:  Care Coordination needs related to Limited social support, Level of care concerns, ADL IADL limitations, Mental Health Concerns , Inability to perform ADL's independently, Inability to perform IADL's independently, and frequent falls  Lacks caregiver support.        Falls  RNCM Clinical Goal(s):  Patient will verbalize basic understanding of HTN, HLD, Anxiety, Depression, and Failure to Thrive, and frequent falls disease process and self health management plan as evidenced by meeting the patients needs, keeping appointments, calling the provider or team for questions, concerns, or new needs take all medications exactly as prescribed and will call provider for medication related questions as evidenced by taking medications as directed and calling the pharmacist for questions    work with social worker to address Limited social support, Level of care concerns, ADL IADL limitations, Mental Health Concerns , and Inability to perform IADL's independently related to the management of HTN, HLD, Anxiety, Depression, and failure to thrive and frequent falls as evidenced by review of EMR and patient or social worker report      demonstrate a decrease in HTN, HLD, Anxiety, Depression, and failure to thrive and frequent falls exacerbations as evidenced by stable VS, decreased falls, no acute exacerbations, and working with the CCM team to optimize the plan of care for effective management of the patients chronic conditions demonstrate ongoing self health care management ability of effective management of chronic conditions as evidenced by     through collaboration with RN Care manager, provider, and care team.   Interventions: 1:1 collaboration with primary care provider regarding development and update of comprehensive plan of care as evidenced by provider attestation and co-signature Inter-disciplinary care team collaboration (see longitudinal plan of care) Evaluation of current treatment plan related to  self management and patient's adherence to plan as established by provider   Falls:  (Status: New goal.) Long Term Goal  Provided written and verbal education re: potential causes of falls and Fall prevention strategies Reviewed medications and discussed potential side effects of medications such as dizziness and frequent urination Advised patient of importance of notifying provider of falls. 02-04-2022: The patient has had a fall since 01-22-2022 with telephone visit with the pcp. The patient states that he loses his balance a lot and has frequent falls. Education on notifying the provider of new falls or safety concerns Assessed for signs and symptoms of orthostatic hypotension Assessed for falls since last encounter. 02-04-2022: Has  had a new fall but could not remember the exact date. Has been since phone visit with the pcp on 01-22-2022 Assessed patients knowledge of fall risk prevention secondary to previously provided education Advised patient to discuss safety and fall concerns with provider Screening for signs and symptoms of depression related to chronic disease state Assessed social determinant of health  barriers  Anxiety, Depression, Failure to Thrive  (Status: New goal.) Long Term Goal  Evaluation of current treatment plan related to Anxiety, Depression, and failure to thrive , Limited social support, Level of care concerns, ADL IADL limitations, Inability to perform ADL's independently, Inability to perform IADL's independently, and frequent falls  self-management and patient's adherence to plan as established by provider. Discussed plans with patient for ongoing care management follow up and provided patient with direct contact information for care management team Advised patient to call the office for changes in his mood, anxiety, depression, or mental health; Provided education to patient re: working with the CCM team to effectively manage his chronic health needs. The patient feels if he had some help in the home this would help him a lot. He cannot empty his bedside commode and his girlfriend that lives with him is unable to do this also due to her health issues. Education and support. ; Reviewed medications with patient and discussed compliance. States he really only takes Ambien ; Collaborated with pcp and LCSW  regarding request for Requip for RLS and expressed needs for in home care and help. Inbasket message sent to the pcp and LCSW for assistance with expressed needs. Will follow up accordingly. ; Provided patient with emotional educational materials related to anxiety, depression, and failure to thrive; Social Work referral for assistance with disability and possible Medicaid filing ; Discussed plans with patient for ongoing care management follow up and provided patient with direct contact information for care management team; Advised patient to discuss changes in his mood, or mental health needs  with provider; Screening for signs and symptoms of depression related to chronic disease state;   Hyperlipidemia:  (Status: New goal.) Long Term Goal  Lab Results  Component Value Date    CHOL 150 01/02/2022   HDL 35 (L) 01/02/2022   LDLCALC 94 01/02/2022   TRIG 115 01/02/2022   CHOLHDL 4.3 01/02/2022     Medication review performed; medication list updated in electronic medical record.  Provider established cholesterol goals reviewed; Counseled on importance of regular laboratory monitoring as prescribed; Provided HLD educational materials; Reviewed role and benefits of statin for ASCVD risk reduction; Reviewed importance of limiting foods high in cholesterol;  Hypertension: (Status: New goal.) Long Term Goal  Last practice recorded BP readings:  BP Readings from Last 3 Encounters:  01/17/22 (!) 154/77  01/02/22 132/80  06/26/21 137/65  Most recent eGFR/CrCl:  Lab Results  Component Value Date   EGFR 74 01/02/2022    No components found for: "CRCL"  Evaluation of current treatment plan related to hypertension self management and patient's adherence to plan as established by provider;   Reviewed prescribed diet Heart Healthy diet  Discussed plans with patient for ongoing care management follow up and provided patient with direct contact information for care management team; Advised patient, providing education and rationale, to monitor blood pressure daily and record, calling PCP for findings outside established parameters;  Advised patient to discuss changes in heart health or questions with provider; Provided education on prescribed diet heart healthy diet ;  Discussed complications of poorly controlled blood pressure  such as heart disease, stroke, circulatory complications, vision complications, kidney impairment, sexual dysfunction;    Pain:  (Status: New goal.) Long Term Goal  Pain assessment performed. 02-04-2022: The patient states he has pain in his back and ribs. Rates his pain level at at 7 on a scale of 0-10 Medications reviewed Reviewed provider established plan for pain management; Discussed importance of adherence to all scheduled medical  appointments; Counseled on the importance of reporting any/all new or changed pain symptoms or management strategies to pain management provider; Advised patient to report to care team affect of pain on daily activities; Discussed use of relaxation techniques and/or diversional activities to assist with pain reduction (distraction, imagery, relaxation, massage, acupressure, TENS, heat, and cold application; Reviewed with patient prescribed pharmacological and nonpharmacological pain relief strategies; Advised patient to discuss changes in level or intensity of pain, or unresolved pain with provider;   Patient Goals/Self-Care Activities: Take medications as prescribed   Attend all scheduled provider appointments Call pharmacy for medication refills 3-7 days in advance of running out of medications Perform all self care activities independently  Call provider office for new concerns or questions  Work with the social worker to address care coordination needs and will continue to work with the clinical team to address health care and disease management related needs call the Suicide and Crisis Lifeline: 988 call the [place] National Suicide Prevention Lifeline: 7708245045 or TTY: (425)563-2722 TTY 8384230156) to talk to a trained counselor call 1-800-273-TALK (toll free, 24 hour hotline) if experiencing a Mental Health or [place] about high blood pressure call doctor for signs and symptoms of high blood pressure develop an action plan for high blood pressure keep all doctor appointments report new symptoms to your doctor eat more whole grains, fruits and vegetables, lean meats and healthy fats - call doctor when you experience any new symptoms - go to all doctor appointments as scheduled - adhere to prescribed diet: heart healthy diet       Plan:Telephone follow up appointment with care management team member scheduled for:  02-26-2022 at 10 am  [place],  MSN, [facility] Mobile: [phone]

## 2022-02-04 NOTE — Patient Instructions (Signed)
Visit Information  Thank you for taking time to visit with me today. Please don't hesitate to contact me if I can be of assistance to you before our next scheduled telephone appointment.  Following are the goals we discussed today:  - follow-up on any referrals for help I am given - think ahead to make sure my need does not become an emergency - make a list of family or friends that I can call  Our next appointment is by telephone on 02/12/22 at 11  Please call the care guide team at [phone] if you need to cancel or reschedule your appointment.   If you are experiencing a Mental Health or Behavioral Health Crisis or need someone to talk to, please call the Suicide and Crisis Lifeline: 31   Following is a copy of your full plan of care:  Care Plan : General Social Work (Adult)  Updates made by [employer], Clent Jacks, LCSW since 02/04/2022 12:00 AM     Problem: CH[facility] AMB "PATIENT-SPECIFIC PROBLEM"   Priority: High  Onset Date: 02/04/2022  Note:   CARE PLAN ENTRY (see longitudinal plan of care for additional care plan information)  Current Barriers:  Patient with Type 2 diabetes, Weakness of both lower extremities, Failure to thrive Protein-calorie malnutrition, moderate in need of assistance with connection to community resources  Knowledge deficits and need for support, education and care coordination related to community resources support  Limited social support, ADL IADL limitations, Limited access to caregiver, and Lacks knowledge of community resource: related to in home care support Patient discussed having limited in home support and needs assistance with his ADL's. Patient confirmed having a live in girlfriend along with friends however they are only able to assist on a limited basis  Clinical Goal(s)  Over the next 90 days, patient will follow up with [facility] and/or [facility] to discuss options and costs related to in home care Interventions provided by LCSW:   Assessed patient's care coordination needs related to in home care needs and discussed ongoing care management follow up  Provided patient with information about possible agencies that could provide in home care  Advised patient to call the agencies to discuss his needs, availability and costs Confirmed that patient receives partial Disability and workman's compensation Collaborated with appropriate clinical care team members regarding patient needs Solution-Focused Strategies employed:  Active listening / Reflection utilized  Emotional Support Provided   Patient Self Care Activities & Deficits:  Patient is unable to independently navigate community resource options without care coordination support  Acknowledges deficits and is motivated to resolve concern  Patient is able to contact in home care agencies provided as discussed today Unable to perform ADLs independently Unable to perform IADLs independently Calls provider office for new concerns or questions  Initial goal documentation       Mr. Dalziel was given information about Care Management services by the embedded care coordination team including:  Care Management services include personalized support from designated clinical staff supervised by his physician, including individualized plan of care and coordination with other care providers 24/7 contact phone numbers for assistance for urgent and routine care needs. The patient may stop CCM services at any time (effective at the end of the month) by phone call to the office staff.  Patient agreed to services and verbal consent obtained.   Patient verbalizes understanding of instructions and care plan provided today and agrees to view in MyChart. Active MyChart status and patient understanding of how  to access instructions and care plan via MyChart confirmed with patient.     Telephone follow up appointment with care management team member scheduled for: 02/12/22  Verna Czech, LCSW Clinical Social Worker  [facility] Care Management [phone]

## 2022-02-06 ENCOUNTER — Ambulatory Visit: Payer: Self-pay | Admitting: *Deleted

## 2022-02-06 ENCOUNTER — Encounter: Payer: Self-pay | Admitting: *Deleted

## 2022-02-06 ENCOUNTER — Telehealth: Payer: Self-pay | Admitting: Family Medicine

## 2022-02-06 DIAGNOSIS — I10 Essential (primary) hypertension: Secondary | ICD-10-CM

## 2022-02-06 DIAGNOSIS — R29898 Other symptoms and signs involving the musculoskeletal system: Secondary | ICD-10-CM

## 2022-02-06 DIAGNOSIS — M6281 Muscle weakness (generalized): Secondary | ICD-10-CM

## 2022-02-06 DIAGNOSIS — R627 Adult failure to thrive: Secondary | ICD-10-CM

## 2022-02-06 NOTE — Telephone Encounter (Signed)
Pt is calling in for assistance. Pt says that he has a dilemma with home health services and need to speak with Pam because she assist him with those services.    [phone] -

## 2022-02-06 NOTE — Chronic Care Management (AMB) (Signed)
Chronic Care Management    Clinical Social Work Note  02/06/2022 Name: Alex Green MRN: [id number] DOB: [birth year]  Alex Green is a 56 y.o. year old male who is a primary care patient of Alex Cords, DO. The CCM team was consulted to assist the patient with chronic disease management and/or care coordination needs related to: [employer] .   Engaged with patient by telephone for follow up visit in response to provider referral for social work chronic care management and care coordination services.   Consent to Services:  The patient was given information about Chronic Care Management services, agreed to services, and gave verbal consent prior to initiation of services.  Please see initial visit note for detailed documentation.   Patient agreed to services and consent obtained.   Assessment: Review of patient past medical history, allergies, medications, and health status, including review of relevant consultants reports was performed today as part of a comprehensive evaluation and provision of chronic care management and care coordination services.     SDOH (Social Determinants of Health) assessments and interventions performed:    Advanced Directives Status: Not addressed in this encounter.  CCM Care Plan  Allergies  Allergen Reactions   Other     "Bioxin gives me a yeast infection"    Outpatient Encounter Medications as of 02/06/2022  Medication Sig   amoxicillin (AMOXIL) 500 MG capsule Take 1 capsule (500 mg total) by mouth 3 (three) times daily.   clotrimazole-betamethasone (LOTRISONE) cream Apply 2 times a day for worsening flare dry skin dermatitis of toes/feet, may re-use daily up to 1 week as needed.   fluticasone (FLONASE) 50 MCG/ACT nasal spray PLACE 2 SPRAYS INTO BOTH NOSTRILS DAILY FOR 4-6 WEEKS THEN STOP AND USE SEASONALLY OR AS NEEDED.   levETIRAcetam (KEPPRA) 500 MG tablet Take 1 tablet (500 mg total) by mouth 2 (two) times daily for 7  days.   levETIRAcetam (KEPPRA) 500 MG tablet levetiracetam 500 mg tablet  TAKE 1 TABLET BY MOUTH TWICE A DAY FOR 7 DAYS (Patient not taking: Reported on 10/05/2021)   ondansetron (ZOFRAN-ODT) 4 MG disintegrating tablet Take 1 tablet (4 mg total) by mouth every 8 (eight) hours as needed for nausea or vomiting.   rOPINIRole (REQUIP) 0.5 MG tablet Take 1 tablet (0.5 mg total) by mouth at bedtime. May increase to 2 pills at bedtime for dose 1mg  nightly if needed.   zolpidem (AMBIEN CR) 12.5 MG CR tablet Take 1 tablet (12.5 mg total) by mouth at bedtime as needed for sleep.   No facility-administered encounter medications on file as of 02/06/2022.    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) 06/24/2020   Controlled type 2 diabetes mellitus with neuropathy (HCC) 04/21/2020   Essential hypertension 04/21/2020   GAD (generalized anxiety disorder) 04/21/2020   GERD (gastroesophageal reflux disease) 04/21/2020   Tobacco abuse 04/21/2020   Alcohol dependence in early full remission (HCC) 04/21/2020   Status post left foot surgery 09/04/2017   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   Accidentally caught in or  between objects 08/04/2012    Conditions to be addressed/monitored: Type 2 diabetes, Weakness of both lower extremities, Failure to thrive Protein-calorie malnutrition, moderate  ; Limited social support and ADL IADL limitations  Care Plan : General Social Work (Adult)  Updates made by [employer], Clent Jacks, LCSW since  02/06/2022 12:00 AM     Problem: CHL AMB "PATIENT-SPECIFIC PROBLEM"   Priority: High  Onset Date: 02/04/2022  Note:   CARE PLAN ENTRY (see longitudinal plan of care for additional care plan information)  Current Barriers:  Patient with Type 2 diabetes, Weakness of both lower extremities, Failure to thrive Protein-calorie malnutrition, moderate in need of assistance with connection to community resources  Knowledge deficits and need for support, education and care coordination related to community resources support  Limited social support, ADL IADL limitations, Limited access to caregiver, and Lacks knowledge of community resource: related to in home care support Patient discussed having limited in home support and needs assistance with his ADL's. Patient confirmed having a live in girlfriend along with friends however they are only able to assist on a limited basis  Clinical Goal(s)  Over the next 90 days, patient will follow up with [facility] and/or [facility] to discuss options and costs related to in home care Interventions provided by LCSW:  Assessed patient's care coordination needs related to in home care needs and discussed ongoing care management follow up  Phone call to patient to follow up on referrals provided for in home aid care Patient confirmed that he has contacted [facility], they came out to assess however were unable to provide services due to an infestation of roaches and mice Phone call to [facility], it was confirmed that patient's home would need to be treated for the roaches and mice before they could provide care Solution-Focused Strategies employed:  Active listening / Reflection utilized  Emotional Support Provided  Referral made to the Careguide for any additional resources to assist with extermination costs  Patient Self Care Activities & Deficits:  Patient is unable to independently navigate community resource options without care  coordination support  Acknowledges deficits and is motivated to resolve concern  Patient is able to contact in home care agencies provided as discussed today Unable to perform ADLs independently Unable to perform IADLs independently Calls provider office for new concerns or questions  Please see past updates related to this goal by clicking on the "Past Updates" button in the selected goal         Follow Up Plan: SW will follow up with patient by phone over the next 7-14 business days      [place], [place] [facility] [phone]

## 2022-02-06 NOTE — Progress Notes (Signed)
This encounter was created in error - please disregard.

## 2022-02-06 NOTE — Patient Instructions (Addendum)
Visit Information  Thank you for taking time to visit with me today. Please don't hesitate to contact me if I can be of assistance to you before our next scheduled telephone appointment.  Following are the goals we discussed today:  - follow-up on any referrals for help I am given - think ahead to make sure my need does not become an emergency - make a list of family or friends that I can call  Our next appointment is by telephone on 02/12/22 at 11am  Please call the care guide team at [phone] if you need to cancel or reschedule your appointment.   If you are experiencing a Mental Health or Behavioral Health Crisis or need someone to talk to, please call the Suicide and Crisis Lifeline: 988   Patient verbalizes understanding of instructions and care plan provided today and agrees to view in MyChart. Active MyChart status and patient understanding of how to access instructions and care plan via MyChart confirmed with patient.     Telephone follow up appointment with care management team member scheduled for: 02/12/22  Verna Czech, LCSW Clinical Social Worker  [facility] [phone]

## 2022-02-07 ENCOUNTER — Telehealth: Payer: Self-pay | Admitting: *Deleted

## 2022-02-07 NOTE — Telephone Encounter (Signed)
   Telephone encounter was:  Successful.  02/07/2022 Name: KEIRON IODICE MRN: [id number] DOB: [birth year]  [name] is a 56 y.o. year old male who is a primary care patient of [name], DO . The community resource team was consulted for assistance with Financial Difficulties related to getting an exterminator   Care guide performed the following needing interventions: Patient provided with information about care guide support team and interviewed to confirm resource needs. Patient is not able to empty his own commode or remove the roach laden clothes in the residence where him and his girl friend live the agency has turned down service as his situation is very dirty , Will mail lists of food banks and exterminators patient unlikelt to follow up Follow Up Plan:  No further follow up planned at this time. The patient has been provided with needed resources.  Yehuda Mao Greenauer -[facility] [place], Population Health [phone] [address] [place] , [place] [address] Email : [email or web address]

## 2022-02-12 ENCOUNTER — Ambulatory Visit: Payer: Medicare Other | Admitting: Licensed Clinical Social Worker

## 2022-02-12 DIAGNOSIS — E1169 Type 2 diabetes mellitus with other specified complication: Secondary | ICD-10-CM

## 2022-02-12 DIAGNOSIS — F331 Major depressive disorder, recurrent, moderate: Secondary | ICD-10-CM

## 2022-02-12 DIAGNOSIS — F411 Generalized anxiety disorder: Secondary | ICD-10-CM

## 2022-02-14 DIAGNOSIS — I10 Essential (primary) hypertension: Secondary | ICD-10-CM | POA: Diagnosis not present

## 2022-02-14 DIAGNOSIS — F32A Depression, unspecified: Secondary | ICD-10-CM | POA: Diagnosis not present

## 2022-02-14 DIAGNOSIS — E785 Hyperlipidemia, unspecified: Secondary | ICD-10-CM

## 2022-02-15 NOTE — Chronic Care Management (AMB) (Signed)
Chronic Care Management    Clinical Social Work Note  02/15/2022 Name: Alex Green MRN: [id number] DOB: [birth year]  Alex Green is a 56 y.o. year old male who is a primary care patient of [name], DO. The CCM team was consulted to assist the patient with chronic disease management and/or care coordination needs related to: [employer]  and Level of Care Concerns.   Engaged with patient by telephone for follow up visit in response to provider referral for social work chronic care management and care coordination services.   Consent to Services:  The patient was given information about Chronic Care Management services, agreed to services, and gave verbal consent prior to initiation of services.  Please see initial visit note for detailed documentation.   Patient agreed to services and consent obtained.   Assessment: Review of patient past medical history, allergies, medications, and health status, including review of relevant consultants reports was performed today as part of a comprehensive evaluation and provision of chronic care management and care coordination services.     SDOH (Social Determinants of Health) assessments and interventions performed:    Advanced Directives Status: Not addressed in this encounter.  CCM Care Plan  Allergies  Allergen Reactions   Other     "Bioxin gives me a yeast infection"    Outpatient Encounter Medications as of 02/12/2022  Medication Sig   amoxicillin (AMOXIL) 500 MG capsule Take 1 capsule (500 mg total) by mouth 3 (three) times daily.   clotrimazole-betamethasone (LOTRISONE) cream Apply 2 times a day for worsening flare dry skin dermatitis of toes/feet, may re-use daily up to 1 week as needed.   fluticasone (FLONASE) 50 MCG/ACT nasal spray PLACE 2 SPRAYS INTO BOTH NOSTRILS DAILY FOR 4-6 WEEKS THEN STOP AND USE SEASONALLY OR AS NEEDED.   levETIRAcetam (KEPPRA) 500 MG tablet Take 1 tablet (500 mg total) by mouth 2  (two) times daily for 7 days.   levETIRAcetam (KEPPRA) 500 MG tablet levetiracetam 500 mg tablet  TAKE 1 TABLET BY MOUTH TWICE A DAY FOR 7 DAYS (Patient not taking: Reported on 10/05/2021)   ondansetron (ZOFRAN-ODT) 4 MG disintegrating tablet Take 1 tablet (4 mg total) by mouth every 8 (eight) hours as needed for nausea or vomiting.   rOPINIRole (REQUIP) 0.5 MG tablet Take 1 tablet (0.5 mg total) by mouth at bedtime. May increase to 2 pills at bedtime for dose 1mg  nightly if needed.   zolpidem (AMBIEN CR) 12.5 MG CR tablet Take 1 tablet (12.5 mg total) by mouth at bedtime as needed for sleep.   No facility-administered encounter medications on file as of 02/12/2022.    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) 06/24/2020   Controlled type 2 diabetes mellitus with neuropathy (HCC) 04/21/2020   Essential hypertension 04/21/2020   GAD (generalized anxiety disorder) 04/21/2020   GERD (gastroesophageal reflux disease) 04/21/2020   Tobacco abuse 04/21/2020   Alcohol dependence in early full remission (HCC) 04/21/2020   Status post left foot surgery 09/04/2017   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  Accidentally caught in or between objects 08/04/2012    Conditions to be addressed/monitored: HTN, DMII, Anxiety, and Depression; Limited social support, Transportation, Housing barriers, ADL IADL limitations, and Mental Health Concerns   Care Plan : General Social Work (Adult)  Updates made by Jenel Lucks D, LCSW since  02/15/2022 12:00 AM     Problem: CHL AMB "PATIENT-SPECIFIC PROBLEM"   Priority: High  Onset Date: 02/04/2022  Note:   CARE PLAN ENTRY (see longitudinal plan of care for additional care plan information)  Current Barriers:  Patient with Type 2 diabetes, Weakness of both lower extremities, Failure to thrive Protein-calorie malnutrition, moderate in need of assistance with connection to community resources  Knowledge deficits and need for support, education and care coordination related to community resources support  Limited social support, ADL IADL limitations, Limited access to caregiver, and Lacks knowledge of community resource: related to in home care support Patient discussed having limited in home support and needs assistance with his ADL's. Patient confirmed having a live in girlfriend along with friends however they are only able to assist on a limited basis  Clinical Goal(s)  Over the next 90 days, patient will follow up with [facility] and/or [facility] to discuss options and costs related to in home care Interventions provided by LCSW:  Assessed patient's care coordination needs related to in home care needs and discussed ongoing care management follow up  Phone call to patient to follow up on referrals provided for in home aid care Patient would like to obtain prescription for a [employer] to assist with mobility. States he hasn't received diabetic shoes Patient endorses limited support with emptying bedside commode noting life partner has limitations due to her chronic health conditions  LCSW discussed process for applying for MA. Patient agreed to f/up with Workman's Comp noting it will end Sept 2023 Patient denies SI/HI Encouraged pt to contact LE if he has any safety concerns Solution-Focused Strategies employed:  Active listening / Reflection utilized  Emotional Support Provided   Patient Self Care Activities & Deficits:  Patient is unable to  independently navigate community resource options without care coordination support  Acknowledges deficits and is motivated to resolve concern  Patient is able to contact in home care agencies provided as discussed today Unable to perform ADLs independently Unable to perform IADLs independently Calls provider office for new concerns or questions  Please see past updates related to this goal by clicking on the "Past Updates" button in the selected goal         Follow Up Plan: SW will follow up with patient by phone over the next 4 weeks      Jenel Lucks, MSW, LCSW [facility] Care Management [place]  [facility] [email or web address] Phone [phone] 9:31 AM

## 2022-02-15 NOTE — Patient Instructions (Signed)
Visit Information  Thank you for taking time to visit with me today. Please don't hesitate to contact me if I can be of assistance to you before our next scheduled telephone appointment.  Following are the goals we discussed today:  - follow-up on any referrals for help I am given - think ahead to make sure my need does not become an emergency - make a list of family or friends that I can call  Our next appointment is by telephone on 02/26/22 at 10 AM  Please call the care guide team at [phone] if you need to cancel or reschedule your appointment.   If you are experiencing a Mental Health or Behavioral Health Crisis or need someone to talk to, please call the Suicide and Crisis Lifeline: 988 call 911   The patient verbalized understanding of instructions, educational materials, and care plan provided today and DECLINED offer to receive copy of patient instructions, educational materials, and care plan.   Jenel Lucks, MSW, LCSW [facility] Care Management [place]  [facility] [email or web address] Phone [phone] 9:32 AM

## 2022-02-21 ENCOUNTER — Ambulatory Visit (INDEPENDENT_AMBULATORY_CARE_PROVIDER_SITE_OTHER): Payer: Medicare Other | Admitting: *Deleted

## 2022-02-21 DIAGNOSIS — R627 Adult failure to thrive: Secondary | ICD-10-CM

## 2022-02-21 DIAGNOSIS — E1169 Type 2 diabetes mellitus with other specified complication: Secondary | ICD-10-CM

## 2022-02-21 DIAGNOSIS — I10 Essential (primary) hypertension: Secondary | ICD-10-CM

## 2022-02-21 NOTE — Chronic Care Management (AMB) (Addendum)
Chronic Care Management   CCM RN Visit Note  02/21/2022 Name: Alex Green MRN: [id number] DOB: 24-Feb-1966  Subjective: Alex Green is a 56 y.o. year old male who is a primary care patient of Olin Hauser, DO. The care management team was consulted for assistance with disease management and care coordination needs.    Engaged with patient by telephone for follow up visit in response to provider referral for case management and/or care coordination services.   Consent to Services:  The patient was given information about Chronic Care Management services, agreed to services, and gave verbal consent prior to initiation of services.  Please see initial visit note for detailed documentation.   Patient agreed to services and verbal consent obtained.   Assessment: Review of patient past medical history, allergies, medications, health status, including review of consultants reports, laboratory and other test data, was performed as part of comprehensive evaluation and provision of chronic care management services.   SDOH (Social Determinants of Health) assessments and interventions performed:  SDOH Interventions    Flowsheet Row Chronic Care Management from 02/04/2022 in [place] from 10/05/2021 in [facility] Office Visit from 06/26/2021 in [facility] Office Visit from 02/14/2021 in [facility] Office Visit from 07/31/2020 in [facility] Office Visit from 04/21/2020 in [place]  SDOH Interventions        Food Insecurity Interventions Intervention Not Indicated Intervention Not Indicated -- -- -- --  Housing Interventions -- Intervention Not Indicated -- -- -- --  Transportation Interventions -- Intervention Not Indicated -- -- -- --  Depression Interventions/Treatment  -- Medication, Currently on Treatment Currently on Treatment, Counseling Counseling, Currently on  Treatment Currently on Treatment, Counseling Currently on Treatment, Counseling  Financial Strain Interventions -- Intervention Not Indicated -- -- -- --  Physical Activity Interventions -- Patient Refused -- -- -- --  Stress Interventions -- Intervention Not Indicated -- -- -- --  Social Connections Interventions Intervention Not Indicated Intervention Not Indicated -- -- -- --        CCM Care Plan  Allergies  Allergen Reactions   Other     "Bioxin gives me a yeast infection"    Outpatient Encounter Medications as of 02/21/2022  Medication Sig   clotrimazole-betamethasone (LOTRISONE) cream Apply 2 times a day for worsening flare dry skin dermatitis of toes/feet, may re-use daily up to 1 week as needed.   fluticasone (FLONASE) 50 MCG/ACT nasal spray PLACE 2 SPRAYS INTO BOTH NOSTRILS DAILY FOR 4-6 WEEKS THEN STOP AND USE SEASONALLY OR AS NEEDED.   ondansetron (ZOFRAN-ODT) 4 MG disintegrating tablet Take 1 tablet (4 mg total) by mouth every 8 (eight) hours as needed for nausea or vomiting.   rOPINIRole (REQUIP) 0.5 MG tablet Take 1 tablet (0.5 mg total) by mouth at bedtime. May increase to 2 pills at bedtime for dose 16m nightly if needed.   zolpidem (AMBIEN CR) 12.5 MG CR tablet Take 1 tablet (12.5 mg total) by mouth at bedtime as needed for sleep.   amoxicillin (AMOXIL) 500 MG capsule Take 1 capsule (500 mg total) by mouth 3 (three) times daily. (Patient not taking: Reported on 02/21/2022)   levETIRAcetam (KEPPRA) 500 MG tablet Take 1 tablet (500 mg total) by mouth 2 (two) times daily for 7 days.   levETIRAcetam (KEPPRA) 500 MG tablet levetiracetam 500 mg tablet  TAKE 1 TABLET BY MOUTH TWICE A DAY FOR 7 DAYS (  Patient not taking: Reported on 10/05/2021)   No facility-administered encounter medications on file as of 02/21/2022.    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[place]) 06/24/2020   Controlled type 2 diabetes mellitus with neuropathy ([place]) 04/21/2020   Essential hypertension 04/21/2020   GAD (generalized anxiety disorder) 04/21/2020   GERD (gastroesophageal reflux disease) 04/21/2020   Tobacco abuse 04/21/2020   Alcohol dependence in early full remission ([place]) 04/21/2020   Status post left foot surgery 09/04/2017   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   Accidentally caught in or between objects 08/04/2012    Conditions to be addressed/monitored:HTN, HLD, and Falls  Care Plan : RNCM: Development of Plan of care for Chronic Disease Management and Care Coordination Needs  Updates made by Kassie Mends, RN since 02/21/2022 12:00 AM     Problem: RNCM: Development of plan of care for: Depression, Anxiety, Failure to thrive, HTN, HLD, Frequent falls   Priority: High     Long-Range Goal: RNCM: Effctive Management of plan of care for: Depression, Anxiety, Failure to thrive, HTN, HLD, Frequent falls, pain   Start Date: 02/04/2022  Expected End Date: 02/05/2023  Priority: High  Note:   Current Barriers:  Care Coordination needs related to Limited social support, Level of care concerns, ADL IADL limitations, Mental Health Concerns , Inability to perform ADL's independently, Inability to perform IADL's independently, and frequent falls  Lacks caregiver support.        Falls  RNCM Clinical Goal(s):  Patient will verbalize basic understanding of HTN, HLD, Anxiety, Depression, and Failure to Thrive, and frequent falls disease process and self health management plan as evidenced by meeting the patients  needs, keeping appointments, calling the provider or team for questions, concerns, or new needs take all medications exactly as prescribed and will call provider for medication related questions as evidenced by taking medications as directed and calling the pharmacist for questions    work with social worker to address Limited social support, Level of care concerns, ADL IADL limitations, Mental Health Concerns , and Inability to perform IADL's independently related to the management of HTN, HLD, Anxiety, Depression, and failure to thrive and frequent falls as evidenced by review of EMR and patient or social worker report     demonstrate a decrease in HTN, HLD, Anxiety, Depression, and failure to thrive and frequent falls exacerbations as evidenced by stable VS, decreased falls, no acute exacerbations, and working with the CCM team to optimize the plan of care for effective management of the patients chronic conditions demonstrate ongoing self health care management ability of effective management of chronic conditions as evidenced by     through collaboration with RN Care manager, provider, and care team.  Patient reports he checked CBG BID yesterday with readaings 110,115, reports he has Alpha-gal and this limits what he can eat.  Interventions: 1:1 collaboration with primary care provider regarding development and update of comprehensive plan of care as evidenced by provider attestation and co-signature Inter-disciplinary care team collaboration (see longitudinal plan of care) Evaluation of current treatment  plan related to  self management and patient's adherence to plan as established by provider   Falls:  (Status: New goal.) Long Term Goal  Provided written and verbal education re: potential causes of falls and Fall prevention strategies Reviewed medications and reinforced potential side effects of medications such as dizziness and frequent urination Advised patient of importance of notifying  provider of falls.  The patient states that he loses his balance a lot and has frequent falls.  Reinforced on notifying the provider of new falls or safety concerns Assessed for signs and symptoms of orthostatic hypotension Assessed patients knowledge of fall risk prevention secondary to previously provided education Advised patient to discuss safety and fall concerns with provider  Anxiety, Depression, Failure to Thrive  (Status: New goal.) Long Term Goal  Evaluation of current treatment plan related to Anxiety, Depression, and failure to thrive , Limited social support, Level of care concerns, ADL IADL limitations, Inability to perform ADL's independently, Inability to perform IADL's independently, and frequent falls  self-management and patient's adherence to plan as established by provider. Discussed plans with patient for ongoing care management follow up and provided patient with direct contact information for care management team Advised patient to call the office for changes in his mood, anxiety, depression, or mental health; Provided education to patient re: working with the CCM team to effectively manage his chronic health needs. The patient feels if he had some help in the home this would help him a lot. He cannot empty his bedside commode and his girlfriend that lives with him is unable to do this also due to her health issues. ; Reviewed medications with patient and discussed compliance. States he really only takes Ambien ; Verified with patient that he is taking requip for restless leg ; Offered support related to anxiety, depression Social Work currently working with patient, reviewed importance of continuing to work with Education officer, museum; Discussed plans with patient for ongoing care management follow up and provided patient with direct contact information for care management team; Reinforced patient to discuss changes in his mood, or mental health needs  with provider; Reviewed  importance of avoiding red meat (pork, beef, venison) with Alpha-gal Reviewed importance of having adequate protein at each meal  Hyperlipidemia:  (Status: New goal.) Long Term Goal  Lab Results  Component Value Date   CHOL 150 01/02/2022   HDL 35 (L) 01/02/2022   LDLCALC 94 01/02/2022   TRIG 115 01/02/2022   CHOLHDL 4.3 01/02/2022     Medication review performed; medication list updated in electronic medical record.  Provider established cholesterol goals reviewed; Counseled on importance of regular laboratory monitoring as prescribed; Reviewed importance of limiting foods high in cholesterol; Reviewed importance of following heart healthy diet  Hypertension: (Status: New goal.) Long Term Goal  Last practice recorded BP readings:  BP Readings from Last 3 Encounters:  01/17/22 (!) 154/77  01/02/22 132/80  06/26/21 137/65  Most recent eGFR/CrCl:  Lab Results  Component Value Date   EGFR 74 01/02/2022    No components found for: "CRCL"  Evaluation of current treatment plan related to hypertension self management and patient's adherence to plan as established by provider;   Reviewed prescribed diet Heart Healthy diet  Advised patient, providing education and rationale, to monitor blood pressure daily and record, calling PCP for findings outside established parameters;  Discussed complications of poorly controlled blood pressure such as heart disease, stroke, circulatory complications, vision complications, kidney impairment, sexual dysfunction;    Pain:  (  Status: New goal.) Long Term Goal  Pain assessment completed Medications reviewed Reviewed provider established plan for pain management; Discussed importance of adherence to all scheduled medical appointments; Counseled on the importance of reporting any/all new or changed pain symptoms or management strategies to pain management provider; Advised patient to report to care team affect of pain on daily activities; Reinforced  use of relaxation techniques and/or diversional activities to assist with pain reduction (distraction, imagery, relaxation, massage, acupressure, TENS, heat, and cold application; Reinforced with patient prescribed pharmacological and nonpharmacological pain relief strategies; Advised patient to discuss changes in level or intensity of pain, or unresolved pain with provider;   Patient Goals/Self-Care Activities: Take medications as prescribed   Attend all scheduled provider appointments Call pharmacy for medication refills 3-7 days in advance of running out of medications Perform all self care activities independently  Call provider office for new concerns or questions  Work with the social worker to address care coordination needs and will continue to work with the clinical team to address health care and disease management related needs call the Suicide and Crisis Lifeline: 988 call the [place] National Suicide Prevention Lifeline: (608)351-9543 or TTY: 918-460-8479 TTY (443)398-9576) to talk to a trained counselor call 1-800-273-TALK (toll free, 24 hour hotline) if experiencing a Mental Health or [place]  call doctor for signs and symptoms of high blood pressure keep all doctor appointments report new symptoms to your doctor eat more whole grains, fruits and vegetables, lean meats and healthy fats - call doctor when you experience any new symptoms - go to all doctor appointments as scheduled - adhere to prescribed diet: heart healthy diet Avoid red meat (pork, beef, venison) with Alpha-gal Include some protein with each meal Continue working with Education officer, museum for assistance with applying for medicaid and assistance in the home Continue to check blood sugar as ordered by your doctor fall prevention strategies: change position slowly, use assistive device such as walker or cane (per provider recommendations) when walking, keep walkways clear, have good lighting in room. It  is important to contact your provider if you have any falls, maintain muscle strength/tone by exercise per provider recommendations.       Plan:Telephone follow up appointment with care management team member scheduled for:  04/04/22 at 1 pm  Jacqlyn Larsen [facility], BSN RN Case Manager [facility] [phone]

## 2022-02-21 NOTE — Patient Instructions (Signed)
Visit Information  Thank you for taking time to visit with me today. Please don't hesitate to contact me if I can be of assistance to you before our next scheduled telephone appointment.  Following are the goals we discussed today:  Take medications as prescribed   Attend all scheduled provider appointments Call pharmacy for medication refills 3-7 days in advance of running out of medications Perform all self care activities independently  Call provider office for new concerns or questions  Work with the social worker to address care coordination needs and will continue to work with the clinical team to address health care and disease management related needs call the Suicide and Crisis Lifeline: 988 call the [place] National Suicide Prevention Lifeline: 854-034-9718 or TTY: 901-119-4740 TTY 702 674 9430) to talk to a trained counselor call 1-800-273-TALK (toll free, 24 hour hotline) if experiencing a Mental Health or Behavioral Health Crisis  call doctor for signs and symptoms of high blood pressure keep all doctor appointments report new symptoms to your doctor eat more whole grains, fruits and vegetables, lean meats and healthy fats - call doctor when you experience any new symptoms - go to all doctor appointments as scheduled - adhere to prescribed diet: heart healthy diet Avoid red meat (pork, beef, venison) with Alpha-gal Include some protein with each meal Continue working with Child psychotherapist for assistance with applying for medicaid and assistance in the home Continue to check blood sugar as ordered by your doctor fall prevention strategies: change position slowly, use assistive device such as walker or cane (per provider recommendations) when walking, keep walkways clear, have good lighting in room. It is important to contact your provider if you have any falls, maintain muscle strength/tone by exercise per provider recommendations.  Our next appointment is by telephone on 04/04/22  at 1pm  Please call the care guide team at [phone] if you need to cancel or reschedule your appointment.   If you are experiencing a Mental Health or Behavioral Health Crisis or need someone to talk to, please call the Suicide and Crisis Lifeline: 988 call the [place] National Suicide Prevention Lifeline: 985 092 4512 or TTY: (636)123-7930 TTY (530) 772-0024) to talk to a trained counselor call 1-800-273-TALK (toll free, 24 hour hotline) call 911   The patient verbalized understanding of instructions, educational materials, and care plan provided today and DECLINED offer to receive copy of patient instructions, educational materials, and care plan.    Irving Shows RNC, BSN RN Case Manager [facility] [phone]

## 2022-02-26 ENCOUNTER — Telehealth: Payer: Medicare Other

## 2022-02-26 ENCOUNTER — Ambulatory Visit: Payer: Self-pay | Admitting: *Deleted

## 2022-02-26 NOTE — Patient Instructions (Signed)
Visit Information  Thank you for taking time to visit with me today. Please don't hesitate to contact me if I can be of assistance to you.   Following are the goals we discussed today:   Goals Addressed             This Visit's Progress    In home care resources       Care Coordination Interventions: Patient with Type 2 diabetes, Weakness of both lower extremities, Failure to thrive Protein-calorie malnutrition, limited social support, ADL IADL limitations Patient confirms limited support related to his care needs, uses a bedside commode but has no one assist with emptying Needs overall assistance with ADL's-patient's girlfriend not physically able to assist In home care resources provided to patient previously, [facility] came to the home to assess, however due to roach and mice infestation was not able to provide in home care Referral completed to the [facility] , resources provided for exterminators and food banks-patient states that he uses glue traps and the mice are just about gone but the roaches remain Patient confirms that he has paid out of pocket for a Holiday representative wheelchair, getting used to using this Confirms that he has a friend that is helping him to get the home "back in order" but has not hired a Community education officer due to multiple items and clutter in the home Per patient, he had a roommate that left a lot of their belongings and has not come to get the items yes, coordinating a date for pick strongly suggested This Child psychotherapist encouraged patient to utilize resources provided by careguide to continue to address the infestation so that additional help in the home can be coordinated to assist with ADL's. Active listening / Reflection utilized  Emotional Support Provided Patient encouraged to continue clean up efforts to allow additional supports the home Patient agreeable to reaching out to previous roommate to get their things, however reluctant             Please call the care guide team at [phone] if you need to cancel or reschedule your appointment.   If you are experiencing a Mental Health or Behavioral Health Crisis or need someone to talk to, please call the Suicide and Crisis Lifeline: 988 call 911   Patient verbalizes understanding of instructions and care plan provided today and agrees to view in MyChart. Active MyChart status and patient understanding of how to access instructions and care plan via MyChart confirmed with patient.     No further follow up required: Patient to utilize extermination and food resources provided to patient Patient has follow up scheduled with CCM RN on 04/04/22   Verna Czech, LCSW Clinical Social Worker  [facility] Care Management [phone]

## 2022-02-26 NOTE — Patient Outreach (Signed)
  Care Coordination   Initial Visit Note   02/26/2022 Name: JENNIFER HOLLAND MRN: [id number] DOB: [birth year]  [name] is a 56 y.o. year old male who sees [name], DO for primary care. I spoke with  [name] by phone today.  What matters to the patients health and wellness today?  In home care resources    Goals Addressed             This Visit's Progress    In home care resources       Care Coordination Interventions: Patient with Type 2 diabetes, Weakness of both lower extremities, Failure to thrive Protein-calorie malnutrition, limited social support, ADL IADL limitations Patient confirms limited support related to his care needs, uses a bedside commode but has no one assist with emptying Needs overall assistance with ADL's-patient's girlfriend not physically able to assist In home care resources provided to patient previously, [facility] came to the home to assess, however due to roach and mice infestation was not able to provide in home care Referral completed to the [facility] , resources provided for exterminators and food banks-patient states that he uses glue traps and the mice are just about gone but the roaches remain Patient confirms that he has paid out of pocket for a Holiday representative wheelchair, getting used to using this Confirms that he has a friend that is helping him to get the home "back in order" but has not hired a Community education officer due to multiple items and clutter in the home Per patient, he had a roommate that left a lot of their belongings and has not come to get the items yes, coordinating a date for pick strongly suggested This Child psychotherapist encouraged patient to utilize resources provided by careguide to continue to address the infestation so that additional help in the home can be coordinated to assist with ADL's. Active listening / Reflection utilized  Emotional Support Provided Patient encouraged to continue clean up  efforts to allow additional supports the home Patient agreeable to reaching out to previous roommate to get their things, however reluctant           SDOH assessments and interventions completed:  Yes  SDOH Interventions Today    Flowsheet Row Most Recent Value  SDOH Interventions   Food Insecurity Interventions Intervention Not Indicated  Housing Interventions Ambulatory [id number] Order  Transportation Interventions Intervention Not Indicated        Care Coordination Interventions Activated:  Yes  Care Coordination Interventions:  Yes, provided   Follow up plan: No further intervention required.   Encounter Outcome:  Pt. Visit Completed

## 2022-03-16 DIAGNOSIS — F32A Depression, unspecified: Secondary | ICD-10-CM

## 2022-03-16 DIAGNOSIS — E785 Hyperlipidemia, unspecified: Secondary | ICD-10-CM

## 2022-03-16 DIAGNOSIS — F17210 Nicotine dependence, cigarettes, uncomplicated: Secondary | ICD-10-CM

## 2022-03-16 DIAGNOSIS — I10 Essential (primary) hypertension: Secondary | ICD-10-CM | POA: Diagnosis not present

## 2022-03-19 ENCOUNTER — Telehealth: Payer: Self-pay | Admitting: Family Medicine

## 2022-03-19 NOTE — Telephone Encounter (Unsigned)
Pt called to speak with Tye Maryland about the diabetic shoes he was fitted for and the medical necessity form / he said he has been dealing with the issue since March and would like something taken care of asap / please advise

## 2022-03-20 NOTE — Telephone Encounter (Signed)
Left a message asking for additional information. We do not have a Cathy here at [facility].

## 2022-03-27 ENCOUNTER — Ambulatory Visit: Payer: Medicare Other | Admitting: Family Medicine

## 2022-03-27 ENCOUNTER — Other Ambulatory Visit: Payer: Self-pay | Admitting: Family Medicine

## 2022-03-27 ENCOUNTER — Encounter: Payer: Self-pay | Admitting: Family Medicine

## 2022-03-27 VITALS — BP 165/95 | HR 102 | Ht 68.0 in | Wt 138.0 lb

## 2022-03-27 DIAGNOSIS — E1142 Type 2 diabetes mellitus with diabetic polyneuropathy: Secondary | ICD-10-CM | POA: Diagnosis not present

## 2022-03-27 DIAGNOSIS — F411 Generalized anxiety disorder: Secondary | ICD-10-CM

## 2022-03-27 DIAGNOSIS — R2689 Other abnormalities of gait and mobility: Secondary | ICD-10-CM

## 2022-03-27 DIAGNOSIS — R296 Repeated falls: Secondary | ICD-10-CM

## 2022-03-27 DIAGNOSIS — R29898 Other symptoms and signs involving the musculoskeletal system: Secondary | ICD-10-CM | POA: Diagnosis not present

## 2022-03-27 MED ORDER — ZOLPIDEM TARTRATE ER 12.5 MG PO TBCR: 12.5000 mg | EXTENDED_RELEASE_TABLET | Freq: Every evening | ORAL | 1 refills | Status: DC | PRN

## 2022-03-27 NOTE — Progress Notes (Signed)
Subjective:    Patient ID: Alex Green, male    DOB: 1965/09/25, 56 y.o.   MRN: [id number]  Alex Green is a 56 y.o. male presenting on 03/27/2022 for mobility exam, weakness in legs      HPI  Chief Complaint: MOBILITY EXAMINATION   1. Change to now require power mobility device (PMD) - Worsening generalized weakness in lower extremities and core strength and upper body, diabetic neuropathy in lower extremities, with recurrent falls. Unable to ambulate safely with devices Weakness with upper extremity core strength, grip Uses cane / walker for short distance ambulation Assistance with getting around his home and also out from vehicle to appointments and stores  Engineer, water Assessment  - Patient has difficulty with reaching different areas within the house such as kitchen and bathroom as he is limited in mobility, an electric scooter would help access different areas of house more frequently and perform ADLs such as meal prep and cooking in kitchen and toileting and errands in store and doctors office  - Unable to use cane or walker regularly due to lower extremity weakness and pain and upper extremity reduced sensation moderate 6 out of 10 and has fallen while using both cane and walker due to pain and numbness neuropathy causing weakness and lack of support.  - Unable to use manual wheelchair due to reduced upper and lower body strength and limited range of motion  - He would be able to use a scooter (power device), and he is able to transfer to chair and transfer off scooter.       03/27/2022    1:43 PM 02/04/2022   12:21 PM 10/05/2021   10:32 AM  Depression screen PHQ 2/9  Decreased Interest 1 1 1   Down, Depressed, Hopeless 1 1 1   PHQ - 2 Score 2 2 2   Altered sleeping 1 1 1   Tired, decreased energy 1 0 1  Change in appetite 1 0 1  Feeling bad or failure about yourself  1 1 1   Trouble concentrating 0 0 0  Moving slowly or fidgety/restless 0 0 0   Suicidal thoughts  0 0  PHQ-9 Score 6 4 6   Difficult doing work/chores Somewhat difficult  Somewhat difficult    Social History   Tobacco Use   Smoking status: Every Day    Packs/day: 1.50    Years: 10.00    Total pack years: 15.00    Types: Cigarettes   Smokeless tobacco: Never  Substance Use Topics   Alcohol use: Yes    Alcohol/week: 50.0 standard drinks of alcohol    Types: 50 Cans of beer per week   Drug use: Not Currently    Review of Systems Per HPI unless specifically indicated above     Objective:    BP (!) 165/95   Pulse (!) 102   Ht 5\' 8"  (1.727 m)   Wt 138 lb (62.6 kg)   SpO2 95%   BMI 20.98 kg/m   Wt Readings from Last 3 Encounters:  03/27/22 138 lb (62.6 kg)  01/16/22 158 lb (71.7 kg)  01/08/22 159 lb (72.1 kg)    Physical Exam Vitals and nursing note reviewed.  Constitutional:      General: He is not in acute distress.    Appearance: He is well-developed. He is not diaphoretic.     Comments: Chronically ill and thin appearing, comfortable, cooperative  HENT:     Head: Normocephalic and atraumatic.  Eyes:  General:        Right eye: No discharge.        Left eye: No discharge.     Conjunctiva/sclera: Conjunctivae normal.     Pupils: Pupils are equal, round, and reactive to light.  Neck:     Thyroid: No thyromegaly.  Cardiovascular:     Rate and Rhythm: Normal rate and regular rhythm.     Pulses: Normal pulses.     Heart sounds: Normal heart sounds. No murmur heard. Pulmonary:     Effort: Pulmonary effort is normal. No respiratory distress.     Breath sounds: Normal breath sounds. No wheezing or rales.  Abdominal:     General: Bowel sounds are normal. There is no distension.     Palpations: Abdomen is soft. There is no mass.     Tenderness: There is no abdominal tenderness.  Musculoskeletal:     Cervical back: Normal range of motion and neck supple.  Lymphadenopathy:     Cervical: No cervical adenopathy.  Skin:    General: Skin  is warm and dry.     Findings: No erythema or rash (Dry skin dermatitis R lower ext).  Neurological:     Mental Status: He is alert and oriented to person, place, and time.     Comments: Distal sensation intact to light touch all extremities  Psychiatric:        Mood and Affect: Mood normal.        Behavior: Behavior normal.        Thought Content: Thought content normal.     Comments: Well groomed, good eye contact, normal speech and thoughts    - Ambulatory Exam - Able to stand from seated, uses walker for ambulation  Upper and Lower Extremity Assessment RUE - Strength - 4 out of 5 - Pain - 2 out of 10 - Range of Motion - Mostly full range   LUE - Strength - 3 out of 5 - Pain - 2 out of 10 - Range of Motion - mostly full range   RLE - Strength 2 out of 5 - Pain - 5 out of 10 - Range of Motion - able to flex and extend, reduced range of motion   LLE - Strength - 2 out of 5 - Pain - 7 out of 10 - Range of Motion - able to flex and extend, reduced range of motion    Results for orders placed or performed during the hospital encounter of 01/16/22  Comprehensive metabolic panel  Result Value Ref Range   Sodium 129 (L) 135 - 145 mmol/L   Potassium 3.1 (L) 3.5 - 5.1 mmol/L   Chloride 92 (L) 98 - 111 mmol/L   CO2 23 22 - 32 mmol/L   Glucose, Bld 147 (H) 70 - 99 mg/dL   BUN 15 6 - 20 mg/dL   Creatinine, Ser 5.62 (H) 0.61 - 1.24 mg/dL   Calcium 8.9 8.9 - 13.0 mg/dL   Total Protein 8.3 (H) 6.5 - 8.1 g/dL   Albumin 3.7 3.5 - 5.0 g/dL   AST 26 15 - 41 U/L   ALT 17 0 - 44 U/L   Alkaline Phosphatase 120 38 - 126 U/L   Total Bilirubin 1.3 (H) 0.3 - 1.2 mg/dL   GFR, Estimated >86 >57 mL/min   Anion gap 14 5 - 15  CBC with Differential  Result Value Ref Range   WBC 11.4 (H) 4.0 - 10.5 K/uL   RBC 4.74 4.22 - 5.81  MIL/uL   Hemoglobin 14.6 13.0 - 17.0 g/dL   HCT 42.3 39.0 - 52.0 %   MCV 89.2 80.0 - 100.0 fL   MCH 30.8 26.0 - 34.0 pg   MCHC 34.5 30.0 - 36.0 g/dL   RDW 12.5  11.5 - 15.5 %   Platelets 260 150 - 400 K/uL   nRBC 0.0 0.0 - 0.2 %   Neutrophils Relative % 84 %   Neutro Abs 9.5 (H) 1.7 - 7.7 K/uL   Lymphocytes Relative 8 %   Lymphs Abs 0.9 0.7 - 4.0 K/uL   Monocytes Relative 7 %   Monocytes Absolute 0.8 0.1 - 1.0 K/uL   Eosinophils Relative 0 %   Eosinophils Absolute 0.0 0.0 - 0.5 K/uL   Basophils Relative 0 %   Basophils Absolute 0.0 0.0 - 0.1 K/uL   Immature Granulocytes 1 %   Abs Immature Granulocytes 0.15 (H) 0.00 - 0.07 K/uL  Magnesium  Result Value Ref Range   Magnesium 2.2 1.7 - 2.4 mg/dL  Ethanol  Result Value Ref Range   Alcohol, Ethyl (B) <10 <10 mg/dL      Assessment & Plan:   Problem List Items Addressed This Visit     Impairment of balance   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 - PMD is necessary for patient's mobility to get to the kitchen to prepare meals - PMD is necessary for patient's mobility to get to the bedroom to dress and sleep   Cane or Walker Patient cannot use a cane / walker for long distances of ambulation, he can use them to transfer from one location to other seated to standing and to chair.  Limited by generalized weakness limiting his ability to properly and safely use these devices. His weakness as listed above with RUE and LUE   Manual Wheelchair Patient cannot use MWC due to generalized weakness limiting his ability to properly and safely use this device to propel movement. His weakness as listed above with RUE and LUE   Scooter  (POV) Patient can use a POV as he has enough postural stability but lacks extremity strength.   He is mentally capable to operate an Transport planner.  Patient is willing and motivated to use the power mobility device in his home to improve his quality of life.   Handwritten order given to patient and list of DME stores. He will submit and we can complete any specific order form if when received  Type 2 Diabetes Recent Labs    06/26/21 1457 01/02/22 1409  HGBA1C 5.3 5.5   Currently managing Type 2 Diabetes  Will agree to sign off on orders for Diabetic Shoes. Will need the order form from patient / podiatry [facility]), we will call them today to check if they were able to receive the last order or if they can send [place] a new document to sign. - Patient would benefit from Diabetic Shoes due to neuropathy with callus formation and great toe deformity causing pain, diabetes control is improving on current regimen, and I am continuing to  monitor and manage diabetes.  Note his shoes were attempted to be ordered months ago, they have arrived at [facility] back in July 2023, but we have not received paperwork, now patient requested, and we called podiatry and they will be faxing a form to our office today for me to sign. We will fax back and they can schedule patient to pick up shoes.  No orders of the defined types were placed in this encounter.    Follow up plan: Return if symptoms worsen or fail to improve.   Saralyn Pilar, DO [facility] 03/27/2022, 1:34 PM

## 2022-03-27 NOTE — Patient Instructions (Addendum)
Thank you for coming to the office today.  Handwritten order today for the [employer] for mobility.  Take this to your preferred Medical Supply Store that offers mobility devices.  [facility] [address] [place], [place] [address] Ph: [place]. [address] [place], [place] [address] Ph: [phone] Fax: [phone]  ---------------  We called Dr Stephenie Acres office and we will sign off one more time on the Diabetic Shoes.   [facility]) Retail: [phone] Customer Service: [phone] Toll Free: [phone] FAX: [phone]  [address] [place] [place], [place] [address] Open until [place] Phone: [phone] Fax: [phone]  Please schedule a Follow-up Appointment to: Return if symptoms worsen or fail to improve.  If you have any other questions or concerns, please feel free to call the office or send a message through [place]. You may also schedule an earlier appointment if necessary.  Additionally, you may be receiving a survey about your experience at our office within a few days to 1 week by e-mail or mail. We value your feedback.  Nobie Putnam, DO [place]

## 2022-03-29 ENCOUNTER — Telehealth: Payer: Self-pay | Admitting: Podiatry

## 2022-03-29 NOTE — Telephone Encounter (Signed)
Pt called to see if we sent the diabetic shoes paperwork to his pcp Dr Nobie Putnam this week/ Please call pt and advise.

## 2022-04-02 NOTE — Telephone Encounter (Signed)
Pt is calling back about the diabetic shoe order. Has called and left several voice messages and hasnt heard back. Pt wants to know if PCP received the documentation and if we got it back to order his shoes.  Please advise

## 2022-04-03 ENCOUNTER — Other Ambulatory Visit: Payer: Self-pay | Admitting: Family Medicine

## 2022-04-03 ENCOUNTER — Ambulatory Visit (INDEPENDENT_AMBULATORY_CARE_PROVIDER_SITE_OTHER): Payer: Medicare Other

## 2022-04-03 DIAGNOSIS — R29898 Other symptoms and signs involving the musculoskeletal system: Secondary | ICD-10-CM

## 2022-04-03 DIAGNOSIS — I10 Essential (primary) hypertension: Secondary | ICD-10-CM

## 2022-04-03 DIAGNOSIS — R296 Repeated falls: Secondary | ICD-10-CM

## 2022-04-03 DIAGNOSIS — E1140 Type 2 diabetes mellitus with diabetic neuropathy, unspecified: Secondary | ICD-10-CM

## 2022-04-03 DIAGNOSIS — F331 Major depressive disorder, recurrent, moderate: Secondary | ICD-10-CM

## 2022-04-03 DIAGNOSIS — G8929 Other chronic pain: Secondary | ICD-10-CM

## 2022-04-03 DIAGNOSIS — E1169 Type 2 diabetes mellitus with other specified complication: Secondary | ICD-10-CM

## 2022-04-03 DIAGNOSIS — F411 Generalized anxiety disorder: Secondary | ICD-10-CM

## 2022-04-03 DIAGNOSIS — E639 Nutritional deficiency, unspecified: Secondary | ICD-10-CM

## 2022-04-03 DIAGNOSIS — G4700 Insomnia, unspecified: Secondary | ICD-10-CM

## 2022-04-03 DIAGNOSIS — R630 Anorexia: Secondary | ICD-10-CM

## 2022-04-03 DIAGNOSIS — R627 Adult failure to thrive: Secondary | ICD-10-CM

## 2022-04-03 MED ORDER — MIRTAZAPINE 15 MG PO TABS: 15.0000 mg | ORAL_TABLET | Freq: Every day | ORAL | 2 refills | Status: DC

## 2022-04-03 NOTE — Chronic Care Management (AMB) (Signed)
Chronic Care Management   CCM RN Visit Note  04/03/2022 Name: Alex Green MRN: [id number] DOB: 05/25/1966  Subjective: Alex Green is a 56 y.o. year old male who is a primary care patient of [name], Alexander J, DO. The care management team was consulted for assistance with disease management and care coordination needs.    Engaged with patient by telephone for follow up visit in response to provider referral for case management and/or care coordination services.   Consent to Services:  The patient was given information about Chronic Care Management services, agreed to services, and gave verbal consent prior to initiation of services.  Please see initial visit note for detailed documentation.   Patient agreed to services and verbal consent obtained.   Assessment: Review of patient past medical history, allergies, medications, health status, including review of consultants reports, laboratory and other test data, was performed as part of comprehensive evaluation and provision of chronic care management services.   SDOH (Social Determinants of Health) assessments and interventions performed:  SDOH Interventions    Flowsheet Row Office Visit from 03/27/2022 in [facility] Care Coordination from 02/26/2022 in [facility] Care Coordination Chronic Care Management from 02/04/2022 in [facility] Clinical Support from 10/05/2021 in [facility] Office Visit from 06/26/2021 in [facility] Office Visit from 02/14/2021 in [facility]  SDOH Interventions        Food Insecurity Interventions -- Intervention Not Indicated Intervention Not Indicated Intervention Not Indicated -- --  Housing Interventions -- Ambulatory [id number] Order -- Intervention Not Indicated -- --  Transportation Interventions -- Intervention Not Indicated -- Intervention Not Indicated -- --  Depression  Interventions/Treatment  Medication, Currently on Treatment -- -- Medication, Currently on Treatment Currently on Treatment, Counseling Counseling, Currently on Treatment  Financial Strain Interventions -- -- -- Intervention Not Indicated -- --  Physical Activity Interventions -- -- -- Patient Refused -- --  Stress Interventions -- -- -- Intervention Not Indicated -- --  Social Connections Interventions -- -- Intervention Not Indicated Intervention Not Indicated -- --        CCM Care Plan  Allergies  Allergen Reactions   Other     "Bioxin gives me a yeast infection"    Outpatient Encounter Medications as of 04/03/2022  Medication Sig   amoxicillin (AMOXIL) 500 MG capsule Take 1 capsule (500 mg total) by mouth 3 (three) times daily. (Patient not taking: Reported on 02/21/2022)   clotrimazole-betamethasone (LOTRISONE) cream Apply 2 times a day for worsening flare dry skin dermatitis of toes/feet, may re-use daily up to 1 week as needed.   fluticasone (FLONASE) 50 MCG/ACT nasal spray PLACE 2 SPRAYS INTO BOTH NOSTRILS DAILY FOR 4-6 WEEKS THEN STOP AND USE SEASONALLY OR AS NEEDED.   levETIRAcetam (KEPPRA) 500 MG tablet Take 1 tablet (500 mg total) by mouth 2 (two) times daily for 7 days.   levETIRAcetam (KEPPRA) 500 MG tablet levetiracetam 500 mg tablet  TAKE 1 TABLET BY MOUTH TWICE A DAY FOR 7 DAYS (Patient not taking: Reported on 10/05/2021)   mirtazapine (REMERON) 15 MG tablet Take 1 tablet (15 mg total) by mouth at bedtime.   ondansetron (ZOFRAN-ODT) 4 MG disintegrating tablet Take 1 tablet (4 mg total) by mouth every 8 (eight) hours as needed for nausea or vomiting.   rOPINIRole (REQUIP) 0.5 MG tablet Take 1 tablet (0.5 mg total) by mouth at bedtime. May increase to 2 pills at bedtime for   dose 1mg nightly if needed.   zolpidem (AMBIEN CR) 12.5 MG CR tablet Take 1 tablet (12.5 mg total) by mouth at bedtime as needed for sleep.   No facility-administered encounter medications on file as of  04/03/2022.    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) 06/24/2020   Controlled type 2 diabetes mellitus with neuropathy (HCC) 04/21/2020   Essential hypertension 04/21/2020   GAD (generalized anxiety disorder) 04/21/2020   GERD (gastroesophageal reflux disease) 04/21/2020   Tobacco abuse 04/21/2020   Alcohol dependence in early full remission (HCC) 04/21/2020   Status post left foot surgery 09/04/2017   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   Accidentally caught in or between objects 08/04/2012    Conditions to be addressed/monitored:HTN, HLD, Anxiety, Depression, and failure to thrive, and frequent falls, pain  Care Plan : RNCM: Development of Plan of care for Chronic Disease Management and Care Coordination Needs  Updates made by ,  J, RN since 04/03/2022 12:00 AM     Problem: RNCM: Development of plan of care for: Depression, Anxiety, Failure to thrive, HTN, HLD, Frequent falls   Priority: High     Long-Range Goal: RNCM: Effctive Management of plan of care for: Depression, Anxiety, Failure to thrive, HTN, HLD, Frequent falls, pain   Start Date: 02/04/2022  Expected End Date: 02/05/2023  Priority: High  Note:   Current Barriers:  Care Coordination needs related to Limited social support, Level of care concerns, ADL IADL limitations, Mental Health Concerns , Inability to perform ADL's independently, Inability to perform IADL's independently, and frequent falls  Lacks caregiver support.         Falls  RNCM Clinical Goal(s):  Patient will verbalize basic understanding of HTN, HLD, Anxiety, Depression, and Failure to Thrive, and frequent falls disease process and self health management plan as evidenced by meeting the patients needs, keeping appointments, calling the provider or team for questions, concerns, or new needs take all medications exactly as prescribed and will call provider for medication related questions as evidenced by taking medications as directed and calling the pharmacist for questions    work with social worker to address Limited social support, Level of care concerns, ADL IADL limitations, Mental Health Concerns , and Inability to perform IADL's independently related to the management of HTN, HLD, Anxiety, Depression, and failure to thrive and frequent falls as evidenced by review of EMR and patient or social worker report     demonstrate a decrease in HTN, HLD, Anxiety, Depression, and failure to thrive and frequent falls exacerbations as evidenced by stable VS, decreased falls, no acute exacerbations, and working with the CCM team to optimize the plan of care for effective management of the patients chronic conditions demonstrate ongoing self health care management ability of effective management of chronic conditions as evidenced by     through collaboration with RN Care manager, provider, and care team.  Patient reports he checked CBG BID, reports he has Alpha-gal and this limits what he can eat.  Interventions: 1:1 collaboration with primary care provider regarding development and   update of comprehensive plan of care as evidenced by provider attestation and co-signature Inter-disciplinary care team collaboration (see longitudinal plan of care) Evaluation of current treatment plan related to  self management and patient's adherence to plan as established by provider   Falls:  (Status: Goal on Track (progressing): YES.) Long Term Goal  Provided written and verbal  education re: potential causes of falls and Fall prevention strategies Reviewed medications and reinforced potential side effects of medications such as dizziness and frequent urination Advised patient of importance of notifying provider of falls.  The patient states that he loses his balance a lot and has frequent falls.  Reinforced on notifying the provider of new falls or safety concerns. The patient states he has had some near misses but has not falling. Working on getting new DME to help with increasing his mobility. Collaboration with pcp on RX needed for DME because the other one was misplaced. The patient can pick up the new RX at the office starting on 04-04-2022. Call made back to the patient and advised the patient of the providers recommendations. Assessed for signs and symptoms of orthostatic hypotension Assessed patients knowledge of fall risk prevention secondary to previously provided education. Review of being safe and monitoring for changes in conditions that affect his safety and well being.  Advised patient to discuss safety and fall concerns with provider  Anxiety, Depression, Failure to Thrive  (Status: Goal on Track (progressing): YES.) Long Term Goal  Evaluation of current treatment plan related to Anxiety, Depression, and failure to thrive , Limited social support, Level of care concerns, ADL IADL limitations, Inability to perform ADL's independently, Inability to perform IADL's independently, and frequent falls  self-management and patient's adherence to plan as established by provider. Discussed plans with patient for ongoing care management follow up and provided patient with direct contact information for care management team Advised patient to call the office for changes in his mood, anxiety, depression, or mental health; Provided education to patient re: working with the CCM team to effectively manage his chronic health needs. The patient feels if he had some help in the  home this would help him a lot. He cannot empty his bedside commode and his girlfriend that lives with him is unable to do this also due to her health issues. The patient states that things are about the same. He says the rat/mice are gone and the roaches situation is about 75 to 80% fixed.  He still has been unable to get the belongs of the people that were living there out. Feels that he is doing some better with his mobility; Reviewed medications with patient and discussed compliance. States he really only takes Ambien. The pcp sent in an order for medication to help with appetite and sleep. Education provided to the patient; Verified with patient that he is taking requip for restless leg ; Offered support related to anxiety, depression Social Work currently working with patient, reviewed importance of continuing to work with social worker; Discussed plans with patient for ongoing care management follow up and provided patient with direct contact information for care management team; Reinforced patient to discuss changes in his mood, or mental health needs  with provider; Reviewed importance of avoiding red meat (pork, beef, venison) with Alpha-gal. Review of making sure he is staying hydrated and getting protein. He states that he does not have an appetite. Ask about medications to help with appetite. Collaboration with the pcp on recommendations for help with appetite. Review of   drinking boost or ensure and the patient states he has some of this on hand.  Reviewed importance of having adequate protein at each meal  Hyperlipidemia:  (Status: Goal on Track (progressing): YES.) Long Term Goal  Lab Results  Component Value Date   CHOL 150 01/02/2022   HDL 35 (L) 01/02/2022   LDLCALC 94 01/02/2022   TRIG 115 01/02/2022   CHOLHDL 4.3 01/02/2022     Medication review performed; medication list updated in electronic medical record.  Provider established cholesterol goals reviewed; Counseled on  importance of regular laboratory monitoring as prescribed; Reviewed importance of limiting foods high in cholesterol. Review and education done.; Reviewed importance of following heart healthy diet  Hypertension: (Status: Goal on Track (progressing): YES.) Long Term Goal  Last practice recorded BP readings:  BP Readings from Last 3 Encounters:  03/27/22 (!) 165/95  01/17/22 (!) 154/77  01/02/22 132/80  Most recent eGFR/CrCl:  Lab Results  Component Value Date   EGFR 74 01/02/2022    No components found for: "CRCL"  Evaluation of current treatment plan related to hypertension self management and patient's adherence to plan as established by provider. Blood pressure is stable. The patient denies any needs at this time related to blood pressure;   Reviewed prescribed diet Heart Healthy diet. Review and education provided Advised patient, providing education and rationale, to monitor blood pressure daily and record, calling PCP for findings outside established parameters;  Discussed complications of poorly controlled blood pressure such as heart disease, stroke, circulatory complications, vision complications, kidney impairment, sexual dysfunction;    Pain:  (Status: Goal on Track (progressing): YES.) Long Term Goal  Pain assessment completed. Denies any pain today.  Medications reviewed Reviewed provider established plan for pain management; Discussed importance of adherence to all scheduled medical appointments; Counseled on the importance of reporting any/all new or changed pain symptoms or management strategies to pain management provider; Advised patient to report to care team affect of pain on daily activities; Reinforced use of relaxation techniques and/or diversional activities to assist with pain reduction (distraction, imagery, relaxation, massage, acupressure, TENS, heat, and cold application; Reinforced with patient prescribed pharmacological and nonpharmacological pain relief  strategies; Advised patient to discuss changes in level or intensity of pain, or unresolved pain with provider;   Patient Goals/Self-Care Activities: Take medications as prescribed   Attend all scheduled provider appointments Call pharmacy for medication refills 3-7 days in advance of running out of medications Perform all self care activities independently  Call provider office for new concerns or questions  Work with the social worker to address care coordination needs and will continue to work with the clinical team to address health care and disease management related needs call the Suicide and Crisis Lifeline: 988 call the [place] National Suicide Prevention Lifeline: 1-800-273-8255 or TTY: 1-800-799-4 TTY (1-800-799-4889) to talk to a trained counselor call 1-800-273-TALK (toll free, 24 hour hotline) if experiencing a Mental Health or Behavioral Health Crisis  call doctor for signs and symptoms of high blood pressure keep all doctor appointments report new symptoms to your doctor eat more whole grains, fruits and vegetables, lean meats and healthy fats - call doctor when you experience any new symptoms - go to all doctor appointments as scheduled - adhere to prescribed diet: heart healthy diet Avoid red meat (pork, beef, venison) with Alpha-gal Include some protein with each meal Continue working with social worker for assistance with applying for medicaid and assistance in the home Continue to check blood sugar as   ordered by your doctor fall prevention strategies: change position slowly, use assistive device such as walker or cane (per provider recommendations) when walking, keep walkways clear, have good lighting in room. It is important to contact your provider if you have any falls, maintain muscle strength/tone by exercise per provider recommendations.       Plan:Telephone follow up appointment with care management team member scheduled for:  05-01-2022 at 2 pm   Pam  RN,  MSN, CCM RN Care Manager  Chronic Care Management Direct Number: [phone]          

## 2022-04-03 NOTE — Patient Instructions (Signed)
Visit Information  Thank you for taking time to visit with me today. Please don't hesitate to contact me if I can be of assistance to you before our next scheduled telephone appointment.  Following are the goals we discussed today:  RNCM Clinical Goal(s):  Patient will verbalize basic understanding of HTN, HLD, Anxiety, Depression, and Failure to Thrive, and frequent falls disease process and self health management plan as evidenced by meeting the patients needs, keeping appointments, calling the provider or team for questions, concerns, or new needs take all medications exactly as prescribed and will call provider for medication related questions as evidenced by taking medications as directed and calling the pharmacist for questions    work with social worker to address Limited social support, Level of care concerns, ADL IADL limitations, Mental Health Concerns , and Inability to perform IADL's independently related to the management of HTN, HLD, Anxiety, Depression, and failure to thrive and frequent falls as evidenced by review of EMR and patient or social worker report     demonstrate a decrease in HTN, HLD, Anxiety, Depression, and failure to thrive and frequent falls exacerbations as evidenced by stable VS, decreased falls, no acute exacerbations, and working with the CCM team to optimize the plan of care for effective management of the patients chronic conditions demonstrate ongoing self health care management ability of effective management of chronic conditions as evidenced by     through collaboration with RN Care manager, provider, and care team.  Patient reports he checked CBG BID, reports he has Alpha-gal and this limits what he can eat.  Interventions: 1:1 collaboration with primary care provider regarding development and update of comprehensive plan of care as evidenced by provider attestation and co-signature Inter-disciplinary care team collaboration (see longitudinal plan of  care) Evaluation of current treatment plan related to  self management and patient's adherence to plan as established by provider   Falls:  (Status: Goal on Track (progressing): YES.) Long Term Goal  Provided written and verbal education re: potential causes of falls and Fall prevention strategies Reviewed medications and reinforced potential side effects of medications such as dizziness and frequent urination Advised patient of importance of notifying provider of falls.  The patient states that he loses his balance a lot and has frequent falls.  Reinforced on notifying the provider of new falls or safety concerns. The patient states he has had some near misses but has not falling. Working on getting new DME to help with increasing his mobility. Collaboration with pcp on RX needed for DME because the other one was misplaced. The patient can pick up the new RX at the office starting on 04-04-2022. Call made back to the patient and advised the patient of the providers recommendations. Assessed for signs and symptoms of orthostatic hypotension Assessed patients knowledge of fall risk prevention secondary to previously provided education. Review of being safe and monitoring for changes in conditions that affect his safety and well being.  Advised patient to discuss safety and fall concerns with provider  Anxiety, Depression, Failure to Thrive  (Status: Goal on Track (progressing): YES.) Long Term Goal  Evaluation of current treatment plan related to Anxiety, Depression, and failure to thrive, Limited social support, Level of care concerns, ADL IADL limitations, Inability to perform ADL's independently, Inability to perform IADL's independently, and frequent falls self-management and patient's adherence to plan as established by provider. Discussed plans with patient for ongoing care management follow up and provided patient with direct contact information for care  management team Advised patient to call  the office for changes in his mood, anxiety, depression, or mental health; Provided education to patient re: working with the CCM team to effectively manage his chronic health needs. The patient feels if he had some help in the home this would help him a lot. He cannot empty his bedside commode and his girlfriend that lives with him is unable to do this also due to her health issues. The patient states that things are about the same. He says the rat/mice are gone and the roaches situation is about 75 to 80% fixed.  He still has been unable to get the belongs of the people that were living there out. Feels that he is doing some better with his mobility; Reviewed medications with patient and discussed compliance. States he really only takes Ambien. The pcp sent in an order for medication to help with appetite and sleep. Education provided to the patient; Verified with patient that he is taking requip for restless leg ; Offered support related to anxiety, depression Social Work currently working with patient, reviewed importance of continuing to work with Education officer, museum; Discussed plans with patient for ongoing care management follow up and provided patient with direct contact information for care management team; Reinforced patient to discuss changes in his mood, or mental health needs  with provider; Reviewed importance of avoiding red meat (pork, beef, venison) with Alpha-gal. Review of making sure he is staying hydrated and getting protein. He states that he does not have an appetite. Ask about medications to help with appetite. Collaboration with the pcp on recommendations for help with appetite. Review of drinking boost or ensure and the patient states he has some of this on hand.  Reviewed importance of having adequate protein at each meal  Hyperlipidemia:  (Status: Goal on Track (progressing): YES.) Long Term Goal  Lab Results  Component Value Date   CHOL 150 01/02/2022   HDL 35 (L) 01/02/2022    LDLCALC 94 01/02/2022   TRIG 115 01/02/2022   CHOLHDL 4.3 01/02/2022     Medication review performed; medication list updated in electronic medical record.  Provider established cholesterol goals reviewed; Counseled on importance of regular laboratory monitoring as prescribed; Reviewed importance of limiting foods high in cholesterol. Review and education done.; Reviewed importance of following heart healthy diet  Hypertension: (Status: Goal on Track (progressing): YES.) Long Term Goal  Last practice recorded BP readings:  BP Readings from Last 3 Encounters:  03/27/22 (!) 165/95  01/17/22 (!) 154/77  01/02/22 132/80   Most recent eGFR/CrCl:  Lab Results  Component Value Date   EGFR 74 01/02/2022    No components found for: "CRCL"  Evaluation of current treatment plan related to hypertension self management and patient's adherence to plan as established by provider. Blood pressure is stable. The patient denies any needs at this time related to blood pressure;   Reviewed prescribed diet Heart Healthy diet. Review and education provided Advised patient, providing education and rationale, to monitor blood pressure daily and record, calling PCP for findings outside established parameters;  Discussed complications of poorly controlled blood pressure such as heart disease, stroke, circulatory complications, vision complications, kidney impairment, sexual dysfunction;    Pain:  (Status: Goal on Track (progressing): YES.) Long Term Goal  Pain assessment completed. Denies any pain today.  Medications reviewed Reviewed provider established plan for pain management; Discussed importance of adherence to all scheduled medical appointments; Counseled on the importance of reporting any/all new or changed  pain symptoms or management strategies to pain management provider; Advised patient to report to care team affect of pain on daily activities; Reinforced use of relaxation techniques and/or  diversional activities to assist with pain reduction (distraction, imagery, relaxation, massage, acupressure, TENS, heat, and cold application; Reinforced with patient prescribed pharmacological and nonpharmacological pain relief strategies; Advised patient to discuss changes in level or intensity of pain, or unresolved pain with provider;   Patient Goals/Self-Care Activities: Take medications as prescribed   Attend all scheduled provider appointments Call pharmacy for medication refills 3-7 days in advance of running out of medications Perform all self care activities independently  Call provider office for new concerns or questions  Work with the social worker to address care coordination needs and will continue to work with the clinical team to address health care and disease management related needs call the Suicide and Crisis Lifeline: 988 call the [place] National Suicide Prevention Lifeline: 407-600-9646 or TTY: 267-412-7794 TTY 281-467-0869) to talk to a trained counselor call 1-800-273-TALK (toll free, 24 hour hotline) if experiencing a Mental Health or [place]  call doctor for signs and symptoms of high blood pressure keep all doctor appointments report new symptoms to your doctor eat more whole grains, fruits and vegetables, lean meats and healthy fats - call doctor when you experience any new symptoms - go to all doctor appointments as scheduled - adhere to prescribed diet: heart healthy diet Avoid red meat (pork, beef, venison) with Alpha-gal Include some protein with each meal Continue working with Education officer, museum for assistance with applying for medicaid and assistance in the home Continue to check blood sugar as ordered by your doctor fall prevention strategies: change position slowly, use assistive device such as walker or cane (per provider recommendations) when walking, keep walkways clear, have good lighting in room. It is important to contact your provider  if you have any falls, maintain muscle strength/tone by exercise per provider recommendations.    Our next appointment is by telephone on 05-01-2022  at 2 pm  Please call the care guide team at [phone] if you need to cancel or reschedule your appointment.   If you are experiencing a Mental Health or [place] or need someone to talk to, please call the Suicide and Crisis Lifeline: 988 call the [place] National Suicide Prevention Lifeline: 423-354-8792 or TTY: 845-593-2409 TTY (575)246-7687) to talk to a trained counselor call 1-800-273-TALK (toll free, 24 hour hotline)   The patient verbalized understanding of instructions, educational materials, and care plan provided today and DECLINED offer to receive copy of patient instructions, educational materials, and care plan.   Noreene Larsson RN, MSN, CCM RN Care Manager  Chronic Care Management Direct Number: [phone]

## 2022-04-03 NOTE — Progress Notes (Signed)
Rx Mirtazapine for appetite sleep sent to [facility]  Re written Electric Scooter rx   Nobie Putnam, [facility] 04/03/2022, 12:47 PM

## 2022-04-04 ENCOUNTER — Telehealth: Payer: Medicare Other

## 2022-04-10 ENCOUNTER — Ambulatory Visit: Payer: Medicare Other

## 2022-04-10 DIAGNOSIS — M2011 Hallux valgus (acquired), right foot: Secondary | ICD-10-CM

## 2022-04-10 DIAGNOSIS — E1142 Type 2 diabetes mellitus with diabetic polyneuropathy: Secondary | ICD-10-CM

## 2022-04-10 DIAGNOSIS — M2012 Hallux valgus (acquired), left foot: Secondary | ICD-10-CM

## 2022-04-10 DIAGNOSIS — E1140 Type 2 diabetes mellitus with diabetic neuropathy, unspecified: Secondary | ICD-10-CM

## 2022-04-10 NOTE — Progress Notes (Unsigned)
Patient presents today to pick up diabetic shoes and insoles.  Patient was dispensed 1 pair of diabetic shoes and 3 pairs of foam casted diabetic insoles. Fit was satisfactory. Instructions for break-in and wear was reviewed and a copy was given to the patient.   Re-appointment for regularly scheduled diabetic foot care visits or if they should experience any trouble with the shoes or insoles.  

## 2022-04-16 DIAGNOSIS — E785 Hyperlipidemia, unspecified: Secondary | ICD-10-CM

## 2022-04-16 DIAGNOSIS — I10 Essential (primary) hypertension: Secondary | ICD-10-CM

## 2022-04-17 ENCOUNTER — Ambulatory Visit (INDEPENDENT_AMBULATORY_CARE_PROVIDER_SITE_OTHER): Payer: Medicare Other

## 2022-04-17 DIAGNOSIS — F331 Major depressive disorder, recurrent, moderate: Secondary | ICD-10-CM

## 2022-04-17 DIAGNOSIS — I10 Essential (primary) hypertension: Secondary | ICD-10-CM

## 2022-04-17 DIAGNOSIS — E1140 Type 2 diabetes mellitus with diabetic neuropathy, unspecified: Secondary | ICD-10-CM

## 2022-04-17 NOTE — Chronic Care Management (AMB) (Signed)
Chronic Care Management   CCM RN Visit Note  04/17/2022 Name: Alex Green MRN: [id number] DOB: 08/04/1965  Subjective: Alex Green is a 56 y.o. year old male who is a primary care patient of [name], DO. The patient was referred to the Chronic Care Management team for assistance with care management needs subsequent to provider initiation of CCM services and plan of care.    Today's Visit:  Engaged with patient by telephone for follow up visit.     SDOH Interventions Today    Flowsheet Row Most Recent Value  SDOH Interventions   Utilities Interventions Intervention Not Indicated  Stress Interventions Other (Comment)  [patient is concerned that he needs an electric wheelchair and does not know how to get this. Assistance from the RNCM]         Goals Addressed             This Visit's Progress    CCM Expected Outcome:  Monitor, Self-Manage and Reduce Symptoms of  depression       Current Barriers:  Knowledge Deficits related to how to effectively manage depression, maintain independence and utilization of resources to help patient meet his health and well being needs  Care Coordination needs related to help in the home, obtaining DME, effectively getting the help he needs for mental health and well being stability in a patient with depression  Chronic Disease Management support and education needs related to effective management of depression  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  Planned Interventions: Evaluation of current treatment plan related to depression and patient's adherence to plan as established by provider Advised patient to call the office for changes in mood, anxiety, depression, or mental health needs Provided education to patient re: calling insurance company for assistance in where to get paperwork sent for new DME: electric wheelchair Reviewed medications with patient and discussed  compliance Collaborated with pcp regarding the patients need for new DME to help with mobility progression  Discussed plans with patient for ongoing care management follow up and provided patient with direct contact information for care management team Advised patient to discuss changes in depression, anxiety, mood, and mental health needs, also changes in his environment that increase his risk of exacerbation of his depression  with provider Screening for signs and symptoms of depression related to chronic disease state  Assessed social determinant of health barriers  Symptom Management: Take medications as prescribed   Attend all scheduled provider appointments Call provider office for new concerns or questions  Work with the social worker to address care coordination needs and will continue to work with the clinical team to address health care and disease management related needs call the Suicide and Crisis Lifeline: 988 call the [place] National Suicide Prevention Lifeline: (713)757-7513 or TTY: (413)473-9667 TTY 219-699-3248) to talk to a trained counselor call 1-800-273-TALK (toll free, 24 hour hotline) if experiencing a Mental Health or Behavioral Health Crisis   Follow Up Plan: Telephone follow up appointment with care management team member scheduled for: 05-01-2022 at 2 pm       CCM Expected Outcome:  Monitor, Self-Manage and Reduce Symptoms of Diabetes, Neuropathy       Current Barriers:  Knowledge Deficits related to the need to effectively manage DM and the importance of proper nutrition and hydration in patient with DM Care Coordination needs related to how to obtain resources and help in the home in a patient with DM Chronic Disease Management support  and education needs related to effective management of DM 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 Non-adherence to scheduled provider appointments Non-adherence to prescribed medication  regimen The need for DME new electric wheelchair to aide in mobility needs   Planned Interventions: Provided education to patient about basic DM disease process; Reviewed medications with patient and discussed importance of medication adherence;        Reviewed prescribed diet with patient heart healthy/ADA; Counseled on importance of regular laboratory monitoring as prescribed;        Discussed plans with patient for ongoing care management follow up and provided patient with direct contact information for care management team;      Provided patient with written educational materials related to hypo and hyperglycemia and importance of correct treatment;       call provider for findings outside established parameters;       Referral made to social work team for assistance with support for in home care and needs. The LCSW has worked with the LCSW for assistance and needs related to in home care. 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 Education on extermination companies. The patient states the problem is 80% controlled at this time. Agencies will not come into the home until the patient has this problem taken care of ;      Review of patient status, including review of consultants reports, relevant laboratory and other test results, and medications completed;       Advised patient to discuss changes in DM health and well being with provider;      Screening for signs and symptoms of depression related to chronic disease state;        Assessed social determinant of health barriers;        The patient called and left a message asking the RNCM to call back. The patient is in need of an electric wheelchair and cannot find a DME company. Has found a company called [employer] that states they are an approved DME company. Education provided to the patient that he needs to call his insurance and see if they will approve this. Explained the pcp could send paperwork  but we would need a place to fax the orders. Education and support given. Will continue to monitor.  Symptom Management: Take medications as prescribed   Call pharmacy for medication refills 3-7 days in advance of running out of medications Call provider office for new concerns or questions  Work with the social worker to address care coordination needs and will continue to work with the clinical team to address health care and disease management related needs call the Suicide and Crisis Lifeline: 988 call the [place] National Suicide Prevention Lifeline: 463-104-4697 or TTY: 914-590-5315 TTY 719-816-8643) to talk to a trained counselor call 1-800-273-TALK (toll free, 24 hour hotline) if experiencing a Mental Health or [place]  check feet daily for cuts, sores or redness trim toenails straight across wash and dry feet carefully every day wear comfortable, cotton socks wear comfortable, well-fitting shoes  Follow Up Plan: Telephone follow up appointment with care management team member scheduled for: 05-01-2022 at 2 pm       CCM Expected Outcome:  Monitor, Self-Manage, and Reduce Symptoms of Hypertension       Current Barriers:  Knowledge Deficits related to the importance of maintaining normal blood pressure readings Chronic Disease Management support and education needs related to effective management  of HTN Lacks caregiver support.  Corporate treasurer.   Planned Interventions: Evaluation of current treatment plan related to hypertension self management and patient's adherence to plan as established by provider;   Provided education to patient re: stroke prevention, s/s of heart attack and stroke; Reviewed prescribed diet heart healthy/ADA Reviewed medications with patient and discussed importance of compliance;  Discussed plans with patient for ongoing care management follow up and provided patient with direct contact information for care management team; Advised  patient, providing education and rationale, to monitor blood pressure daily and record, calling PCP for findings outside established parameters;  Advised patient to discuss changes in  HTN or heart health with provider; Provided education on prescribed diet heart healthy/ADA diet ;  Discussed complications of poorly controlled blood pressure such as heart disease, stroke, circulatory complications, vision complications, kidney impairment, sexual dysfunction;  Screening for signs and symptoms of depression related to chronic disease state;  Assessed social determinant of health barriers;   Symptom Management: Take medications as prescribed   Attend all scheduled provider appointments Call pharmacy for medication refills 3-7 days in advance of running out of medications Call provider office for new concerns or questions  Work with the social worker to address care coordination needs and will continue to work with the clinical team to address health care and disease management related needs call the Suicide and Crisis Lifeline: 988 call the [place] National Suicide Prevention Lifeline: 7573372303 or TTY: 947 099 3767 TTY 8654118966) to talk to a trained counselor call 1-800-273-TALK (toll free, 24 hour hotline) if experiencing a Mental Health or Behavioral Health Crisis  check blood pressure weekly learn about high blood pressure call doctor for signs and symptoms of high blood pressure develop an action plan for high blood pressure keep all doctor appointments take medications for blood pressure exactly as prescribed report new symptoms to your doctor eat more whole grains, fruits and vegetables, lean meats and healthy fats  Follow Up Plan: Telephone follow up appointment with care management team member scheduled for: 05-01-2022 at 2 pm          Plan:Telephone follow up appointment with care management team member scheduled for:  05-01-2022 at 2 pm  Alto Denver RN, MSN, CCM RN Care  Manager  Chronic Care Management Direct Number: [phone]

## 2022-04-17 NOTE — Patient Instructions (Signed)
Please call the care guide team at [phone] if you need to cancel or reschedule your appointment.   If you are experiencing a Mental Health or [place] or need someone to talk to, please call the Suicide and Crisis Lifeline: 988 call the [place] National Suicide Prevention Lifeline: 901 544 4433 or TTY: (252) 854-2493 TTY (505) 659-7248) to talk to a trained counselor call 1-800-273-TALK (toll free, 24 hour hotline)   Following is a copy of your full provider care plan:   Goals Addressed             This Visit's Progress    CCM Expected Outcome:  Monitor, Self-Manage and Reduce Symptoms of  depression       Current Barriers:  Knowledge Deficits related to how to effectively manage depression, maintain independence and utilization of resources to help patient meet his health and well being needs  Care Coordination needs related to help in the home, obtaining DME, effectively getting the help he needs for mental health and well being stability in a patient with depression  Chronic Disease Management support and education needs related to effective management of depression  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  Planned Interventions: Evaluation of current treatment plan related to depression and patient's adherence to plan as established by provider Advised patient to call the office for changes in mood, anxiety, depression, or mental health needs Provided education to patient re: calling insurance company for assistance in where to get paperwork sent for new DME: electric wheelchair Reviewed medications with patient and discussed compliance Collaborated with pcp regarding the patients need for new DME to help with mobility progression  Discussed plans with patient for ongoing care management follow up and provided patient with direct contact information for care management team Advised patient to discuss changes in depression, anxiety,  mood, and mental health needs, also changes in his environment that increase his risk of exacerbation of his depression  with provider Screening for signs and symptoms of depression related to chronic disease state  Assessed social determinant of health barriers  Symptom Management: Take medications as prescribed   Attend all scheduled provider appointments Call provider office for new concerns or questions  Work with the social worker to address care coordination needs and will continue to work with the clinical team to address health care and disease management related needs call the Suicide and Crisis Lifeline: 988 call the [place] National Suicide Prevention Lifeline: (913)278-2022 or TTY: 516-318-5989 TTY 972-668-6129) to talk to a trained counselor call 1-800-273-TALK (toll free, 24 hour hotline) if experiencing a Mental Health or [place]   Follow Up Plan: Telephone follow up appointment with care management team member scheduled for: 05-01-2022 at 2 pm       CCM Expected Outcome:  Monitor, Self-Manage and Reduce Symptoms of Diabetes, Neuropathy       Current Barriers:  Knowledge Deficits related to the need to effectively manage DM and the importance of proper nutrition and hydration in patient with DM Care Coordination needs related to how to obtain resources and help in the home in a patient with DM Chronic Disease Management support and education needs related to effective management of DM 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 Non-adherence to scheduled provider appointments Non-adherence to prescribed medication regimen The need for DME new electric wheelchair to aide in mobility needs   Planned Interventions: Provided education to patient about basic DM disease process; Reviewed medications with patient and discussed importance  of medication adherence;        Reviewed prescribed diet with patient heart  healthy/ADA; Counseled on importance of regular laboratory monitoring as prescribed;        Discussed plans with patient for ongoing care management follow up and provided patient with direct contact information for care management team;      Provided patient with written educational materials related to hypo and hyperglycemia and importance of correct treatment;       call provider for findings outside established parameters;       Referral made to social work team for assistance with support for in home care and needs. The LCSW has worked with the LCSW for assistance and needs related to in home care. 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 Education on extermination companies. The patient states the problem is 80% controlled at this time. Agencies will not come into the home until the patient has this problem taken care of ;      Review of patient status, including review of consultants reports, relevant laboratory and other test results, and medications completed;       Advised patient to discuss changes in DM health and well being with provider;      Screening for signs and symptoms of depression related to chronic disease state;        Assessed social determinant of health barriers;        The patient called and left a message asking the RNCM to call back. The patient is in need of an electric wheelchair and cannot find a DME company. Has found a company called [employer] that states they are an approved DME company. Education provided to the patient that he needs to call his insurance and see if they will approve this. Explained the pcp could send paperwork but we would need a place to fax the orders. Education and support given. Will continue to monitor.  Symptom Management: Take medications as prescribed   Call pharmacy for medication refills 3-7 days in advance of running out of medications Call provider office for new concerns or questions  Work  with the social worker to address care coordination needs and will continue to work with the clinical team to address health care and disease management related needs call the Suicide and Crisis Lifeline: 988 call the [place] National Suicide Prevention Lifeline: (862) 873-5902 or TTY: 909-710-7022 TTY 661-612-0602) to talk to a trained counselor call 1-800-273-TALK (toll free, 24 hour hotline) if experiencing a Mental Health or [place]  check feet daily for cuts, sores or redness trim toenails straight across wash and dry feet carefully every day wear comfortable, cotton socks wear comfortable, well-fitting shoes  Follow Up Plan: Telephone follow up appointment with care management team member scheduled for: 05-01-2022 at 2 pm       CCM Expected Outcome:  Monitor, Self-Manage, and Reduce Symptoms of Hypertension       Current Barriers:  Knowledge Deficits related to the importance of maintaining normal blood pressure readings Chronic Disease Management support and education needs related to effective management of HTN Lacks caregiver support.  Film/video editor.   Planned Interventions: Evaluation of current treatment plan related to hypertension self management and patient's adherence to plan as established by provider;   Provided education to patient re: stroke prevention, s/s of heart attack and stroke; Reviewed prescribed diet heart healthy/ADA Reviewed medications with patient and discussed importance of compliance;  Discussed plans with patient for ongoing care management follow up and provided patient with direct contact information for care management team; Advised patient, providing education and rationale, to monitor blood pressure daily and record, calling PCP for findings outside established parameters;  Advised patient to discuss changes in  HTN or heart health with provider; Provided education on prescribed diet heart healthy/ADA diet ;  Discussed  complications of poorly controlled blood pressure such as heart disease, stroke, circulatory complications, vision complications, kidney impairment, sexual dysfunction;  Screening for signs and symptoms of depression related to chronic disease state;  Assessed social determinant of health barriers;   Symptom Management: Take medications as prescribed   Attend all scheduled provider appointments Call pharmacy for medication refills 3-7 days in advance of running out of medications Call provider office for new concerns or questions  Work with the social worker to address care coordination needs and will continue to work with the clinical team to address health care and disease management related needs call the Suicide and Crisis Lifeline: 988 call the [place] National Suicide Prevention Lifeline: 5033130859 or TTY: 6195583573 TTY [phone]) to talk to a trained counselor call 1-800-273-TALK (toll free, 24 hour hotline) if experiencing a Mental Health or [place]  check blood pressure weekly learn about high blood pressure call doctor for signs and symptoms of high blood pressure develop an action plan for high blood pressure keep all doctor appointments take medications for blood pressure exactly as prescribed report new symptoms to your doctor eat more whole grains, fruits and vegetables, lean meats and healthy fats  Follow Up Plan: Telephone follow up appointment with care management team member scheduled for: 05-01-2022 at 2 pm          The patient verbalized understanding of instructions, educational materials, and care plan provided today and DECLINED offer to receive copy of patient instructions, educational materials, and care plan.   Telephone follow up appointment with care management team member scheduled for: 05-01-2022 at 2 pm

## 2022-04-17 NOTE — Chronic Care Management (AMB) (Signed)
Chronic Care Management Provider Comprehensive Care Plan    04/17/2022 Name: Alex Green MRN: [id number] DOB: 04-Sep-1965  Referral to Chronic Care Management (CCM) services was placed by Provider:  Dr. [name]  on Date: 04-03-2022.  Chronic Condition 1: HTN Provider Assessment and Plan  Well-controlled HTN - Home BP readings       Plan:  1. Continue current BP regimen Valsartan 80mg  daily 2. Encourage improved lifestyle - low sodium diet, regular exercise 3. advised to monitor BP outside office, bring readings to next visit, if persistently >140/90 or new symptoms notify office sooner         Relevant Medications    valsartan (DIOVAN) 80 MG tablet     Expected Outcome/Goals Addressed This Visit (Provider CCM goals/Provider Assessment and plan   CCM (HYPERTENSION)  EXPECTED OUTCOME:  MONITOR,SELF- MANAGE AND REDUCE SYMPTOMS OF HYPERTENSION   Symptom Management Condition 1: Take all medications as prescribed Attend all scheduled provider appointments Call pharmacy for medication refills 3-7 days in advance of running out of medications Work with the social worker to address care coordination needs and will continue to work with the clinical team to address health care and disease management related needs call the Suicide and Crisis Lifeline: 988 call the [place] National Suicide Prevention Lifeline: 915-262-6117 or TTY: (231)810-6180 TTY 331-233-6301) to talk to a trained counselor call 1-800-273-TALK (toll free, 24 hour hotline) if experiencing a Mental Health or Behavioral Health Crisis  check blood pressure weekly learn about high blood pressure call doctor for signs and symptoms of high blood pressure keep all doctor appointments take medications for blood pressure exactly as prescribed report new symptoms to your doctor eat more whole grains, fruits and vegetables, lean meats and healthy fats  Chronic Condition 2: DM Provider Assessment and Plan Currently  managing Type 2 Diabetes   Will agree to sign off on orders for Diabetic Shoes. Will need the order form from patient / podiatry [facility]), we will call them today to check if they were able to receive the last order or if they can send [place] a new document to sign. - Patient would benefit from Diabetic Shoes due to neuropathy with callus formation and great toe deformity causing pain, diabetes control is improving on current regimen, and I am continuing to monitor and manage diabetes.   Note his shoes were attempted to be ordered months ago, they have arrived at [facility] back in July 2023, but we have not received paperwork, now patient requested, and we called podiatry and they will be faxing a form to our office today for me to sign. We will fax back and they can schedule patient to pick up shoes. 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  Patient is willing and motivated to use the power mobility device in his home to improve his quality of life.   Handwritten order given to patient and list of DME stores. He will submit and we can complete any specific order form if when received      Expected Outcome/Goals Addressed This Visit (Provider CCM goals/Provider Assessment and plan   CCM (DIABETES) EXPECTED OUTCOME: MONITOR, SELF-MANAGE AND REDUCE SYMPTOMS OF DIABETES   Symptom Management Condition 2: Take all medications as prescribed Attend all scheduled provider appointments Call provider office for new concerns or questions  call the Suicide and Crisis Lifeline: 988 call the [place] National Suicide Prevention Lifeline: 3207936578 or TTY: 480 760 7348 TTY 2188768057) to talk to a trained counselor call 1-800-273-TALK (toll  free, 24 hour hotline) if experiencing a Mental Health or [place]  check feet daily for cuts, sores or redness trim toenails straight across wash and dry feet carefully every day wear comfortable, cotton socks wear comfortable, well-fitting  shoes   Chronic Condition 3: Depression  Provider Assessment and Plan  Relevant Medications     DULoxetine (CYMBALTA) 60 MG capsule     Expected Outcome/Goals Addressed This Visit (Provider CCM goals/Provider Assessment and plan   CCM  EXPECTED OUTCOME: MONITOR, SELF-MANAGE AND REDUCE SYMPTOMS OF Depression   Symptom Management Condition 3: Take all medications as prescribed Attend all scheduled provider appointments Call provider office for new concerns or questions  Work with the social worker to address care coordination needs and will continue to work with the clinical team to address health care and disease management related needs call the Suicide and Crisis Lifeline: 988 call the [place] National Suicide Prevention Lifeline: 612 262 0976 or TTY: 416-493-6709 Alex Green [phone]) to talk to a trained counselor call 1-800-273-TALK (toll free, 24 hour hotline) if experiencing a Mental Health or [place] Crisis      Problem List Patient Active Problem List   Diagnosis Date Noted   Impairment of balance 08/16/2021   Major depressive disorder, recurrent, moderate ([place]) 06/26/2021   Venous insufficiency of both lower extremities 12/26/2020   Recurrent falls 12/26/2020   Weakness of both lower extremities 12/26/2020   Pain due to onychomycosis of toenails of both feet 11/09/2020   Hyperlipidemia associated with type 2 diabetes mellitus ([place]) 07/31/2020   Muscle weakness (generalized) 07/31/2020   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[place]) 06/24/2020   Controlled type 2 diabetes mellitus with neuropathy ([place]) 04/21/2020   Essential hypertension 04/21/2020   GAD (generalized anxiety disorder) 04/21/2020   GERD (gastroesophageal reflux disease) 04/21/2020   Tobacco abuse 04/21/2020   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 (Patient not taking: Reported on 02/21/2022), Disp: 42 capsule, Rfl: 0   clotrimazole-betamethasone (LOTRISONE) cream, Apply 2 times a day for worsening flare dry skin dermatitis of toes/feet, may re-use daily up to 1 week as needed., Disp: 30 g, Rfl: 2   fluticasone (FLONASE) 50 MCG/ACT nasal spray, PLACE 2 SPRAYS INTO BOTH NOSTRILS DAILY FOR 4-6 WEEKS THEN STOP AND USE SEASONALLY OR AS NEEDED., Disp: 48 mL, Rfl: 1   levETIRAcetam (KEPPRA) 500 MG tablet, Take 1 tablet (500 mg total) by mouth 2 (two) times daily for 7 days., Disp: 14 tablet, Rfl: 0   levETIRAcetam (KEPPRA) 500 MG tablet, levetiracetam 500 mg tablet  TAKE 1 TABLET BY MOUTH TWICE A DAY FOR 7 DAYS (Patient not taking: Reported on 10/05/2021), Disp: , Rfl:    mirtazapine (REMERON) 15 MG tablet, Take 1 tablet (15 mg total) by mouth at bedtime., Disp: 30 tablet, Rfl: 2   ondansetron (ZOFRAN-ODT) 4 MG disintegrating tablet, Take 1 tablet (4 mg total) by mouth every 8 (eight) hours as needed for nausea or vomiting., Disp: 30 tablet, Rfl: 0   rOPINIRole (REQUIP) 0.5 MG tablet, Take 1 tablet (0.5 mg total) by mouth at bedtime. May increase to 2 pills at bedtime for dose 1mg  nightly if needed., Disp: 90 tablet, Rfl: 1  zolpidem (AMBIEN CR) 12.5 MG CR tablet, Take 1 tablet (12.5 mg total) by mouth at bedtime as needed for sleep., Disp: 90 tablet, Rfl: 1  Cognitive Assessment Identity Confirmed: : Name; DOB Cognitive Status: Normal   Functional Assessment Hearing Difficulty or Deaf: no Wear Glasses or Blind: no Concentrating, Remembering or Making Decisions Difficulty (CP): no Difficulty Communicating: no Difficulty Eating/Swallowing: no Walking or Climbing Stairs Difficulty: yes Walking or Climbing Stairs: ambulation difficulty, requires equipment Mobility  Management: the patient is trying to get an electric wheel chair, asking for assistance with paperwork Dressing/Bathing Difficulty: yes Dressing/Bathing: bathing difficulty, assistance 1 person Dressing/Bathing Management: unable to do normal ADLs\IADLS, needs assistance Doing Errands Independently Difficulty (such as shopping) (CP): yes Errands Management: Unable to independently do errands, depends on other to assist him with meeting his needs Change in Functional Status Since Onset of Current Illness/Injury: no   Caregiver Assessment  Primary Source of Support/Comfort: significant other Name of Support/Comfort Primary Source: [facility] the patients girlfriend People in Home: significant other Name(s) of People in Home: [place] if Needed: significant other Family Caregiver Names: Hope Primary Roles/Responsibilities: disabled Concerns About Impact on Relationships: concerned about inablility to get around as good as he did, needs an electric wheel chair   Planned Interventions  Take medications as prescribed   Attend all scheduled provider appointments Call provider office for new concerns or questions  Work with the social worker to address care coordination needs and will continue to work with the clinical team to address health care and disease management related needs call the Suicide and Crisis Lifeline: 988 call the [place] National Suicide Prevention Lifeline: (940)747-2803 or TTY: 669-764-5243 TTY 867-062-2824) to talk to a trained counselor call 1-800-273-TALK (toll free, 24 hour hotline) if experiencing a Mental Health or [place]  Provided education to patient about basic DM disease process; Reviewed medications with patient and discussed importance of medication adherence;        Reviewed prescribed diet with patient heart healthy/ADA; Counseled on importance of regular laboratory monitoring as prescribed;        Discussed plans with patient  for ongoing care management follow up and provided patient with direct contact information for care management team;      Provided patient with written educational materials related to hypo and hyperglycemia and importance of correct treatment;       call provider for findings outside established parameters;       Referral made to social work team for assistance with support for in home care and needs. The LCSW has worked with the LCSW for assistance and needs related to in home care. 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 Education on extermination companies. The patient states the problem is 80% controlled at this time. Agencies will not come into the home until the patient has this problem taken care of ;      Review of patient status, including review of consultants reports, relevant laboratory and other test results, and medications completed;       Advised patient to discuss changes in DM health and well being with provider;      Screening for signs and symptoms of depression related to chronic disease state;        Assessed social determinant of health barriers;        The patient called and left a message asking the RNCM to call back. The patient is in  need of an electric wheelchair and cannot find a DME company. Has found a company called [employer] that states they are an approved DME company. Education provided to the patient that he needs to call his insurance and see if they will approve this. Explained the pcp could send paperwork but we would need a place to fax the orders. Education and support given. Will continue to monitor. Evaluation of current treatment plan related to hypertension self management and patient's adherence to plan as established by provider;   Provided education to patient re: stroke prevention, s/s of heart attack and stroke; Reviewed prescribed diet heart healthy/ADA Reviewed medications with patient and discussed  importance of compliance;  Discussed plans with patient for ongoing care management follow up and provided patient with direct contact information for care management team; Advised patient, providing education and rationale, to monitor blood pressure daily and record, calling PCP for findings outside established parameters;  Advised patient to discuss changes in  HTN or heart health with provider; Provided education on prescribed diet heart healthy/ADA diet ;  Discussed complications of poorly controlled blood pressure such as heart disease, stroke, circulatory complications, vision complications, kidney impairment, sexual dysfunction;  Screening for signs and symptoms of depression related to chronic disease state;  Assessed social determinant of health barriers;   Interaction and coordination with outside resources, practitioners, and providers See CCM Referral  Care Plan: Patient declined

## 2022-04-22 ENCOUNTER — Ambulatory Visit: Payer: Self-pay

## 2022-04-22 DIAGNOSIS — E1140 Type 2 diabetes mellitus with diabetic neuropathy, unspecified: Secondary | ICD-10-CM

## 2022-04-22 DIAGNOSIS — F331 Major depressive disorder, recurrent, moderate: Secondary | ICD-10-CM

## 2022-04-22 NOTE — Chronic Care Management (AMB) (Signed)
Chronic Care Management   CCM RN Visit Note  04/22/2022 Name: Alex Green MRN: [id number] DOB: Sep 09, 1965  Subjective: Alex Green is a 56 y.o. year old male who is a primary care patient of Olin Hauser, DO. The patient was referred to the Chronic Care Management team for assistance with care management needs subsequent to provider initiation of CCM services and plan of care.    Today's Visit:  Engaged with patient by telephone for follow up visit.     SDOH Interventions Today    Flowsheet Row Most Recent Value  SDOH Interventions   Physical Activity Interventions Other (Comments)  [the patient is immobile and needs an electric wheelchair for his mobility issues, resources pending]         Goals Addressed             This Visit's Progress    CCM Expected Outcome:  Monitor, Self-Manage and Reduce Symptoms of  depression       Current Barriers:  Knowledge Deficits related to how to effectively manage depression, maintain independence and utilization of resources to help patient meet his health and well being needs  Care Coordination needs related to help in the home, obtaining DME, effectively getting the help he needs for mental health and well being stability in a patient with depression  Chronic Disease Management support and education needs related to effective management of depression  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  Planned Interventions: Evaluation of current treatment plan related to depression and patient's adherence to plan as established by provider Advised patient to call the office for changes in mood, anxiety, depression, or mental health needs Provided education to patient re: calling insurance company for assistance in where to get paperwork sent for new DME: electric wheelchair. New information provided about the way to get needed information so paperwork can be sent. He has been very frustrated  with the process. Reflective listening and support given. Reviewed medications with patient and discussed compliance Collaborated with pcp regarding the patients need for new DME to help with mobility progression. Will work with the pcp to see what can be done to assist with the needs of the patient.  Discussed plans with patient for ongoing care management follow up and provided patient with direct contact information for care management team Advised patient to discuss changes in depression, anxiety, mood, and mental health needs, also changes in his environment that increase his risk of exacerbation of his depression  with provider Screening for signs and symptoms of depression related to chronic disease state  Assessed social determinant of health barriers  Symptom Management: Take medications as prescribed   Attend all scheduled provider appointments Call provider office for new concerns or questions  Work with the social worker to address care coordination needs and will continue to work with the clinical team to address health care and disease management related needs call the Suicide and Crisis Lifeline: 988 call the [place] National Suicide Prevention Lifeline: 267-755-1955 or TTY: (747)129-8760 TTY (343)386-9394) to talk to a trained counselor call 1-800-273-TALK (toll free, 24 hour hotline) if experiencing a Mental Health or [place]   Follow Up Plan: Telephone follow up appointment with care management team member scheduled for: 05-01-2022 at 2 pm       CCM Expected Outcome:  Monitor, Self-Manage and Reduce Symptoms of Diabetes, Neuropathy       Current Barriers:  Knowledge Deficits related to the need to effectively manage  DM and the importance of proper nutrition and hydration in patient with DM Care Coordination needs related to how to obtain resources and help in the home in a patient with DM Chronic Disease Management support and education needs related to  effective management of DM 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 Non-adherence to scheduled provider appointments Non-adherence to prescribed medication regimen The need for DME new electric wheelchair to aide in mobility needs   Planned Interventions: Provided education to patient about basic DM disease process; Reviewed medications with patient and discussed importance of medication adherence;        Reviewed prescribed diet with patient heart healthy/ADA; Counseled on importance of regular laboratory monitoring as prescribed;        Discussed plans with patient for ongoing care management follow up and provided patient with direct contact information for care management team;      Provided patient with written educational materials related to hypo and hyperglycemia and importance of correct treatment;       call provider for findings outside established parameters;       Referral made to social work team for assistance with support for in home care and needs. The LCSW has worked with the LCSW for assistance and needs related to in home care. 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 Education on extermination companies. The patient states the problem is 80% controlled at this time. Agencies will not come into the home until the patient has this problem taken care of ;      Review of patient status, including review of consultants reports, relevant laboratory and other test results, and medications completed;       Advised patient to discuss changes in DM health and well being with provider;      Screening for signs and symptoms of depression related to chronic disease state;        Assessed social determinant of health barriers;        The patient called and left a message asking the RNCM to call back. The patient is in need of an electric wheelchair and cannot find a DME company.The patient has called the [facility] and  left messages asking for assistance with electric wheelchair. RNCM has called 3 DME providers and none and able to assist with the patients needs. [employer], [employer] supply company and [employer] all do not provide power scooters or chairs with [employer]. Advised the patient to call his insurance company to see if they can direct him on who to call. The patient has found a company in [place] that say they can help him. He does not have a fax number. Advised the patient to call and get needed information. Also the patient has found a company in [place] but they state that it will take 120 days to get all information needed and PT evaluation. Education provided. The patient is frustrated with the process. Encouraged the patient to continue working on the best way to get his electric wheelchair. Will collaborate with the pcp and the team for additional help and assistance. A call to the [employer] in [place] and inquired about an Mining engineer wheelchair.  Symptom Management: Take medications as prescribed   Call pharmacy for medication refills 3-7 days in advance of running out of medications Call provider office for new concerns or questions  Work with the social worker to address care coordination needs and will continue  to work with the clinical team to address health care and disease management related needs call the Suicide and Crisis Lifeline: 988 call the [place] National Suicide Prevention Lifeline: (620)123-0246 or TTY: (437)855-3275 TTY (971) 246-4551) to talk to a trained counselor call 1-800-273-TALK (toll free, 24 hour hotline) if experiencing a Mental Health or [place]  check feet daily for cuts, sores or redness trim toenails straight across wash and dry feet carefully every day wear comfortable, cotton socks wear comfortable, well-fitting shoes  Follow Up Plan: Telephone follow up appointment with care management team member scheduled for:  05-01-2022 at 2 pm          Plan:Telephone follow up appointment with care management team member scheduled for:  05-01-2022  Noreene Larsson RN, MSN, CCM RN Care Manager  Chronic Care Management Direct Number: [phone]

## 2022-04-22 NOTE — Patient Instructions (Signed)
Please call the care guide team at [phone] if you need to cancel or reschedule your appointment.   If you are experiencing a Mental Health or Behavioral Health Crisis or need someone to talk to, please call the Suicide and Crisis Lifeline: 988 call the [place] National Suicide Prevention Lifeline: 908 772 9056 or TTY: 419-424-7419 TTY (959)825-3632) to talk to a trained counselor call 1-800-273-TALK (toll free, 24 hour hotline)   Following is a copy of your full provider care plan:   Goals Addressed             This Visit's Progress    CCM Expected Outcome:  Monitor, Self-Manage and Reduce Symptoms of  depression       Current Barriers:  Knowledge Deficits related to how to effectively manage depression, maintain independence and utilization of resources to help patient meet his health and well being needs  Care Coordination needs related to help in the home, obtaining DME, effectively getting the help he needs for mental health and well being stability in a patient with depression  Chronic Disease Management support and education needs related to effective management of depression  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  Planned Interventions: Evaluation of current treatment plan related to depression and patient's adherence to plan as established by provider Advised patient to call the office for changes in mood, anxiety, depression, or mental health needs Provided education to patient re: calling insurance company for assistance in where to get paperwork sent for new DME: electric wheelchair. New information provided about the way to get needed information so paperwork can be sent. He has been very frustrated with the process. Reflective listening and support given. Reviewed medications with patient and discussed compliance Collaborated with pcp regarding the patients need for new DME to help with mobility progression. Will work with the pcp to  see what can be done to assist with the needs of the patient.  Discussed plans with patient for ongoing care management follow up and provided patient with direct contact information for care management team Advised patient to discuss changes in depression, anxiety, mood, and mental health needs, also changes in his environment that increase his risk of exacerbation of his depression  with provider Screening for signs and symptoms of depression related to chronic disease state  Assessed social determinant of health barriers  Symptom Management: Take medications as prescribed   Attend all scheduled provider appointments Call provider office for new concerns or questions  Work with the social worker to address care coordination needs and will continue to work with the clinical team to address health care and disease management related needs call the Suicide and Crisis Lifeline: 988 call the [place] National Suicide Prevention Lifeline: (317)008-4184 or TTY: 616-383-7150 TTY (985) 368-8859) to talk to a trained counselor call 1-800-273-TALK (toll free, 24 hour hotline) if experiencing a Mental Health or Behavioral Health Crisis   Follow Up Plan: Telephone follow up appointment with care management team member scheduled for: 05-01-2022 at 2 pm       CCM Expected Outcome:  Monitor, Self-Manage and Reduce Symptoms of Diabetes, Neuropathy       Current Barriers:  Knowledge Deficits related to the need to effectively manage DM and the importance of proper nutrition and hydration in patient with DM Care Coordination needs related to how to obtain resources and help in the home in a patient with DM Chronic Disease Management support and education needs related to effective management of DM 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 Non-adherence to scheduled provider appointments Non-adherence to prescribed medication regimen The need for DME new electric wheelchair to  aide in mobility needs   Planned Interventions: Provided education to patient about basic DM disease process; Reviewed medications with patient and discussed importance of medication adherence;        Reviewed prescribed diet with patient heart healthy/ADA; Counseled on importance of regular laboratory monitoring as prescribed;        Discussed plans with patient for ongoing care management follow up and provided patient with direct contact information for care management team;      Provided patient with written educational materials related to hypo and hyperglycemia and importance of correct treatment;       call provider for findings outside established parameters;       Referral made to social work team for assistance with support for in home care and needs. The LCSW has worked with the LCSW for assistance and needs related to in home care. 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 Education on extermination companies. The patient states the problem is 80% controlled at this time. Agencies will not come into the home until the patient has this problem taken care of ;      Review of patient status, including review of consultants reports, relevant laboratory and other test results, and medications completed;       Advised patient to discuss changes in DM health and well being with provider;      Screening for signs and symptoms of depression related to chronic disease state;        Assessed social determinant of health barriers;        The patient called and left a message asking the RNCM to call back. The patient is in need of an electric wheelchair and cannot find a DME company.The patient has called the [facility] and left messages asking for assistance with electric wheelchair. RNCM has called 3 DME providers and none and able to assist with the patients needs. [employer], [employer] supply company and [employer] all do not provide power  scooters or chairs with [employer]. Advised the patient to call his insurance company to see if they can direct him on who to call. The patient has found a company in [place] that say they can help him. He does not have a fax number. Advised the patient to call and get needed information. Also the patient has found a company in [place] but they state that it will take 120 days to get all information needed and PT evaluation. Education provided. The patient is frustrated with the process. Encouraged the patient to continue working on the best way to get his electric wheelchair. Will collaborate with the pcp and the team for additional help and assistance. A call to the [employer] in [place] and inquired about an Mining engineer wheelchair.  Symptom Management: Take medications as prescribed   Call pharmacy for medication refills 3-7 days in advance of running out of medications Call provider office for new concerns or questions  Work with the social worker to address care coordination needs and will continue to work with the clinical team to address health care and disease management related needs call the Suicide and Crisis Lifeline: 988 call the [place] National Suicide Prevention Lifeline: (770) 154-4524 or TTY: (405) 084-9278 TTY (317) 310-9415) to talk to a trained counselor call 1-800-273-TALK (toll free, 24 hour hotline) if  experiencing a Mental Health or [place]  check feet daily for cuts, sores or redness trim toenails straight across wash and dry feet carefully every day wear comfortable, cotton socks wear comfortable, well-fitting shoes  Follow Up Plan: Telephone follow up appointment with care management team member scheduled for: 05-01-2022 at 2 pm          The patient verbalized understanding of instructions, educational materials, and care plan provided today and DECLINED offer to receive copy of patient instructions, educational materials, and care plan.    Telephone follow up appointment with care management team member scheduled for: 05-01-2022

## 2022-04-26 NOTE — Telephone Encounter (Signed)
done

## 2022-04-27 ENCOUNTER — Other Ambulatory Visit: Payer: Self-pay | Admitting: Family Medicine

## 2022-04-27 DIAGNOSIS — R630 Anorexia: Secondary | ICD-10-CM

## 2022-04-27 DIAGNOSIS — G4700 Insomnia, unspecified: Secondary | ICD-10-CM

## 2022-04-27 DIAGNOSIS — E639 Nutritional deficiency, unspecified: Secondary | ICD-10-CM

## 2022-04-29 ENCOUNTER — Telehealth: Payer: Self-pay | Admitting: Family Medicine

## 2022-04-29 ENCOUNTER — Ambulatory Visit: Payer: Self-pay

## 2022-04-29 DIAGNOSIS — F331 Major depressive disorder, recurrent, moderate: Secondary | ICD-10-CM

## 2022-04-29 DIAGNOSIS — E1140 Type 2 diabetes mellitus with diabetic neuropathy, unspecified: Secondary | ICD-10-CM

## 2022-04-29 DIAGNOSIS — R29898 Other symptoms and signs involving the musculoskeletal system: Secondary | ICD-10-CM

## 2022-04-29 DIAGNOSIS — R296 Repeated falls: Secondary | ICD-10-CM

## 2022-04-29 NOTE — Chronic Care Management (AMB) (Signed)
Chronic Care Management   CCM RN Visit Note  04/29/2022 Name: Alex Green MRN: [id number] DOB: 12-29-1965  Subjective: Alex Green is a 56 y.o. year old male who is a primary care patient of [name], DO. The patient was referred to the Chronic Care Management team for assistance with care management needs subsequent to provider initiation of CCM services and plan of care.    Today's Visit:  Engaged with patient by telephone for follow up visit.        Goals Addressed             This Visit's Progress    CCM Expected Outcome:  Monitor, Self-Manage and Reduce Symptoms of  depression       Current Barriers:  Knowledge Deficits related to how to effectively manage depression, maintain independence and utilization of resources to help patient meet his health and well being needs  Care Coordination needs related to help in the home, obtaining DME, effectively getting the help he needs for mental health and well being stability in a patient with depression  Chronic Disease Management support and education needs related to effective management of depression  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  Planned Interventions: Evaluation of current treatment plan related to depression and patient's adherence to plan as established by provider Advised patient to call the office for changes in mood, anxiety, depression, or mental health needs Provided education to patient re: calling insurance company for assistance in where to get paperwork sent for new DME: electric wheelchair. New information provided about the way to get needed information so paperwork can be sent. He has been very frustrated with the process. Reflective listening and support given. Reviewed medications with patient and discussed compliance Collaborated with pcp regarding the patients need for new DME to help with mobility progression. Will work with the pcp to see  what can be done to assist with the needs of the patient. The patient now has decided to pay out of pocket and get a chair and then get reimbursed for the chair. Education that information had been sent to the company on behalf of the patient. The patient verbalized understanding. The patient has called the office also and discussed his needs about home health. Extensive education given.  Discussed plans with patient for ongoing care management follow up and provided patient with direct contact information for care management team Advised patient to discuss changes in depression, anxiety, mood, and mental health needs, also changes in his environment that increase his risk of exacerbation of his depression  with provider Screening for signs and symptoms of depression related to chronic disease state  Assessed social determinant of health barriers  Symptom Management: Take medications as prescribed   Attend all scheduled provider appointments Call provider office for new concerns or questions  Work with the social worker to address care coordination needs and will continue to work with the clinical team to address health care and disease management related needs call the Suicide and Crisis Lifeline: 988 call the [place] National Suicide Prevention Lifeline: 508-100-7516 or TTY: (754)831-0482 TTY 928-382-2265) to talk to a trained counselor call 1-800-273-TALK (toll free, 24 hour hotline) if experiencing a Mental Health or Behavioral Health Crisis   Follow Up Plan: Telephone follow up appointment with care management team member scheduled for: 05-01-2022 at 2 pm       CCM Expected Outcome:  Monitor, Self-Manage and Reduce Symptoms of Diabetes, Neuropathy  Current Barriers:  Knowledge Deficits related to the need to effectively manage DM and the importance of proper nutrition and hydration in patient with DM Care Coordination needs related to how to obtain resources and help in the home in a  patient with DM Chronic Disease Management support and education needs related to effective management of DM 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 Non-adherence to scheduled provider appointments Non-adherence to prescribed medication regimen The need for DME new electric wheelchair to aide in mobility needs   Planned Interventions: Provided education to patient about basic DM disease process; Reviewed medications with patient and discussed importance of medication adherence;        Reviewed prescribed diet with patient heart healthy/ADA; Counseled on importance of regular laboratory monitoring as prescribed;        Discussed plans with patient for ongoing care management follow up and provided patient with direct contact information for care management team;      Provided patient with written educational materials related to hypo and hyperglycemia and importance of correct treatment;       call provider for findings outside established parameters;       Referral made to social work team for assistance with support for in home care and needs. The LCSW has worked with the LCSW for assistance and needs related to in home care. 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 Education on extermination companies. The patient states the problem is 80% controlled at this time. Agencies will not come into the home until the patient has this problem taken care of ;      Review of patient status, including review of consultants reports, relevant laboratory and other test results, and medications completed;       Advised patient to discuss changes in DM health and well being with provider;      Screening for signs and symptoms of depression related to chronic disease state;        Assessed social determinant of health barriers;        The patient called and left a message asking the RNCM to call back. The patient had called and left  several messages over the weekend asking for a call back from the [facility] He has also called the office and left information on a home health agency to send home health orders to. A call made back to the patient and informed the patient that the home health order could be made but the home health agency would not provide services if the patient did not have a safe home free of mice and roaches. The patient states this has been taken care off. Review of the messages the patient left the [facility] and the patient states that  [facility] is no longer in business that the home health agency he wants to come out is [facility] located on [address]. [facility] [address], [address] [place], [place], phone: [phone]. Will collaborate with the pcp and let the pcp know.   Symptom Management: Take medications as prescribed   Call pharmacy for medication refills 3-7 days in advance of running out of medications Call provider office for new concerns or questions  Work with the social worker to address care coordination needs and will continue to work with the clinical team to address health care and disease management related needs call the Suicide and Crisis Lifeline: 988 call the  [place] National Suicide Prevention Lifeline: (626)610-6355 or TTY: (435)674-4237 TTY 216-615-7347) to talk to a trained counselor call 1-800-273-TALK (toll free, 24 hour hotline) if experiencing a Mental Health or Behavioral Health Crisis  check feet daily for cuts, sores or redness trim toenails straight across wash and dry feet carefully every day wear comfortable, cotton socks wear comfortable, well-fitting shoes  Follow Up Plan: Telephone follow up appointment with care management team member scheduled for: 05-01-2022 at 2 pm          Plan:Telephone follow up appointment with care management team member scheduled for:  06-18-2022 at 230 pm  Alto Denver RN, MSN, CCM RN Care Manager  Chronic  Care Management Direct Number: [phone]

## 2022-04-29 NOTE — Telephone Encounter (Signed)
HH Orders for RN Phys Therapy and Home Aide sent to  [facility] Home health care service in [place], [place] Located in: [facility] Address: [address], [place], [place] [address] Phone: [phone]  Saralyn Pilar, DO [facility] 04/29/2022, 3:10 PM

## 2022-04-29 NOTE — Patient Instructions (Signed)
Please call the care guide team at [phone] if you need to cancel or reschedule your appointment.   If you are experiencing a Mental Health or Behavioral Health Crisis or need someone to talk to, please call the Suicide and Crisis Lifeline: 988 call the [place] National Suicide Prevention Lifeline: 2764945794 or TTY: (906)352-6335 TTY 479-493-1367) to talk to a trained counselor call 1-800-273-TALK (toll free, 24 hour hotline)   Following is a copy of your full provider care plan:   Goals Addressed             This Visit's Progress    CCM Expected Outcome:  Monitor, Self-Manage and Reduce Symptoms of  depression       Current Barriers:  Knowledge Deficits related to how to effectively manage depression, maintain independence and utilization of resources to help patient meet his health and well being needs  Care Coordination needs related to help in the home, obtaining DME, effectively getting the help he needs for mental health and well being stability in a patient with depression  Chronic Disease Management support and education needs related to effective management of depression  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  Planned Interventions: Evaluation of current treatment plan related to depression and patient's adherence to plan as established by provider Advised patient to call the office for changes in mood, anxiety, depression, or mental health needs Provided education to patient re: calling insurance company for assistance in where to get paperwork sent for new DME: electric wheelchair. New information provided about the way to get needed information so paperwork can be sent. He has been very frustrated with the process. Reflective listening and support given. Reviewed medications with patient and discussed compliance Collaborated with pcp regarding the patients need for new DME to help with mobility progression. Will work with the pcp to  see what can be done to assist with the needs of the patient. The patient now has decided to pay out of pocket and get a chair and then get reimbursed for the chair. Education that information had been sent to the company on behalf of the patient. The patient verbalized understanding. The patient has called the office also and discussed his needs about home health. Extensive education given.  Discussed plans with patient for ongoing care management follow up and provided patient with direct contact information for care management team Advised patient to discuss changes in depression, anxiety, mood, and mental health needs, also changes in his environment that increase his risk of exacerbation of his depression  with provider Screening for signs and symptoms of depression related to chronic disease state  Assessed social determinant of health barriers  Symptom Management: Take medications as prescribed   Attend all scheduled provider appointments Call provider office for new concerns or questions  Work with the social worker to address care coordination needs and will continue to work with the clinical team to address health care and disease management related needs call the Suicide and Crisis Lifeline: 988 call the [place] National Suicide Prevention Lifeline: 617-790-9080 or TTY: 757-849-3565 TTY 703-503-8808) to talk to a trained counselor call 1-800-273-TALK (toll free, 24 hour hotline) if experiencing a Mental Health or Behavioral Health Crisis   Follow Up Plan: Telephone follow up appointment with care management team member scheduled for: 05-01-2022 at 2 pm       CCM Expected Outcome:  Monitor, Self-Manage and Reduce Symptoms of Diabetes, Neuropathy       Current Barriers:  Knowledge Deficits  related to the need to effectively manage DM and the importance of proper nutrition and hydration in patient with DM Care Coordination needs related to how to obtain resources and help in the home in  a patient with DM Chronic Disease Management support and education needs related to effective management of DM 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 Non-adherence to scheduled provider appointments Non-adherence to prescribed medication regimen The need for DME new electric wheelchair to aide in mobility needs   Planned Interventions: Provided education to patient about basic DM disease process; Reviewed medications with patient and discussed importance of medication adherence;        Reviewed prescribed diet with patient heart healthy/ADA; Counseled on importance of regular laboratory monitoring as prescribed;        Discussed plans with patient for ongoing care management follow up and provided patient with direct contact information for care management team;      Provided patient with written educational materials related to hypo and hyperglycemia and importance of correct treatment;       call provider for findings outside established parameters;       Referral made to social work team for assistance with support for in home care and needs. The LCSW has worked with the LCSW for assistance and needs related to in home care. 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 Education on extermination companies. The patient states the problem is 80% controlled at this time. Agencies will not come into the home until the patient has this problem taken care of ;      Review of patient status, including review of consultants reports, relevant laboratory and other test results, and medications completed;       Advised patient to discuss changes in DM health and well being with provider;      Screening for signs and symptoms of depression related to chronic disease state;        Assessed social determinant of health barriers;        The patient called and left a message asking the RNCM to call back. The patient had called and  left several messages over the weekend asking for a call back from the [facility]. He has also called the office and left information on a home health agency to send home health orders to. A call made back to the patient and informed the patient that the home health order could be made but the home health agency would not provide services if the patient did not have a safe home free of mice and roaches. The patient states this has been taken care off. Review of the messages the patient left the [facility] and the patient states that  [facility] is no longer in business that the home health agency he [facility] come out [facility] located on [address]. [facility] [address] [place], [place], phone: [phone]. Will collaborate with the pcp and let the pcp know.   Symptom Management: Take medications as prescribed   Call pharmacy for medication refills 3-7 days in advance of running out of medications Call provider office for new concerns or questions  Work with the social worker to address care coordination needs and will continue to work with the clinical team to address health care and disease management related needs call the Suicide and Crisis Lifeline: 988 call the [place] National Suicide Prevention Lifeline:  320-507-1706 or TTY: (918)233-2601 TTY 562 082 0796) to talk to a trained counselor call 1-800-273-TALK (toll free, 24 hour hotline) if experiencing a Mental Health or Behavioral Health Crisis  check feet daily for cuts, sores or redness trim toenails straight across wash and dry feet carefully every day wear comfortable, cotton socks wear comfortable, well-fitting shoes  Follow Up Plan: Telephone follow up appointment with care management team member scheduled for: 05-01-2022 at 2 pm          The patient verbalized understanding of instructions, educational materials, and care plan provided today and DECLINED offer to receive copy of patient  instructions, educational materials, and care plan.   Telephone follow up appointment with care management team member scheduled for: 06-18-2022 at 230 pm

## 2022-04-29 NOTE — Telephone Encounter (Signed)
Pt requesting home heath order sent to [facility]  Phone [phone]  Pt requesting a cb

## 2022-04-29 NOTE — Telephone Encounter (Signed)
Pt called stating that he needs home health services for bedside assistance. He needs someone to help him empty his bedside commode bc he is going through sheets and clothes faster than the washing machine can keep up with.   He does not drive  Requesting a call back [phone]

## 2022-04-29 NOTE — Telephone Encounter (Signed)
Requested medications are due for refill today.  no  Requested medications are on the active medications list.  yes  Last refill. 04/03/2022#30 2 rf  Future visit scheduled.   no  Notes to clinic.    Pharmacy comment: REQUEST FOR 90 DAYS PRESCRIPTION. DX Code Needed.      Requested Prescriptions  Pending Prescriptions Disp Refills   mirtazapine (REMERON) 15 MG tablet [Pharmacy Med Name: MIRTAZAPINE 15 MG TABLET] 90 tablet 1    Sig: TAKE 1 TABLET BY MOUTH EVERYDAY AT BEDTIME     Psychiatry: Antidepressants - mirtazapine Passed - 04/27/2022  2:31 PM      Passed - Completed PHQ-2 or PHQ-9 in the last 360 days      Passed - Valid encounter within last 6 months    Recent Outpatient Visits           1 month ago Weakness of both lower extremities   [facility], [name], DO   3 months ago Failure to thrive in adult   [facility] [place], [name], DO   3 months ago Controlled type 2 diabetes mellitus with neuropathy [facility])   [facility] [name], DO   3 months ago Controlled type 2 diabetes mellitus with neuropathy [facility])   [facility] [name], DO   10 months ago Controlled type 2 diabetes mellitus with neuropathy [facility])   [facility] [place], [name], DO

## 2022-04-30 NOTE — Telephone Encounter (Signed)
Pt was called back as requested letting him know that a HH order was sent to the requested facility. Pt verbalized understanding and nothing further is needed.

## 2022-05-01 ENCOUNTER — Telehealth: Payer: Medicare Other

## 2022-05-01 NOTE — Telephone Encounter (Signed)
Pennie Rushing from [facility] stated that the patient reached out to them regarding his home health orders. Pennie Rushing advised me the orders are supposed to go to [facility], not [facility].  Pennie Rushing stated he confirmed with pt and agreed to have them sent to [facility]. Pennie Rushing believes that the pt was confused as he had made multiple phone calls and had multiple phone numbers .  Please fax order to [facility] per [place] from [place] Fax - [phone]

## 2022-05-01 NOTE — Telephone Encounter (Signed)
Alex Green with [employer] reports that Alex Green declined the order due to staffing so he would like to ask if the clinical staff could recommend a home health care agency that accepts the patient's insurance. Cb# [phone]

## 2022-05-04 ENCOUNTER — Other Ambulatory Visit: Payer: Self-pay | Admitting: Family Medicine

## 2022-05-04 DIAGNOSIS — G2581 Restless legs syndrome: Secondary | ICD-10-CM

## 2022-05-06 NOTE — Telephone Encounter (Signed)
Requested Prescriptions  Pending Prescriptions Disp Refills   rOPINIRole (REQUIP) 0.5 MG tablet [Pharmacy Med Name: ROPINIROLE HCL 0.5 MG TABLET] 180 tablet 1    Sig: TAKE 1 TAB BY MOUTH AT BEDTIME. MAY INCREASE TO 2 PILLS AT BEDTIME FOR DOSE 1MG  NIGHTLY IF NEEDED.     Neurology:  Parkinsonian Agents Failed - 05/04/2022  2:27 AM      Failed - Last BP in normal range    BP Readings from Last 1 Encounters:  03/27/22 (!) 165/95         Passed - Last Heart Rate in normal range    Pulse Readings from Last 1 Encounters:  03/27/22 (!) 102         Passed - Valid encounter within last 12 months    Recent Outpatient Visits           1 month ago Weakness of both lower extremities   [facility], [place], DO   3 months ago Failure to thrive in adult   [facility] [place], [place], DO   3 months ago Controlled type 2 diabetes mellitus with neuropathy [facility])   [facility], DO   4 months ago Controlled type 2 diabetes mellitus with neuropathy [facility])   [facility], DO   10 months ago Controlled type 2 diabetes mellitus with neuropathy [facility])   [facility], DO

## 2022-05-13 ENCOUNTER — Telehealth: Payer: Self-pay | Admitting: Family Medicine

## 2022-05-13 NOTE — Telephone Encounter (Signed)
Home Health Verbal Orders - Caller/Agency: Maureen Ralphs PT from [place] Number: [phone] Requesting OT/PT/Skilled Nursing/Social Work/Speech Therapy: PT  Frequency:   2w2 1w2

## 2022-05-13 NOTE — Telephone Encounter (Signed)
Okay to proceed with verbal orders  Saralyn Pilar, DO [facility] 05/13/2022, 2:55 PM

## 2022-05-15 ENCOUNTER — Telehealth: Payer: Self-pay

## 2022-05-15 NOTE — Telephone Encounter (Signed)
Cesar is aware.  

## 2022-05-15 NOTE — Telephone Encounter (Signed)
Copied from CRM [id number]. Topic: General - Other >> May 15, 2022  8:23 AM Clide Dales wrote: Maureen Ralphs with [facility] states the patient had a fall on 11/25 and has a small scrape on right knee.

## 2022-05-16 DIAGNOSIS — E1140 Type 2 diabetes mellitus with diabetic neuropathy, unspecified: Secondary | ICD-10-CM

## 2022-05-16 DIAGNOSIS — F32A Depression, unspecified: Secondary | ICD-10-CM

## 2022-06-02 ENCOUNTER — Other Ambulatory Visit: Payer: Self-pay | Admitting: Family Medicine

## 2022-06-02 DIAGNOSIS — R110 Nausea: Secondary | ICD-10-CM

## 2022-06-03 NOTE — Telephone Encounter (Signed)
Requested medication (s) are due for refill today: yes  Requested medication (s) are on the active medication list: yes  Last refill:  01/08/22 #30/0  Future visit scheduled: no  Notes to clinic:  Unable to refill per protocol, cannot delegate.    Requested Prescriptions  Pending Prescriptions Disp Refills   ondansetron (ZOFRAN-ODT) 4 MG disintegrating tablet [Pharmacy Med Name: ONDANSETRON ODT 4 MG TABLET] 30 tablet 0    Sig: TAKE 1 TABLET BY MOUTH EVERY 8 HOURS AS NEEDED FOR NAUSEA AND VOMITING     Not Delegated - Gastroenterology: Antiemetics - ondansetron Failed - 06/02/2022  1:30 PM      Failed - This refill cannot be delegated      Passed - AST in normal range and within 360 days    AST  Date Value Ref Range Status  01/16/2022 26 15 - 41 U/L Final         Passed - ALT in normal range and within 360 days    ALT  Date Value Ref Range Status  01/16/2022 17 0 - 44 U/L Final         Passed - Valid encounter within last 6 months    Recent Outpatient Visits           2 months ago Weakness of both lower extremities   [facility] [place], [name], DO   4 months ago Failure to thrive in adult   [facility] [place], [name], DO   4 months ago Controlled type 2 diabetes mellitus with neuropathy [facility])   [facility] [name], DO   5 months ago Controlled type 2 diabetes mellitus with neuropathy [facility])   [facility] [name], DO   11 months ago Controlled type 2 diabetes mellitus with neuropathy [facility])   [facility] [place], [name], DO

## 2022-06-05 ENCOUNTER — Telehealth: Payer: Self-pay | Admitting: Family Medicine

## 2022-06-05 NOTE — Telephone Encounter (Signed)
Home Health Verbal Orders - Caller/Agency: [facility]  Callback Number: [phone] Requesting extend PT 1 wk 4 Requesting aide for help w/ bathing

## 2022-06-05 NOTE — Telephone Encounter (Signed)
Proceed w/ verbal orders  Saralyn Pilar, DO [facility] 06/05/2022, 1:47 PM

## 2022-06-06 NOTE — Telephone Encounter (Signed)
Alex Green has been notified of orders to proceed.

## 2022-06-12 ENCOUNTER — Ambulatory Visit: Payer: Self-pay | Admitting: *Deleted

## 2022-06-12 DIAGNOSIS — G2581 Restless legs syndrome: Secondary | ICD-10-CM

## 2022-06-12 NOTE — Telephone Encounter (Signed)
Summary: Increase dosage for jumping legs   The patient called in stating he was hoping his provider would increase the dosage on his rOPINIRole (REQUIP) 0.5 MG tablet. He says his legs are jumping even during the day. He has been taking 2  a day at night but sometimes early evening it bothers him as well. Please assist patient further

## 2022-06-13 MED ORDER — ROPINIROLE HCL 1 MG PO TABS: 1.0000 mg | ORAL_TABLET | Freq: Two times a day (BID) | ORAL | 1 refills | Status: DC

## 2022-06-13 NOTE — Telephone Encounter (Signed)
The pt was notified of the recommendation. He verbalize understanding, no questions or concerns.

## 2022-06-13 NOTE — Telephone Encounter (Signed)
Please notify patient of dose change  Dose increase Ropinirole to 1mg  tab take one twice a day. He can skip daytime dose if it is not bothering him during day. He should take 1mg  nightly to help with the symptoms. If need can do 2 at night instead of the daytime dose, max is 2 pills in 24 hours.  , DO [facility] 06/13/2022, 11:57 AM

## 2022-06-13 NOTE — Addendum Note (Signed)
Addended by: [name] on: 06/13/2022 11:57 AM   Modules accepted: Orders

## 2022-06-18 ENCOUNTER — Telehealth: Payer: Medicare Other

## 2022-06-18 ENCOUNTER — Ambulatory Visit (INDEPENDENT_AMBULATORY_CARE_PROVIDER_SITE_OTHER): Payer: Medicare Other

## 2022-06-18 DIAGNOSIS — E1140 Type 2 diabetes mellitus with diabetic neuropathy, unspecified: Secondary | ICD-10-CM

## 2022-06-18 DIAGNOSIS — I10 Essential (primary) hypertension: Secondary | ICD-10-CM

## 2022-06-18 DIAGNOSIS — F331 Major depressive disorder, recurrent, moderate: Secondary | ICD-10-CM

## 2022-06-18 NOTE — Plan of Care (Signed)
Chronic Care Management Provider Comprehensive Care Plan    06/18/2022 Name: Alex Green MRN: [id number] DOB: 1965-10-30  Referral to Chronic Care Management (CCM) services was placed by Provider:  Dr. [name] on Date: 04-03-2022.  Chronic Condition 1: HTN Provider Assessment and Plan   Well-controlled HTN - Home BP readings   Continue current BP regimen Valsartan 80mg  daily 2. Encourage improved lifestyle - low sodium diet, regular exercise 3. advised to monitor BP outside office, bring readings to next visit, if persistently >140/90 or new symptoms notify office sooner         Relevant Medications    valsartan (DIOVAN) 80 MG tablet       Expected Outcome/Goals Addressed This Visit (Provider CCM goals/Provider Assessment and plan   CCM (HYPERTENSION)  EXPECTED OUTCOME:  MONITOR,SELF- MANAGE AND REDUCE SYMPTOMS OF HYPERTENSION  Symptom Management Condition 1: Take all medications as prescribed Attend all scheduled provider appointments Call provider office for new concerns or questions  call the Suicide and Crisis Lifeline: 988 call the 06-23-1986 National Suicide Prevention Lifeline: 3074926869 or TTY: (772)656-0087 TTY 612-462-8117) to talk to a trained counselor call 1-800-273-TALK (toll free, 24 hour hotline) if experiencing a Mental Health or Behavioral Health Crisis  check blood pressure weekly learn about high blood pressure take blood pressure log to all doctor appointments call doctor for signs and symptoms of high blood pressure keep all doctor appointments take medications for blood pressure exactly as prescribed report new symptoms to your doctor  Chronic Condition 2: DM Provider Assessment and Plan Currently managing Type 2 Diabetes   Will agree to sign off on orders for Diabetic Shoes. Will need the order form from patient / podiatry [facility]), we will call them today to check if they were able to receive the last order or if they can send [facility] a new document  to sign. - Patient would benefit from Diabetic Shoes due to neuropathy with callus formation and great toe deformity causing pain, diabetes control is improving on current regimen, and I am continuing to monitor and manage diabetes.   Note his shoes were attempted to be ordered months ago, they have arrived at [facility] back in July 2023, but we have not received paperwork, now patient requested, and we called podiatry and they will be faxing a form to our office today for me to sign. We will fax back and they can schedule patient to pick up shoes. Well-controlled DM previous Complications - peripheral neuropathy, hyperlipidemia, GERD, depression, obesity-  increases risk of future cardiovascular complications    Plan:  1. Trulicity 1.5mg  weekly 2. Encourage improved lifestyle - low carb, low sugar diet, reduce portion size, continue improving regular exercise 3. Check CBG , bring log to next visit for review 4. Continue ARB, future consider statin     Expected Outcome/Goals Addressed This Visit (Provider CCM goals/Provider Assessment and plan   CCM (Diabetes)  EXPECTED OUTCOME:  MONITOR,SELF- MANAGE AND REDUCE SYMPTOMS OF Diabetes     Symptom Management Condition 2: Take all medications as prescribed Attend all scheduled provider appointments Call provider office for new concerns or questions  call the Suicide and Crisis Lifeline: 988 call the 06-23-1986 National Suicide Prevention Lifeline: 570-690-5812 or TTY: 276-730-0003 TTY (917)743-2067) to talk to a trained counselor call 1-800-273-TALK (toll free, 24 hour hotline) if experiencing a Mental Health or Behavioral Health Crisis  check feet daily for cuts, sores or redness trim toenails straight across manage portion size wash and dry feet carefully every  day wear comfortable, cotton socks wear comfortable, well-fitting shoes  Chronic Condition 3: Depression  Provider Assessment and Plan  DULoxetine (CYMBALTA) 60 MG capsule Very concerned  due to failure to thrive, poor PO intake, unable to do most ADLs at home due to his chronic conditions with chronic pain and neuropathy causing him to have weakness in lower extremities, disability impacting his mobility.   He has long history of alcohol dependence, but has remained in remission on this without resumed alcohol consumption.   He has chronic hyponatremia and low potassium may be due to alcohol history but mainly with malnutrition   His Diabetes is controlled off medication due to poor nutrition and weight loss.   Advised him I am concerned that I am not able to resolve these issues, he has failed multiple medication options for managing his pain and has come off of all rx medications on his own due to side effects or not helping.   I recommended that ED may be best place to return to if he continues to fail with functioning and ADLs   I will urgently contact Chronic Care Management Team for further assistance, they hopefully can contact him this week and help me assess his in home situation, as a lot of his barriers to care seem to be with social situation lack of caregiver support. As his medical conditions are more chronic in nature and difficult to manage, but he may need escalation of care if eligible, or consider adult protective services if the situation is warranted.   Expected Outcome/Goals Addressed This Visit (Provider CCM goals/Provider Assessment and plan   CCM   EXPECTED OUTCOME:  MONITOR,SELF- MANAGE AND REDUCE SYMPTOMS OF Depression    Symptom Management Condition 3: Take all medications as prescribed Attend all scheduled provider appointments Call provider office for new concerns or questions  call the Suicide and Crisis Lifeline: 988 call the [place] National Suicide Prevention Lifeline: (718) 533-4486 or TTY: 417-499-9753 TTY 320-533-4652) to talk to a trained counselor call 1-800-273-TALK (toll free, 24 hour hotline) if experiencing a Mental Health or  Behavioral Health Crisis   Problem List 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 (Patient not taking: Reported on 02/21/2022), Disp: 42 capsule, Rfl: 0   clotrimazole-betamethasone (LOTRISONE) cream, Apply 2 times a day for worsening flare dry skin dermatitis of toes/feet, may re-use daily up to 1 week as needed., Disp: 30 g, Rfl: 2   fluticasone (FLONASE) 50 MCG/ACT nasal spray, PLACE 2 SPRAYS  INTO BOTH NOSTRILS DAILY FOR 4-6 WEEKS THEN STOP AND USE SEASONALLY OR AS NEEDED., Disp: 48 mL, Rfl: 1   levETIRAcetam (KEPPRA) 500 MG tablet, Take 1 tablet (500 mg total) by mouth 2 (two) times daily for 7 days., Disp: 14 tablet, Rfl: 0   levETIRAcetam (KEPPRA) 500 MG tablet, levetiracetam 500 mg tablet  TAKE 1 TABLET BY MOUTH TWICE A DAY FOR 7 DAYS (Patient not taking: Reported on 10/05/2021), Disp: , Rfl:    mirtazapine (REMERON)  15 MG tablet, TAKE 1 TABLET BY MOUTH EVERYDAY AT BEDTIME, Disp: 90 tablet, Rfl: 1   ondansetron (ZOFRAN-ODT) 4 MG disintegrating tablet, TAKE 1 TABLET BY MOUTH EVERY 8 HOURS AS NEEDED FOR NAUSEA AND VOMITING, Disp: 30 tablet, Rfl: 0   rOPINIRole (REQUIP) 1 MG tablet, Take 1 tablet (1 mg total) by mouth in the morning and at bedtime., Disp: 180 tablet, Rfl: 1   zolpidem (AMBIEN CR) 12.5 MG CR tablet, Take 1 tablet (12.5 mg total) by mouth at bedtime as needed for sleep., Disp: 90 tablet, Rfl: 1  Cognitive Assessment Identity Confirmed: : Name; DOB Cognitive Status: Normal   Functional Assessment Hearing Difficulty or Deaf: no Wear Glasses or Blind: no Concentrating, Remembering or Making Decisions Difficulty (CP): no Difficulty Communicating: no Difficulty Eating/Swallowing: no Walking or Climbing Stairs Difficulty: yes Walking or Climbing Stairs: ambulation difficulty, requires equipment Mobility Management: the patient has an Transport planner now and is thankful for this. The patient is also working with home health Dressing/Bathing Difficulty: yes Dressing/Bathing: bathing difficulty, assistance 1 person Dressing/Bathing Management: nees assistance with ADLs/IADLS Doing Errands Independently Difficulty (such as shopping) (CP): yes Errands Management: depends on others to do errands for him Change in Functional Status Since Onset of Current Illness/Injury: no   Caregiver Assessment  Primary Source of Support/Comfort: significant other Name of Support/Comfort Primary Source: [place] in Home: significant other Name(s) of People in Home: [place] if Needed: significant other Family Caregiver Names: Hope Primary Roles/Responsibilities: disabled Concerns About Impact on Relationships: the patient is doing better since he has his electric scooter   Planned Interventions  Evaluation of current treatment plan related to depression and patient's adherence to plan as  established by provider. The patient is doing better but is concerned about his girlfriend being in the hospital. He expressed his sincere thanks for help from the CCM team and all that has been done to help him get the things he needs. He is able to get around better and actually do more for himself.  Advised patient to call the office for changes in mood, anxiety, depression, or mental health needs Provided education to patient re: calling insurance company for assistance in where to get paperwork sent for new DME: electric wheelchair. New information provided about the way to get needed information so paperwork can be sent. He has been very frustrated with the process. Reflective listening and support given. Reviewed medications with patient and discussed compliance Collaborated with pcp regarding the home health personnel stating that he feels the patient has cellulitis in his right leg. In basket message sent to the pcp for recommendations and possible video visit scheduling. Will continue to monitor and assist accordingly  Discussed plans with patient for ongoing care management follow up and provided patient with direct contact information for care management team Advised patient to discuss changes in depression, anxiety, mood, and mental health needs, also changes in his environment that increase his risk of exacerbation of his depression  with provider  Screening for signs and symptoms of depression related to chronic disease state  Assessed social determinant of health barriers The patient is happy that he has an Transport planner and home health coming into the home. He is appreciative for the assistance he has been given. Will continue to monitor.  Provided education to patient about basic DM disease process; Reviewed medications with patient and discussed importance of medication adherence. States compliance with medications;        Reviewed prescribed diet with patient heart  healthy/ADA; Counseled on importance of regular laboratory monitoring as prescribed;        Discussed plans with patient for ongoing care management follow up and provided patient with direct contact information for care management team;      Provided patient with written educational materials related to hypo and hyperglycemia and importance of correct treatment;       call provider for findings outside established parameters;           Review of patient status, including review of consultants reports, relevant laboratory and other test results, and medications completed;       Advised patient to discuss changes in DM health and well being with provider;      Screening for signs and symptoms of depression related to chronic disease state;        Assessed social determinant of health barriers;        The patient now has home health coming into the home and he also has an Transport planner. He is doing better as far as mobility and getting around. He expressed thanks for everything the LCSW and RNCM has done to help him so his health and well being can be improved. The patient was tearful and thankful for the assistance.  Evaluation of current treatment plan related to hypertension self management and patient's adherence to plan as established by provider. The patient states he is doing some better and is thankful that home health is coming in now and that he also has an Transport planner. ;   Provided education to patient re: stroke prevention, s/s of heart attack and stroke; Reviewed prescribed diet heart healthy/ADA. Education and support given Reviewed medications with patient and discussed importance of compliance. The patient is compliant with medications. Denies any medication needs at this time;  Discussed plans with patient for ongoing care management follow up and provided patient with direct contact information for care management team; Advised patient, providing education and rationale, to  monitor blood pressure daily and record, calling PCP for findings outside established parameters;  Advised patient to discuss changes in  HTN or heart health with provider; Provided education on prescribed diet heart healthy/ADA diet ;  Discussed complications of poorly controlled blood pressure such as heart disease, stroke, circulatory complications, vision complications, kidney impairment, sexual dysfunction;  Screening for signs and symptoms of depression related to chronic disease state;  Assessed social determinant of health barriers;  Collaboration with the pcp and staff concerning the patient asking about a video visit for recommendations by the home health personnel Caesar that the patient may have cellulitis in his right leg. States his right leg is larger than the left and it is "swollen" and "tight". The patient denies any redness or pain. In basket message sent to the pcp and will follow up accordingly.     Interaction and coordination with outside resources, practitioners, and providers See CCM Referral  Care Plan: Patient declined

## 2022-06-18 NOTE — Chronic Care Management (AMB) (Signed)
Chronic Care Management   CCM RN Visit Note  06/18/2022 Name: Alex Green MRN: [id number] DOB: [birth year]  Subjective: Alex Green is a 57 y.o. year old male who is a primary care patient of [name], DO. The patient was referred to the Chronic Care Management team for assistance with care management needs subsequent to provider initiation of CCM services and plan of care.    Today's Visit:  Engaged with patient by telephone for follow up visit.        Goals Addressed             This Visit's Progress    CCM Expected Outcome:  Monitor, Self-Manage and Reduce Symptoms of  depression       Current Barriers:  Knowledge Deficits related to how to effectively manage depression, maintain independence and utilization of resources to help patient meet his health and well being needs  Care Coordination needs related to help in the home, obtaining DME, effectively getting the help he needs for mental health and well being stability in a patient with depression  Chronic Disease Management support and education needs related to effective management of depression  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 The patient is doing better but is concerned about his girlfriend being in the hospital. He expressed his sincere thanks for help from the CCM team and all that has been done to help him get the things he needs. He is able to get around better and actually do more for himself.  Advised patient to call the office for changes in mood, anxiety, depression, or mental health needs Provided education to patient re: calling insurance company for assistance in where to get paperwork sent for new DME: electric wheelchair. New information provided about the way to get needed information so paperwork can be  sent. He has been very frustrated with the process. Reflective listening and support given. Reviewed medications with patient and discussed compliance Collaborated with pcp regarding the home health personnel stating that he feels the patient has cellulitis in his right leg. In basket message sent to the pcp for recommendations and possible video visit scheduling. Will continue to monitor and assist accordingly  Discussed plans with patient for ongoing care management follow up and provided patient with direct contact information for care management team Advised patient to discuss changes in depression, anxiety, mood, and mental health needs, also changes in his environment that increase his risk of exacerbation of his depression  with provider Screening for signs and symptoms of depression related to chronic disease state  Assessed social determinant of health barriers The patient is happy that he has an Art gallery manager and home health coming into the home. He is appreciative for the assistance he has been given. Will continue to monitor.   Symptom Management: Take medications as prescribed   Attend all scheduled provider appointments Call provider office for new concerns or questions  Work with the social worker to address care coordination needs and will continue to work with the clinical team to address health care and disease management related needs call the Suicide and Crisis Lifeline: 988 call the [place] National Suicide Prevention Lifeline: (513)788-6571 or TTY: 661 157 7032 TTY 7403951383) to talk to a trained counselor call 1-800-273-TALK (toll free, 24 hour hotline) if experiencing a Mental Health or Behavioral Health Crisis   Follow Up Plan:  Telephone follow up appointment with care management team member scheduled for: 08-27-2022 at 230 pm       CCM Expected Outcome:  Monitor, Self-Manage and Reduce Symptoms of Diabetes, Neuropathy       Current Barriers:  Knowledge Deficits  related to the need to effectively manage DM and the importance of proper nutrition and hydration in patient with DM Care Coordination needs related to how to obtain resources and help in the home in a patient with DM Chronic Disease Management support and education needs related to effective management of DM 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 States compliance with medications;        Reviewed prescribed diet with patient heart healthy/ADA; Counseled on importance of regular laboratory monitoring as prescribed;        Discussed plans with patient for ongoing care management follow up and provided patient with direct contact information for care management team;      Provided patient with written educational materials related to hypo and hyperglycemia and importance of correct treatment;       call provider for findings outside established parameters;           Review of patient status, including review of consultants reports, relevant laboratory and other test results, and medications completed;       Advised patient to discuss changes in DM health and well being with provider;      Screening for signs and symptoms of depression related to chronic disease state;        Assessed social determinant of health barriers;        The patient now has home health coming into the home and he also has an Transport planner. He is doing better as far as mobility and getting around. He expressed thanks for everything  the LCSW and RNCM has done to help him so his health and well being can be improved. The patient was tearful and thankful for the assistance.   Symptom Management: Take medications as prescribed   Call pharmacy for medication refills 3-7 days in advance of running out of medications Call provider office for new concerns or questions  Work with the social worker to address care coordination needs and will continue to work with the clinical team to address health care and disease management related needs call the Suicide and Crisis Lifeline: 988 call the [place] National Suicide Prevention Lifeline: (254)786-3475 or TTY: 859-797-4098 TTY 323-833-3370) to talk to a trained counselor call 1-800-273-TALK (toll free, 24 hour hotline) if experiencing a Mental Health or [place]  check feet daily for cuts, sores or redness trim toenails straight across wash and dry feet carefully every day wear comfortable, cotton socks wear comfortable, well-fitting shoes  Follow Up Plan: Telephone follow up appointment with care management team member scheduled for: 08-27-2022 at 230 pm       CCM Expected Outcome:  Monitor, Self-Manage, and Reduce Symptoms of Hypertension       Current Barriers:  Knowledge Deficits related to the importance of maintaining normal blood pressure readings Chronic  Disease Management support and education needs related to effective management of HTN Lacks caregiver support.  Film/video editor.  BP Readings from Last 3 Encounters:  03/27/22 (!) 165/95  01/17/22 (!) 154/77  01/02/22 132/80     Planned Interventions: Evaluation of current treatment plan related to hypertension self management and patient's adherence to plan as established by provider. The patient states he is doing some better and is thankful that home health is coming in now and that he also has an Transport planner. ;   Provided education to patient re: stroke prevention, s/s of heart attack and  stroke; Reviewed prescribed diet heart healthy/ADA. Education and support given Reviewed medications with patient and discussed importance of compliance. The patient is compliant with medications. Denies any medication needs at this time;  Discussed plans with patient for ongoing care management follow up and provided patient with direct contact information for care management team; Advised patient, providing education and rationale, to monitor blood pressure daily and record, calling PCP for findings outside established parameters;  Advised patient to discuss changes in  HTN or heart health with provider; Provided education on prescribed diet heart healthy/ADA diet ;  Discussed complications of poorly controlled blood pressure such as heart disease, stroke, circulatory complications, vision complications, kidney impairment, sexual dysfunction;  Screening for signs and symptoms of depression related to chronic disease state;  Assessed social determinant of health barriers;  Collaboration with the pcp and staff concerning the patient asking about a video visit for recommendations by the home health personnel Caesar that the patient may have cellulitis in his right leg. States his right leg is larger than the left and it is "swollen" and "tight". The patient denies any redness or pain. In basket message sent to the pcp and will follow up accordingly.   Symptom Management: Take medications as prescribed   Attend all scheduled provider appointments Call pharmacy for medication refills 3-7 days in advance of running out of medications Call provider office for new concerns or questions  Work with the social worker to address care coordination needs and will continue to work with the clinical team to address health care and disease management related needs call the Suicide and Crisis Lifeline: 988 call the [place] National Suicide Prevention Lifeline: 819-096-9614 or TTY: 204-207-5671 TTY 567 285 5410)  to talk to a trained counselor call 1-800-273-TALK (toll free, 24 hour hotline) if experiencing a Mental Health or [place]  check blood pressure weekly learn about high blood pressure call doctor for signs and symptoms of high blood pressure develop an action plan for high blood pressure keep all doctor appointments take medications for blood pressure exactly as prescribed report new symptoms to your doctor eat more whole grains, fruits and vegetables, lean meats and healthy fats  Follow Up Plan: Telephone follow up appointment with care management team member scheduled for: 08-27-2022 at 230 pm          Plan:Telephone follow up appointment with care management team member scheduled for:  08-27-2022 at 230 pm  [place], MSN, CCM RN Care Manager  Chronic Care Management Direct Number: [phone]

## 2022-06-18 NOTE — Patient Instructions (Signed)
Please call the care guide team at [phone] if you need to cancel or reschedule your appointment.   If you are experiencing a Mental Health or [place] or need someone to talk to, please call the Suicide and Crisis Lifeline: 988 call the [place] National Suicide Prevention Lifeline: 458-126-9967 or TTY: (581) 773-2952 TTY 706-798-7718) to talk to a trained counselor call 1-800-273-TALK (toll free, 24 hour hotline)   Following is a copy of the CCM Program Consent:  CCM service includes personalized support from designated clinical staff supervised by the physician, including individualized plan of care and coordination with other care providers 24/7 contact phone numbers for assistance for urgent and routine care needs. Service will only be billed when office clinical staff spend 20 minutes or more in a month to coordinate care. Only one practitioner may furnish and bill the service in a calendar month. The patient may stop CCM services at amy time (effective at the end of the month) by phone call to the office staff. The patient will be responsible for cost sharing (co-pay) or up to 20% of the service fee (after annual deductible is met)  Following is a copy of your full provider care plan:   Goals Addressed             This Visit's Progress    CCM Expected Outcome:  Monitor, Self-Manage and Reduce Symptoms of  depression       Current Barriers:  Knowledge Deficits related to how to effectively manage depression, maintain independence and utilization of resources to help patient meet his health and well being needs  Care Coordination needs related to help in the home, obtaining DME, effectively getting the help he needs for mental health and well being stability in a patient with depression  Chronic Disease Management support and education needs related to effective management of depression  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 The patient is doing better but is concerned about his girlfriend being in the hospital. He expressed his sincere thanks for help from the CCM team and all that has been done to help him get the things he needs. He is able to get around better and actually do more for himself.  Advised patient to call the office for changes in mood, anxiety, depression, or mental health needs Provided education to patient re: calling insurance company for assistance in where to get paperwork sent for new DME: electric wheelchair. New information provided about the way to get needed information so paperwork can be sent. He has been very frustrated with the process. Reflective listening and support given. Reviewed medications with patient and discussed compliance Collaborated with pcp regarding the home health personnel stating that he feels the patient has cellulitis in his right leg. In basket message sent to the pcp for recommendations and possible video visit scheduling. Will continue to monitor and assist accordingly  Discussed plans with patient for ongoing care management follow up and provided patient with direct contact information for care management team Advised patient to discuss changes in depression, anxiety, mood, and mental health needs, also changes in his environment that increase his risk of exacerbation of his depression  with provider Screening for signs and symptoms of depression related to chronic disease state  Assessed social determinant of health barriers The patient is happy that he has an Transport planner and home  health coming into the home. He is appreciative for the assistance he has been given. Will continue to monitor.   Symptom Management: Take medications as prescribed   Attend all scheduled provider appointments Call provider office  for new concerns or questions  Work with the social worker to address care coordination needs and will continue to work with the clinical team to address health care and disease management related needs call the Suicide and Crisis Lifeline: 988 call the [place] National Suicide Prevention Lifeline: 6697931244 or TTY: 807-443-2379 TTY (437)350-2099) to talk to a trained counselor call 1-800-273-TALK (toll free, 24 hour hotline) if experiencing a Mental Health or [place]   Follow Up Plan: Telephone follow up appointment with care management team member scheduled for: 08-27-2022 at 230 pm       CCM Expected Outcome:  Monitor, Self-Manage and Reduce Symptoms of Diabetes, Neuropathy       Current Barriers:  Knowledge Deficits related to the need to effectively manage DM and the importance of proper nutrition and hydration in patient with DM Care Coordination needs related to how to obtain resources and help in the home in a patient with DM Chronic Disease Management support and education needs related to effective management of DM 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 States compliance with medications;        Reviewed prescribed diet with patient heart healthy/ADA; Counseled on importance of regular laboratory monitoring as prescribed;        Discussed plans with patient for ongoing care management follow up and provided patient with direct contact information for care management team;       Provided patient with written educational materials related to hypo and hyperglycemia and importance of correct treatment;       call provider for findings outside established parameters;           Review of patient status, including review of consultants reports, relevant laboratory and other test results, and medications completed;       Advised patient to discuss changes in DM health and well being with provider;      Screening for signs and symptoms of depression related to chronic disease state;        Assessed social determinant of health barriers;        The patient now has home health coming into the home and he also has an Transport planner. He is doing better as far as mobility and getting around. He expressed thanks for everything the LCSW and RNCM has done to help him so his health and well being can be improved. The patient was tearful and thankful for the assistance.   Symptom Management: Take medications as prescribed   Call pharmacy for medication refills 3-7 days in advance of running out of medications Call provider office for new concerns or questions  Work with the social worker to address care coordination needs and will continue to work with the clinical team to address health care and disease management related needs call the Suicide and Crisis Lifeline: 988 call the [place]  National Suicide Prevention Lifeline: 443-147-8009 or TTY: 734-751-2336 TTY (260)317-8376) to talk to a trained counselor call 1-800-273-TALK (toll free, 24 hour hotline) if experiencing a Mental Health or [place]  check feet daily for cuts, sores or redness trim toenails straight across wash and dry feet carefully every day wear comfortable, cotton socks wear comfortable, well-fitting shoes  Follow Up Plan: Telephone follow up appointment with care management team member scheduled for: 08-27-2022 at 230 pm       CCM Expected Outcome:  Monitor, Self-Manage, and Reduce Symptoms of  Hypertension       Current Barriers:  Knowledge Deficits related to the importance of maintaining normal blood pressure readings Chronic Disease Management support and education needs related to effective management of HTN Lacks caregiver support.  Film/video editor.  BP Readings from Last 3 Encounters:  03/27/22 (!) 165/95  01/17/22 (!) 154/77  01/02/22 132/80     Planned Interventions: Evaluation of current treatment plan related to hypertension self management and patient's adherence to plan as established by provider. The patient states he is doing some better and is thankful that home health is coming in now and that he also has an Transport planner. ;   Provided education to patient re: stroke prevention, s/s of heart attack and stroke; Reviewed prescribed diet heart healthy/ADA. Education and support given Reviewed medications with patient and discussed importance of compliance. The patient is compliant with medications. Denies any medication needs at this time;  Discussed plans with patient for ongoing care management follow up and provided patient with direct contact information for care management team; Advised patient, providing education and rationale, to monitor blood pressure daily and record, calling PCP for findings outside established parameters;  Advised patient to discuss changes in  HTN or heart health with provider; Provided education on prescribed diet heart healthy/ADA diet ;  Discussed complications of poorly controlled blood pressure such as heart disease, stroke, circulatory complications, vision complications, kidney impairment, sexual dysfunction;  Screening for signs and symptoms of depression related to chronic disease state;  Assessed social determinant of health barriers;  Collaboration with the pcp and staff concerning the patient asking about a video visit for recommendations by the home health personnel Caesar that the patient may have cellulitis in his  right leg. States his right leg is larger than the left and it is "swollen" and "tight". The patient denies any redness or pain. In basket message sent to the pcp and will follow up accordingly.   Symptom Management: Take medications as prescribed   Attend all scheduled provider appointments Call pharmacy for medication refills 3-7 days in advance of running out of medications Call provider office for new concerns or questions  Work with the social worker to address care coordination needs and will continue to work with the clinical team to address health care and disease management related needs call the Suicide and Crisis Lifeline: 988 call the [place] National Suicide Prevention Lifeline: (431)496-1198 or TTY: (805)309-5621 TTY 332-082-1407) to talk to a trained counselor call 1-800-273-TALK (toll free, 24 hour hotline) if experiencing a Mental Health or [place]  check blood pressure weekly learn about high blood pressure call doctor for signs and symptoms of high blood pressure develop an action plan for high blood pressure keep all doctor appointments take medications for blood pressure exactly as prescribed report new symptoms to your doctor eat more whole grains, fruits and vegetables, lean meats and healthy fats  Follow Up Plan: Telephone follow up  appointment with care management team member scheduled for: 08-27-2022 at 230 pm          The patient verbalized understanding of instructions, educational materials, and care plan provided today and DECLINED offer to receive copy of patient instructions, educational materials, and care plan.   Telephone follow up appointment with care management team member scheduled for: 08-27-2022 at 230 pm

## 2022-06-19 ENCOUNTER — Encounter: Payer: Self-pay | Admitting: Family Medicine

## 2022-06-19 ENCOUNTER — Ambulatory Visit (INDEPENDENT_AMBULATORY_CARE_PROVIDER_SITE_OTHER): Payer: Medicare Other | Admitting: Family Medicine

## 2022-06-19 VITALS — Ht 68.0 in | Wt 138.0 lb

## 2022-06-19 DIAGNOSIS — L03115 Cellulitis of right lower limb: Secondary | ICD-10-CM

## 2022-06-19 MED ORDER — AMOXICILLIN-POT CLAVULANATE 875-125 MG PO TABS: 1.0000 | ORAL_TABLET | Freq: Two times a day (BID) | ORAL | 0 refills | Status: DC

## 2022-06-19 NOTE — Progress Notes (Signed)
Virtual Visit via Telephone The purpose of this virtual visit is to provide medical care while limiting exposure to the novel coronavirus (COVID19) for both patient and office staff.  Consent was obtained for phone visit:  Yes.   Answered questions that patient had about telehealth interaction:  Yes.   I discussed the limitations, risks, security and privacy concerns of performing an evaluation and management service by telephone. I also discussed with the patient that there may be a patient responsible charge related to this service. The patient expressed understanding and agreed to proceed.  Patient Location: Home Provider Location: [facility] (Office)  Participants in virtual visit: - Patient: Alex Green - CMA: Burnell Blanks, CMA - Provider: Dr [name]  ---------------------------------------------------------------------- Chief Complaint  Patient presents with   skin infection    S: Reviewed CMA documentation. I have called patient and gathered additional HPI as follows:  Right Leg Swelling Redness - presumed Cellulitis Left Leg Weakness, chronic  Followed by Physical Therapy and Home health, they were concerned with Cellulitis. Patient cannot get in to office to be seen.  He has several skin lesions sores that healed from wound center from 2022. He said same areas now flared up but not open sore. No drainage of pus except if scab comes off can drain some.  He was on Augmentin in the past. He had reaction to Doxycycline.  His mobility is limited and working with   Denies any fevers, chills, sweats, body ache, cough, shortness of breath, sinus pain or pressure, headache, abdominal pain, diarrhea  Past Medical History:  Diagnosis Date   Alcoholism (HCC)    Anemia    Anxiety    GERD (gastroesophageal reflux disease)    Hypertension    Neuropathy    Social History   Tobacco Use   Smoking status: Every Day    Packs/day: 1.50    Years: 10.00     Total pack years: 15.00    Types: Cigarettes   Smokeless tobacco: Never  Substance Use Topics   Alcohol use: Yes    Alcohol/week: 50.0 standard drinks of alcohol    Types: 50 Cans of beer per week   Drug use: Not Currently    Current Outpatient Medications:    amoxicillin-clavulanate (AUGMENTIN) 875-125 MG tablet, Take 1 tablet by mouth 2 (two) times daily. For 14 days, Disp: 28 tablet, Rfl: 0   clotrimazole-betamethasone (LOTRISONE) cream, Apply 2 times a day for worsening flare dry skin dermatitis of toes/feet, may re-use daily up to 1 week as needed., Disp: 30 g, Rfl: 2   fluticasone (FLONASE) 50 MCG/ACT nasal spray, PLACE 2 SPRAYS INTO BOTH NOSTRILS DAILY FOR 4-6 WEEKS THEN STOP AND USE SEASONALLY OR AS NEEDED., Disp: 48 mL, Rfl: 1   mirtazapine (REMERON) 15 MG tablet, TAKE 1 TABLET BY MOUTH EVERYDAY AT BEDTIME, Disp: 90 tablet, Rfl: 1   ondansetron (ZOFRAN-ODT) 4 MG disintegrating tablet, TAKE 1 TABLET BY MOUTH EVERY 8 HOURS AS NEEDED FOR NAUSEA AND VOMITING, Disp: 30 tablet, Rfl: 0   rOPINIRole (REQUIP) 1 MG tablet, Take 1 tablet (1 mg total) by mouth in the morning and at bedtime., Disp: 180 tablet, Rfl: 1   zolpidem (AMBIEN CR) 12.5 MG CR tablet, Take 1 tablet (12.5 mg total) by mouth at bedtime as needed for sleep., Disp: 90 tablet, Rfl: 1   levETIRAcetam (KEPPRA) 500 MG tablet, Take 1 tablet (500 mg total) by mouth 2 (two) times daily for 7 days., Disp: 14 tablet, Rfl:  0   levETIRAcetam (KEPPRA) 500 MG tablet, levetiracetam 500 mg tablet  TAKE 1 TABLET BY MOUTH TWICE A DAY FOR 7 DAYS (Patient not taking: Reported on 10/05/2021), Disp: , Rfl:      03/27/2022    1:43 PM 02/04/2022   12:21 PM 10/05/2021   10:32 AM  Depression screen PHQ 2/9  Decreased Interest 1 1 1   Down, Depressed, Hopeless 1 1 1   PHQ - 2 Score 2 2 2   Altered sleeping 1 1 1   Tired, decreased energy 1 0 1  Change in appetite 1 0 1  Feeling bad or failure about yourself  1 1 1   Trouble concentrating 0 0 0   Moving slowly or fidgety/restless 0 0 0  Suicidal thoughts  0 0  PHQ-9 Score 6 4 6   Difficult doing work/chores Somewhat difficult  Somewhat difficult       03/27/2022    1:44 PM 04/21/2020    3:28 PM  GAD 7 : Generalized Anxiety Score  Nervous, Anxious, on Edge 2 2  Control/stop worrying 0 0  Worry too much - different things 0 0  Trouble relaxing 0 0  Restless 0 0  Easily annoyed or irritable 1 1  Afraid - awful might happen 1 1  Total GAD 7 Score 4 4  Anxiety Difficulty Somewhat difficult Somewhat difficult    -------------------------------------------------------------------------- O: No physical exam performed due to remote telephone encounter.  Lab results reviewed.  No results found for this or any previous visit (from the past 2160 hour(s)).  -------------------------------------------------------------------------- A&P:  Problem List Items Addressed This Visit   None Visit Diagnoses     Cellulitis of right lower extremity    -  Primary   Relevant Medications   amoxicillin-clavulanate (AUGMENTIN) 875-125 MG tablet      Clinically with history of RLE cellulitis with possible wound ulceration that reportedly is not open or draining. He has had similar history in the past 2022 treated with antibiotic course and wound care He has chronic debilitation with limited mobility and weakness with his legs  Limited today by virtual phone visit only, does not have capability for video  Will order rx Augmentin TWICE A DAY antibiotic for 14 day course Advised him to follow w/ home health and CCM team and notify us if any worsening or new concerns May need to return to Wound Care in future If worsening acutely can go to ED or UC for evaluation in person, may need I&D or wound care management  Additionally unable to differentiate with leg swelling by verbal history of other cause such as DVT or other acute concern, history and Home health suggestive of infection  cellulitis, will treat accordingly but again if not improving needs to seek acute care.  Meds ordered this encounter  Medications   amoxicillin-clavulanate (AUGMENTIN) 875-125 MG tablet    Sig: Take 1 tablet by mouth 2 (two) times daily. For 14 days    Dispense:  28 tablet    Refill:  0    Follow-up: - Return in 1-2 weeks if not improving, can seek care UC or ED if worsening sounds, fever, redness swelling drainage  Patient verbalizes understanding with the above medical recommendations including the limitation of remote medical advice.  Specific follow-up and call-back criteria were given for patient to follow-up or seek medical care more urgently if needed.   - Time spent in direct consultation with patient on phone: 12 minutes   Nobie Putnam, [facility]  [facility] 06/19/2022, 11:51 AM

## 2022-06-19 NOTE — Patient Instructions (Addendum)
   Please schedule a Follow-up Appointment to: Return if symptoms worsen or fail to improve.  If you have any other questions or concerns, please feel free to call the office or send a message through MyChart. You may also schedule an earlier appointment if necessary.  Additionally, you may be receiving a survey about your experience at our office within a few days to 1 week by e-mail or mail. We value your feedback.  Paisyn Guercio, DO [facility], [facility] 

## 2022-06-21 ENCOUNTER — Telehealth: Payer: Self-pay | Admitting: Family Medicine

## 2022-06-21 NOTE — Telephone Encounter (Signed)
Home Health Verbal Orders - Caller/Agency: Cesar/ Santina [facility] Number: [phone]/ vm can be left  Requesting Social Work Frequency: for Evaluation

## 2022-06-24 ENCOUNTER — Ambulatory Visit: Payer: Self-pay | Admitting: *Deleted

## 2022-06-24 NOTE — Telephone Encounter (Signed)
Okay to proceed w/ verbal orders  Nobie Putnam, [facility] 06/24/2022, 5:38 PM

## 2022-06-24 NOTE — Telephone Encounter (Signed)
  Chief Complaint: Fall  Symptoms: Pt states fell out of bed Saturday, EMS came , did not transport. "Checked everything, OK."  Pt states "Sore all over, bruising everywhere." Pt is evasive historian, hard to understand and redirect.  States he just wants to know if this was noted.  Frequency: Saturday Pertinent Negatives: Patient denies ROM at baseline Disposition: [] ED /[] Urgent Care (no appt availability in office) / [] Appointment(In office/virtual)/ []  [facility]/ [] Home Care/ [] Refused Recommended Disposition /[] [facility]/ [x]  Follow-up with PCP Additional Notes:  declined appt, pt also states he does not recall ele visit of 1/3/4. States not taking any ATBs. "I don't know I don't think so."   Assured pt NT would route to practice for PCPs review.  Reason for Disposition  [1] Caller has NON-URGENT question AND [2] triager unable to answer question  Answer Assessment - Initial Assessment Questions 1. MECHANISM: "How did the fall happen?"     Fell from bed. 2. DOMESTIC VIOLENCE AND ELDER ABUSE SCREENING: "Did you fall because someone pushed you or tried to hurt you?" If Yes, ask: "Are you safe now?"     no 3. ONSET: "When did the fall happen?" (e.g., minutes, hours, or days ago)     Saturday 4. LOCATION: "What part of the body hit the ground?" (e.g., back, buttocks, head, hips, knees, hands, head, stomach)     All except head 5. INJURY: "Did you hurt (injure) yourself when you fell?" If Yes, ask: "What did you injure? Tell me more about this?" (e.g., body area; type of injury; pain severity)"     EMS saw, did not transport 6. PAIN: "Is there any pain?" If Yes, ask: "How bad is the pain?" (e.g., Scale 1-10; or mild,  moderate, severe)   - NONE (0): No pain   - MILD (1-3): Doesn't interfere with normal activities    - MODERATE (4-7): Interferes with normal activities or awakens from sleep    - SEVERE (8-10): Excruciating pain, unable to do any normal activities       None "Sore" 7. SIZE: For cuts, bruises, or swelling, ask: "How large is it?" (e.g., inches or centimeters)      Bruising all over, spots 9. OTHER SYMPTOMS: "Do you have any other symptoms?" (e.g., dizziness, fever, weakness; new onset or worsening).      "Stiff" 10. CAUSE: "What do you think caused the fall (or falling)?" (e.g., tripped, dizzy spell)       Just fell out of bed.  Protocols used: Falls and [facility]

## 2022-06-25 NOTE — Telephone Encounter (Signed)
Secure message left for Alex Green with details about the wish to proceed per Dr. Raliegh Ip.

## 2022-06-26 ENCOUNTER — Ambulatory Visit: Payer: Self-pay

## 2022-06-26 NOTE — Telephone Encounter (Signed)
    Chief Complaint: Alex Green , [place] concerned because of recent falls and weakness. States pt.'s caregiver also having health issues. Request OV "to have pt. Checked for this weakness." Symptoms: Above Frequency: 1 -2 weeks Pertinent Negatives: Patient denies  Disposition: [] ED /[] Urgent Care (no appt availability in office) / [x] Appointment(In office/virtual)/ []  [facility]/ [] Home Care/ [] Refused Recommended Disposition /[] [facility]/ []  Follow-up with PCP Additional Notes: Will go to ED for worsening of symptoms.  Reason for Disposition  [1] MODERATE weakness (i.e., interferes with work, school, normal activities) AND [2] persists > 3 days  Answer Assessment - Initial Assessment Questions 1. DESCRIPTION: "Describe how you are feeling."     Weak per [place] social worker 2. SEVERITY: "How bad is it?"  "Can you stand and walk?"   - MILD (0-3): Feels weak or tired, but does not interfere with work, school or normal activities.   - MODERATE (4-7): Able to stand and walk; weakness interferes with work, school, or normal activities.   - SEVERE (8-10): Unable to stand or walk; unable to do usual activities.     Moderate 3. ONSET: "When did these symptoms begin?" (e.g., hours, days, weeks, months)     1-2 weeks 4. CAUSE: "What do you think is causing the weakness or fatigue?" (e.g., not drinking enough fluids, medical problem, trouble sleeping)     Unsure 5. NEW MEDICINES:  "Have you started on any new medicines recently?" (e.g., opioid pain medicines, benzodiazepines, muscle relaxants, antidepressants, antihistamines, neuroleptics, beta blockers)     No 6. OTHER SYMPTOMS: "Do you have any other symptoms?" (e.g., chest pain, fever, cough, SOB, vomiting, diarrhea, bleeding, other areas of pain)     Has had recent falls 7. PREGNANCY: "Is there any chance you are pregnant?" "When was your last menstrual period?"     N/a  Protocols used:  Weakness (Generalized) and Fatigue-A-AH

## 2022-06-27 ENCOUNTER — Ambulatory Visit: Payer: Medicare Other | Admitting: Internal Medicine

## 2022-06-27 NOTE — Progress Notes (Deleted)
Subjective:    Patient ID: Alex Green, male    DOB: Jul 07, 1965, 57 y.o.   MRN: [id number]  HPI  Patient presents to clinic today with complaint of generalized weakness and frequent falls.  He reports this started about 1 to 2 weeks ago.  He does have a history of peripheral neuropathy.  Review of Systems  Past Medical History:  Diagnosis Date   Alcoholism ([place])    Anemia    Anxiety    GERD (gastroesophageal reflux disease)    Hypertension    Neuropathy     Current Outpatient Medications  Medication Sig Dispense Refill   amoxicillin-clavulanate (AUGMENTIN) 875-125 MG tablet Take 1 tablet by mouth 2 (two) times daily. For 14 days 28 tablet 0   clotrimazole-betamethasone (LOTRISONE) cream Apply 2 times a day for worsening flare dry skin dermatitis of toes/feet, may re-use daily up to 1 week as needed. 30 g 2   fluticasone (FLONASE) 50 MCG/ACT nasal spray PLACE 2 SPRAYS INTO BOTH NOSTRILS DAILY FOR 4-6 WEEKS THEN STOP AND USE SEASONALLY OR AS NEEDED. 48 mL 1   levETIRAcetam (KEPPRA) 500 MG tablet Take 1 tablet (500 mg total) by mouth 2 (two) times daily for 7 days. 14 tablet 0   levETIRAcetam (KEPPRA) 500 MG tablet levetiracetam 500 mg tablet  TAKE 1 TABLET BY MOUTH TWICE A DAY FOR 7 DAYS (Patient not taking: Reported on 10/05/2021)     mirtazapine (REMERON) 15 MG tablet TAKE 1 TABLET BY MOUTH EVERYDAY AT BEDTIME 90 tablet 1   ondansetron (ZOFRAN-ODT) 4 MG disintegrating tablet TAKE 1 TABLET BY MOUTH EVERY 8 HOURS AS NEEDED FOR NAUSEA AND VOMITING 30 tablet 0   rOPINIRole (REQUIP) 1 MG tablet Take 1 tablet (1 mg total) by mouth in the morning and at bedtime. 180 tablet 1   zolpidem (AMBIEN CR) 12.5 MG CR tablet Take 1 tablet (12.5 mg total) by mouth at bedtime as needed for sleep. 90 tablet 1   No current facility-administered medications for this visit.    Allergies  Allergen Reactions   Other     "Bioxin gives me a yeast infection"    No family history on file.  Social  History   Socioeconomic History   Marital status: Divorced    Spouse name: Not on file   Number of children: Not on file   Years of education: Not on file   Highest education level: Not on file  Occupational History   Not on file  Tobacco Use   Smoking status: Every Day    Packs/day: 1.50    Years: 10.00    Total pack years: 15.00    Types: Cigarettes   Smokeless tobacco: Never  Substance and Sexual Activity   Alcohol use: Yes    Alcohol/week: 50.0 standard drinks of alcohol    Types: 50 Cans of beer per week   Drug use: Not Currently   Sexual activity: Not on file  Other Topics Concern   Not on file  Social History Narrative   ** Merged History Encounter **       Social Determinants of Health   Financial Resource Strain: High Risk (02/07/2022)   Overall Financial Resource Strain (CARDIA)    Difficulty of Paying Living Expenses: Hard  Food Insecurity: No Food Insecurity (02/26/2022)   Hunger Vital Sign    Worried About Running Out of Food in the Last Year: Never true    Ran Out of Food in the Last Year: Never true  Transportation Needs: No Transportation Needs (02/26/2022)   PRAPARE - Hydrologist (Medical): No    Lack of Transportation (Non-Medical): No  Physical Activity: Inactive (04/22/2022)   Exercise Vital Sign    Days of Exercise per Week: 0 days    Minutes of Exercise per Session: 0 min  Stress: Stress Concern Present (04/17/2022)   [place]    Feeling of Stress : To some extent  Social Connections: Socially Isolated (02/04/2022)   Social Connection and Isolation Panel [NHANES]    Frequency of Communication with Friends and Family: Once a week    Frequency of Social Gatherings with Friends and Family: Never    Attends Religious Services: Never    Marine scientist or Organizations: No    Attends Archivist Meetings: Never    Marital Status: Living  with partner  Intimate Partner Violence: Not At Risk (10/05/2021)   Humiliation, Afraid, Rape, and Kick questionnaire    Fear of Current or Ex-Partner: No    Emotionally Abused: No    Physically Abused: No    Sexually Abused: No     Constitutional: Denies fever, malaise, fatigue, headache or abrupt weight changes.  HEENT: Denies eye pain, eye redness, ear pain, ringing in the ears, wax buildup, runny nose, nasal congestion, bloody nose, or sore throat. Respiratory: Denies difficulty breathing, shortness of breath, cough or sputum production.   Cardiovascular: Denies chest pain, chest tightness, palpitations or swelling in the hands or feet.  Gastrointestinal: Denies abdominal pain, bloating, constipation, diarrhea or blood in the stool.  GU: Denies urgency, frequency, pain with urination, burning sensation, blood in urine, odor or discharge. Musculoskeletal: Patient reports generalized weakness and frequent falls.  Denies decrease in range of motion, muscle pain or joint pain and swelling.  Skin: Denies redness, rashes, lesions or ulcercations.  Neurological: Patient reports neuropathy.  Denies dizziness, difficulty with memory, difficulty with speech or problems with balance and coordination.  Psych: Denies anxiety, depression, SI/HI.  No other specific complaints in a complete review of systems (except as listed in HPI above).     Objective:   Physical Exam   There were no vitals taken for this visit. Wt Readings from Last 3 Encounters:  06/19/22 138 lb (62.6 kg)  03/27/22 138 lb (62.6 kg)  01/16/22 158 lb (71.7 kg)    General: Appears their stated age, well developed, well nourished in NAD. Skin: Warm, dry and intact. No rashes, lesions or ulcerations noted. HEENT: Head: normal shape and size; Eyes: sclera white, no icterus, conjunctiva pink, PERRLA and EOMs intact; Ears: Tm's gray and intact, normal light reflex; Nose: mucosa pink and moist, septum midline; Throat/Mouth: Teeth  present, mucosa pink and moist, no exudate, lesions or ulcerations noted.  Neck:  Neck supple, trachea midline. No masses, lumps or thyromegaly present.  Cardiovascular: Normal rate and rhythm. S1,S2 noted.  No murmur, rubs or gallops noted. No JVD or BLE edema. No carotid bruits noted. Pulmonary/Chest: Normal effort and positive vesicular breath sounds. No respiratory distress. No wheezes, rales or ronchi noted.  Abdomen: Soft and nontender. Normal bowel sounds. No distention or masses noted. Liver, spleen and kidneys non palpable. Musculoskeletal: Normal range of motion. No signs of joint swelling. No difficulty with gait.  Neurological: Alert and oriented. Cranial nerves II-XII grossly intact. Coordination normal.  Psychiatric: Mood and affect normal. Behavior is normal. Judgment and thought content normal.   BMET  Component Value Date/Time   NA 129 (L) 01/16/2022 2326   NA 134 (A) 04/13/2020 0000   K 3.1 (L) 01/16/2022 2326   CL 92 (L) 01/16/2022 2326   CO2 23 01/16/2022 2326   GLUCOSE 147 (H) 01/16/2022 2326   BUN 15 01/16/2022 2326   BUN 3 (A) 04/13/2020 0000   CREATININE 1.35 (H) 01/16/2022 2326   CREATININE 1.15 01/02/2022 1409   CALCIUM 8.9 01/16/2022 2326   GFRNONAA >60 01/16/2022 2326   GFRNONAA 97 07/31/2020 1125   GFRAA 112 07/31/2020 1125    Lipid Panel     Component Value Date/Time   CHOL 150 01/02/2022 1409   TRIG 115 01/02/2022 1409   HDL 35 (L) 01/02/2022 1409   CHOLHDL 4.3 01/02/2022 1409   LDLCALC 94 01/02/2022 1409    CBC    Component Value Date/Time   WBC 11.4 (H) 01/16/2022 2326   RBC 4.74 01/16/2022 2326   HGB 14.6 01/16/2022 2326   HCT 42.3 01/16/2022 2326   PLT 260 01/16/2022 2326   MCV 89.2 01/16/2022 2326   MCH 30.8 01/16/2022 2326   MCHC 34.5 01/16/2022 2326   RDW 12.5 01/16/2022 2326   LYMPHSABS 0.9 01/16/2022 2326   MONOABS 0.8 01/16/2022 2326   EOSABS 0.0 01/16/2022 2326   BASOSABS 0.0 01/16/2022 2326    Hgb A1C Lab Results   Component Value Date   HGBA1C 5.5 01/02/2022           Assessment & Plan:     Follow-up with your PCP as previously scheduled Webb Silversmith, NP

## 2022-06-28 ENCOUNTER — Ambulatory Visit: Payer: Medicare Other | Admitting: Family Medicine

## 2022-06-28 ENCOUNTER — Encounter: Payer: Self-pay | Admitting: Family Medicine

## 2022-06-28 VITALS — BP 142/90 | HR 100 | Ht 68.0 in | Wt 132.8 lb

## 2022-06-28 DIAGNOSIS — E1140 Type 2 diabetes mellitus with diabetic neuropathy, unspecified: Secondary | ICD-10-CM | POA: Diagnosis not present

## 2022-06-28 DIAGNOSIS — E440 Moderate protein-calorie malnutrition: Secondary | ICD-10-CM

## 2022-06-28 DIAGNOSIS — R296 Repeated falls: Secondary | ICD-10-CM

## 2022-06-28 DIAGNOSIS — R600 Localized edema: Secondary | ICD-10-CM

## 2022-06-28 DIAGNOSIS — R195 Other fecal abnormalities: Secondary | ICD-10-CM

## 2022-06-28 DIAGNOSIS — I872 Venous insufficiency (chronic) (peripheral): Secondary | ICD-10-CM | POA: Diagnosis not present

## 2022-06-28 DIAGNOSIS — R630 Anorexia: Secondary | ICD-10-CM

## 2022-06-28 DIAGNOSIS — F411 Generalized anxiety disorder: Secondary | ICD-10-CM

## 2022-06-28 DIAGNOSIS — R29898 Other symptoms and signs involving the musculoskeletal system: Secondary | ICD-10-CM | POA: Diagnosis not present

## 2022-06-28 DIAGNOSIS — E1142 Type 2 diabetes mellitus with diabetic polyneuropathy: Secondary | ICD-10-CM

## 2022-06-28 DIAGNOSIS — G3184 Mild cognitive impairment, so stated: Secondary | ICD-10-CM

## 2022-06-28 DIAGNOSIS — H109 Unspecified conjunctivitis: Secondary | ICD-10-CM

## 2022-06-28 MED ORDER — ERYTHROMYCIN 5 MG/GM OP OINT: 1.0000 | TOPICAL_OINTMENT | Freq: Every evening | OPHTHALMIC | 2 refills | Status: DC | PRN

## 2022-06-28 MED ORDER — BUSPIRONE HCL 5 MG PO TABS: 5.0000 mg | ORAL_TABLET | Freq: Three times a day (TID) | ORAL | 1 refills | Status: DC | PRN

## 2022-06-28 NOTE — Patient Instructions (Addendum)
Thank you for coming to the office today.  For the lower extremity swelling  I will do referral for the Vascular to help with an Unna Boot wrap to help treat swelling and skin infection.  [facility] [address] [place],  [place]  [address] Main: [phone]  Use RICE therapy: - R - Rest / relative rest with activity modification avoid overuse of joint - I - Ice packs (make sure you use a towel or sock / something to protect skin) - C - Compression with ACE wrap to apply pressure and reduce swelling allowing more support - E - Elevation - if significant swelling, lift leg above heart level (toes above your nose) to help reduce swelling, most helpful at night after day of being on your feet  --------  For loose stools  I would recommend trial of IBGard OTC Peppermint Oil (Triple Coated Capsule) 180mg  take one 3 times daily to reduce diarrhea  Keep taking Fiber supplement  Probiotic to help.  [facility] - Neurology Dept [place], [place] [address] Phone: [phone]  -----  [facility]   Please schedule a Follow-up Appointment to: No follow-ups on file.  If you have any other questions or concerns, please feel free to call the office or send a message through [place]. You may also schedule an earlier appointment if necessary.  Additionally, you may be receiving a survey about your experience at our office within a few days to 1 week by e-mail or mail. We value your feedback.  Nobie Putnam, DO [place]

## 2022-06-28 NOTE — Progress Notes (Signed)
Subjective:    Patient ID: Alex Green, male    DOB: [birth year], 57 y.o.   MRN: [id number]  Alex Green is a 57 y.o. male presenting on 06/28/2022 for Diarrhea   HPI  Stasis Dermatitis Dry Skin Dermatitis Lower Extremity Edema  Follow-up Cellulitis, RLE Last visit virtually 06/19/22, was given Augmentin, improvement at that time. Left Leg Weakness, chronic Continues w/ Home Health patient    He has several skin lesions sores that healed from wound center from 2022. He said same areas now flared up but not open sore. No drainage of pus except if scab comes off can drain some.   His mobility is limited and working with PT still Using rolling walker rollator  Confusion, cognitive decline Reported by caregiver, Less Hygiene. More episodes few months with confusion Interested to see Neurology for consult  Poor appetite and nutrition at times. Weight down 40 lbs. Loose stool diarrhea Request consult  Anxiety, GAD Chronic problem with episodic flares of anxiety Not on med currently. Interested in med       03/27/2022    1:43 PM 02/04/2022   12:21 PM 10/05/2021   10:32 AM  Depression screen PHQ 2/9  Decreased Interest 1 1 1   Down, Depressed, Hopeless 1 1 1   PHQ - 2 Score 2 2 2   Altered sleeping 1 1 1   Tired, decreased energy 1 0 1  Change in appetite 1 0 1  Feeling bad or failure about yourself  1 1 1   Trouble concentrating 0 0 0  Moving slowly or fidgety/restless 0 0 0  Suicidal thoughts  0 0  PHQ-9 Score 6 4 6   Difficult doing work/chores Somewhat difficult  Somewhat difficult    Social History   Tobacco Use   Smoking status: Every Day    Packs/day: 1.50    Years: 10.00    Total pack years: 15.00    Types: Cigarettes   Smokeless tobacco: Never  Substance Use Topics   Alcohol use: Yes    Alcohol/week: 50.0 standard drinks of alcohol    Types: 50 Cans of beer per week   Drug use: Not Currently    Review of Systems Per HPI unless specifically  indicated above     Objective:    BP (!) 142/90   Pulse 100   Ht 5\' 8"  (1.727 m)   Wt 132 lb 12.8 oz (60.2 kg)   SpO2 96%   BMI 20.19 kg/m   Wt Readings from Last 3 Encounters:  06/28/22 132 lb 12.8 oz (60.2 kg)  06/19/22 138 lb (62.6 kg)  03/27/22 138 lb (62.6 kg)    Physical Exam Vitals and nursing note reviewed.  Constitutional:      General: He is not in acute distress.    Appearance: He is well-developed. He is not diaphoretic.     Comments: Chronically ill and thin appearing, comfortable, cooperative  HENT:     Head: Normocephalic and atraumatic.  Eyes:     General:        Right eye: No discharge.        Left eye: No discharge.     Conjunctiva/sclera: Conjunctivae normal.     Pupils: Pupils are equal, round, and reactive to light.  Neck:     Thyroid: No thyromegaly.  Cardiovascular:     Rate and Rhythm: Normal rate and regular rhythm.     Pulses: Normal pulses.     Heart sounds: Normal heart sounds. No murmur heard. Pulmonary:  Effort: Pulmonary effort is normal. No respiratory distress.     Breath sounds: Normal breath sounds. No wheezing or rales.  Abdominal:     General: Bowel sounds are normal. There is no distension.     Palpations: Abdomen is soft. There is no mass.     Tenderness: There is no abdominal tenderness.  Musculoskeletal:     Cervical back: Normal range of motion and neck supple.  Lymphadenopathy:     Cervical: No cervical adenopathy.  Skin:    General: Skin is warm and dry.     Findings: No erythema or rash (Dry skin dermatitis R lower ext).  Neurological:     Mental Status: He is alert and oriented to person, place, and time.     Comments: Distal sensation intact to light touch all extremities  Psychiatric:        Mood and Affect: Mood normal.        Behavior: Behavior normal.        Thought Content: Thought content normal.     Comments: Well groomed, good eye contact, normal speech and thoughts      Results for orders placed  or performed during the hospital encounter of 01/16/22  Comprehensive metabolic panel  Result Value Ref Range   Sodium 129 (L) 135 - 145 mmol/L   Potassium 3.1 (L) 3.5 - 5.1 mmol/L   Chloride 92 (L) 98 - 111 mmol/L   CO2 23 22 - 32 mmol/L   Glucose, Bld 147 (H) 70 - 99 mg/dL   BUN 15 6 - 20 mg/dL   Creatinine, Ser 0.09 (H) 0.61 - 1.24 mg/dL   Calcium 8.9 8.9 - 38.1 mg/dL   Total Protein 8.3 (H) 6.5 - 8.1 g/dL   Albumin 3.7 3.5 - 5.0 g/dL   AST 26 15 - 41 U/L   ALT 17 0 - 44 U/L   Alkaline Phosphatase 120 38 - 126 U/L   Total Bilirubin 1.3 (H) 0.3 - 1.2 mg/dL   GFR, Estimated >82 >99 mL/min   Anion gap 14 5 - 15  CBC with Differential  Result Value Ref Range   WBC 11.4 (H) 4.0 - 10.5 K/uL   RBC 4.74 4.22 - 5.81 MIL/uL   Hemoglobin 14.6 13.0 - 17.0 g/dL   HCT 37.1 69.6 - 78.9 %   MCV 89.2 80.0 - 100.0 fL   MCH 30.8 26.0 - 34.0 pg   MCHC 34.5 30.0 - 36.0 g/dL   RDW 38.1 01.7 - 51.0 %   Platelets 260 150 - 400 K/uL   nRBC 0.0 0.0 - 0.2 %   Neutrophils Relative % 84 %   Neutro Abs 9.5 (H) 1.7 - 7.7 K/uL   Lymphocytes Relative 8 %   Lymphs Abs 0.9 0.7 - 4.0 K/uL   Monocytes Relative 7 %   Monocytes Absolute 0.8 0.1 - 1.0 K/uL   Eosinophils Relative 0 %   Eosinophils Absolute 0.0 0.0 - 0.5 K/uL   Basophils Relative 0 %   Basophils Absolute 0.0 0.0 - 0.1 K/uL   Immature Granulocytes 1 %   Abs Immature Granulocytes 0.15 (H) 0.00 - 0.07 K/uL  Magnesium  Result Value Ref Range   Magnesium 2.2 1.7 - 2.4 mg/dL  Ethanol  Result Value Ref Range   Alcohol, Ethyl (B) <10 <10 mg/dL      Assessment & Plan:   Problem List Items Addressed This Visit     Controlled type 2 diabetes mellitus with neuropathy (HCC) - Primary  GAD (generalized anxiety disorder)   Relevant Medications   busPIRone (BUSPAR) 5 MG tablet   Recurrent falls   Relevant Orders   Ambulatory referral to Neurology   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[place])   Relevant Medications   busPIRone  (BUSPAR) 5 MG tablet   Other Relevant Orders   Ambulatory referral to Neurology   Other Visit Diagnoses     Weakness of both lower extremities       Relevant Orders   Ambulatory referral to Neurology   Stasis dermatitis of both legs       Relevant Orders   Ambulatory referral to Vascular Surgery   Appetite loss       Relevant Orders   Ambulatory referral to Gastroenterology   Moderate protein-calorie malnutrition ([place])       Bilateral lower extremity edema       Relevant Orders   Ambulatory referral to Vascular Surgery   Mild cognitive impairment with memory loss       Relevant Orders   Ambulatory referral to Neurology   Loose stools       Relevant Orders   Ambulatory referral to Gastroenterology   Conjunctivitis of right eye, unspecified conjunctivitis type       Relevant Medications   erythromycin ophthalmic ointment       For the lower extremity swelling / venous stasis dermatitis / dry skin dermatitis No sign of active cellulitis today. No ulceration. No open wounds.  I will do referral for the Vascular to help with an Unna Boot wrap to help treat swelling and skin infection.  [facility] [address] [place],  [place]  [address] Main: [phone]  Use RICE therapy: - R - Rest / relative rest with activity modification avoid overuse of joint - I - Ice packs (make sure you use a towel or sock / something to protect skin) - C - Compression with ACE wrap to apply pressure and reduce swelling allowing more support - E - Elevation - if significant swelling, lift leg above heart level (toes above your nose) to help reduce swelling, most helpful at night after day of being on your feet  --------  GAD Anxiety Buspar 5mg  THREE TIMES A DAY AS NEEDED, caution dizziness  For loose stools  I would recommend trial of IBGard OTC Peppermint Oil (Triple Coated Capsule) 180mg  take one 3 times daily to reduce diarrhea  Keep  taking Fiber supplement  Probiotic to help.  Referral to [facility] for further management of chronic digestion problems, seems more functional.    referral to Neurologist for episodic confusion episodes, some cognitive decline with multiple comorbid conditions with diabetes, chronic illness. He is needing further cognitive evaluation and imaging, there have been concerns of TIA episodic   Strict return precautions when to seek care at hospital ED if worsening confusion / memory  [facility] - Neurology Dept [place], [place] [address] Phone: [phone]   Orders Placed This Encounter  Procedures   Ambulatory referral to Vascular Surgery    Referral Priority:   Routine    Referral Type:   Surgical    Referral Reason:   Specialty Services Required    Requested Specialty:   Vascular Surgery    Number of Visits Requested:   1   Ambulatory referral to Gastroenterology    Referral Priority:   Routine    Referral Type:   Consultation    Referral  Reason:   Specialty Services Required    Number of Visits Requested:   1   Ambulatory referral to Neurology    Referral Priority:   Routine    Referral Type:   Consultation    Referral Reason:   Specialty Services Required    Requested Specialty:   Neurology    Number of Visits Requested:   1     Meds ordered this encounter  Medications   erythromycin ophthalmic ointment    Sig: Place 1 Application into both eyes at bedtime as needed.    Dispense:  3.5 g    Refill:  2   busPIRone (BUSPAR) 5 MG tablet    Sig: Take 1 tablet (5 mg total) by mouth 3 (three) times daily as needed (anxiety panic).    Dispense:  90 tablet    Refill:  1    Follow up plan: Return if symptoms worsen or fail to improve.   Saralyn Pilar, DO [facility] 06/28/2022, 11:22 AM

## 2022-07-04 ENCOUNTER — Telehealth: Payer: Self-pay | Admitting: Family Medicine

## 2022-07-04 NOTE — Telephone Encounter (Signed)
Okay to proceed with orders.  Alex Green, [facility] 07/04/2022, 2:34 PM

## 2022-07-04 NOTE — Telephone Encounter (Signed)
Copied from [place] [id number]. Topic: Quick Communication - Home Health Verbal Orders >> Jul 04, 2022 12:57 PM Cyndi Bender wrote: Caller/Agency: Costella Hatcher with [facility] Number: [phone] Requesting OT/PT/Skilled Nursing/Social Work/Speech Therapy: PT and Nursing evaluation Frequency: 1 x 2  starting next week

## 2022-07-08 NOTE — Telephone Encounter (Signed)
Message left on machine letting them know.

## 2022-07-17 ENCOUNTER — Other Ambulatory Visit: Payer: Self-pay | Admitting: Physician Assistant

## 2022-07-17 DIAGNOSIS — I10 Essential (primary) hypertension: Secondary | ICD-10-CM | POA: Diagnosis not present

## 2022-07-17 DIAGNOSIS — F32A Depression, unspecified: Secondary | ICD-10-CM

## 2022-07-17 DIAGNOSIS — E1140 Type 2 diabetes mellitus with diabetic neuropathy, unspecified: Secondary | ICD-10-CM

## 2022-07-17 DIAGNOSIS — R29898 Other symptoms and signs involving the musculoskeletal system: Secondary | ICD-10-CM

## 2022-07-17 DIAGNOSIS — G3184 Mild cognitive impairment, so stated: Secondary | ICD-10-CM

## 2022-07-19 ENCOUNTER — Telehealth: Payer: Self-pay | Admitting: Family Medicine

## 2022-07-19 DIAGNOSIS — R7989 Other specified abnormal findings of blood chemistry: Secondary | ICD-10-CM

## 2022-07-19 DIAGNOSIS — E559 Vitamin D deficiency, unspecified: Secondary | ICD-10-CM

## 2022-07-19 NOTE — Telephone Encounter (Signed)
Please notify patient that we received his results from [facility].  His Vitamin D is very low at 4.  I would suggest that he increase the Vitamin D3 from 1,000 unit a day UP TO 5,000 units per dose, per day for 3 months.  We will need to repeat labs in about 3 months to repeat Thyroid level and Vitamin D level.  Please schedule for lab and to see me 1 week later.  Unless he goes back to [facility] to repeat labs.  Nobie Putnam, DO [facility] 07/19/2022, 5:16 PM

## 2022-07-24 NOTE — Telephone Encounter (Signed)
I left a message letting him know the plan and to call us back.

## 2022-07-25 ENCOUNTER — Other Ambulatory Visit: Payer: Self-pay | Admitting: Family Medicine

## 2022-07-25 DIAGNOSIS — F411 Generalized anxiety disorder: Secondary | ICD-10-CM

## 2022-07-25 NOTE — Telephone Encounter (Signed)
Too soon 06/28/22 #90 1 RF  Requested Prescriptions  Refused Prescriptions Disp Refills   busPIRone (BUSPAR) 5 MG tablet [Pharmacy Med Name: BUSPIRONE HCL 5 MG TABLET] 270 tablet 1    Sig: TAKE 1 TABLET (5 MG TOTAL) BY MOUTH 3 (THREE) TIMES DAILY AS NEEDED (ANXIETY PANIC).     Psychiatry: Anxiolytics/Hypnotics - Non-controlled Passed - 07/25/2022  8:33 AM      Passed - Valid encounter within last 12 months    Recent Outpatient Visits           3 weeks ago Controlled type 2 diabetes mellitus with neuropathy [facility])   [place], DO   1 month ago Cellulitis of right lower extremity   [place], DO   4 months ago Weakness of both lower extremities   [place], DO   6 months ago Failure to thrive in adult   [place], DO   6 months ago Controlled type 2 diabetes mellitus with neuropathy [facility])   [place], [place]

## 2022-07-31 ENCOUNTER — Ambulatory Visit
Admission: RE | Admit: 2022-07-31 | Discharge: 2022-07-31 | Disposition: A | Discharge status: Home / Self Care | Payer: Medicare Other | Source: Ambulatory Visit | Attending: Physician Assistant | Admitting: Physician Assistant

## 2022-07-31 DIAGNOSIS — R29898 Other symptoms and signs involving the musculoskeletal system: Secondary | ICD-10-CM | POA: Insufficient documentation

## 2022-07-31 DIAGNOSIS — G3184 Mild cognitive impairment, so stated: Secondary | ICD-10-CM | POA: Insufficient documentation

## 2022-08-02 ENCOUNTER — Other Ambulatory Visit: Payer: Self-pay | Admitting: Gastroenterology

## 2022-08-02 DIAGNOSIS — R194 Change in bowel habit: Secondary | ICD-10-CM

## 2022-08-02 DIAGNOSIS — R1084 Generalized abdominal pain: Secondary | ICD-10-CM

## 2022-08-02 DIAGNOSIS — R1013 Epigastric pain: Secondary | ICD-10-CM

## 2022-08-02 DIAGNOSIS — R197 Diarrhea, unspecified: Secondary | ICD-10-CM

## 2022-08-07 ENCOUNTER — Encounter: Payer: Self-pay | Admitting: Internal Medicine

## 2022-08-07 ENCOUNTER — Other Ambulatory Visit: Payer: Self-pay | Admitting: Gastroenterology

## 2022-08-07 DIAGNOSIS — R1084 Generalized abdominal pain: Secondary | ICD-10-CM

## 2022-08-07 DIAGNOSIS — R194 Change in bowel habit: Secondary | ICD-10-CM

## 2022-08-07 DIAGNOSIS — R634 Abnormal weight loss: Secondary | ICD-10-CM

## 2022-08-07 DIAGNOSIS — R1013 Epigastric pain: Secondary | ICD-10-CM

## 2022-08-07 DIAGNOSIS — R197 Diarrhea, unspecified: Secondary | ICD-10-CM

## 2022-08-09 ENCOUNTER — Emergency Department: Payer: Medicare Other

## 2022-08-09 ENCOUNTER — Inpatient Hospital Stay
Admission: EM | Admit: 2022-08-09 | Discharge: 2022-08-16 | DRG: 441 | Disposition: E | Payer: Medicare Other | Attending: Internal Medicine | Admitting: Internal Medicine

## 2022-08-09 ENCOUNTER — Other Ambulatory Visit: Payer: Self-pay

## 2022-08-09 DIAGNOSIS — R140 Abdominal distension (gaseous): Secondary | ICD-10-CM | POA: Diagnosis not present

## 2022-08-09 DIAGNOSIS — E8809 Other disorders of plasma-protein metabolism, not elsewhere classified: Secondary | ICD-10-CM | POA: Diagnosis present

## 2022-08-09 DIAGNOSIS — K766 Portal hypertension: Secondary | ICD-10-CM | POA: Diagnosis present

## 2022-08-09 DIAGNOSIS — R188 Other ascites: Secondary | ICD-10-CM | POA: Diagnosis not present

## 2022-08-09 DIAGNOSIS — F411 Generalized anxiety disorder: Secondary | ICD-10-CM | POA: Diagnosis present

## 2022-08-09 DIAGNOSIS — Z6822 Body mass index (BMI) 22.0-22.9, adult: Secondary | ICD-10-CM

## 2022-08-09 DIAGNOSIS — I10 Essential (primary) hypertension: Secondary | ICD-10-CM | POA: Diagnosis present

## 2022-08-09 DIAGNOSIS — R64 Cachexia: Secondary | ICD-10-CM | POA: Diagnosis present

## 2022-08-09 DIAGNOSIS — R634 Abnormal weight loss: Secondary | ICD-10-CM | POA: Diagnosis not present

## 2022-08-09 DIAGNOSIS — E785 Hyperlipidemia, unspecified: Secondary | ICD-10-CM | POA: Diagnosis present

## 2022-08-09 DIAGNOSIS — E871 Hypo-osmolality and hyponatremia: Secondary | ICD-10-CM | POA: Insufficient documentation

## 2022-08-09 DIAGNOSIS — F331 Major depressive disorder, recurrent, moderate: Secondary | ICD-10-CM | POA: Diagnosis present

## 2022-08-09 DIAGNOSIS — K652 Spontaneous bacterial peritonitis: Secondary | ICD-10-CM | POA: Diagnosis present

## 2022-08-09 DIAGNOSIS — R791 Abnormal coagulation profile: Secondary | ICD-10-CM | POA: Diagnosis present

## 2022-08-09 DIAGNOSIS — K767 Hepatorenal syndrome: Secondary | ICD-10-CM | POA: Diagnosis present

## 2022-08-09 DIAGNOSIS — K7031 Alcoholic cirrhosis of liver with ascites: Secondary | ICD-10-CM | POA: Diagnosis not present

## 2022-08-09 DIAGNOSIS — N179 Acute kidney failure, unspecified: Secondary | ICD-10-CM | POA: Insufficient documentation

## 2022-08-09 DIAGNOSIS — Z720 Tobacco use: Secondary | ICD-10-CM | POA: Diagnosis present

## 2022-08-09 DIAGNOSIS — E872 Acidosis, unspecified: Secondary | ICD-10-CM | POA: Diagnosis present

## 2022-08-09 DIAGNOSIS — K7460 Unspecified cirrhosis of liver: Secondary | ICD-10-CM

## 2022-08-09 DIAGNOSIS — D696 Thrombocytopenia, unspecified: Secondary | ICD-10-CM | POA: Diagnosis present

## 2022-08-09 DIAGNOSIS — Z79899 Other long term (current) drug therapy: Secondary | ICD-10-CM

## 2022-08-09 DIAGNOSIS — Z7984 Long term (current) use of oral hypoglycemic drugs: Secondary | ICD-10-CM

## 2022-08-09 DIAGNOSIS — Z66 Do not resuscitate: Secondary | ICD-10-CM | POA: Diagnosis not present

## 2022-08-09 DIAGNOSIS — E1140 Type 2 diabetes mellitus with diabetic neuropathy, unspecified: Secondary | ICD-10-CM | POA: Diagnosis present

## 2022-08-09 DIAGNOSIS — K219 Gastro-esophageal reflux disease without esophagitis: Secondary | ICD-10-CM | POA: Diagnosis present

## 2022-08-09 DIAGNOSIS — E875 Hyperkalemia: Secondary | ICD-10-CM | POA: Diagnosis present

## 2022-08-09 DIAGNOSIS — R34 Anuria and oliguria: Secondary | ICD-10-CM | POA: Diagnosis present

## 2022-08-09 DIAGNOSIS — R601 Generalized edema: Secondary | ICD-10-CM | POA: Diagnosis present

## 2022-08-09 DIAGNOSIS — R54 Age-related physical debility: Secondary | ICD-10-CM | POA: Diagnosis present

## 2022-08-09 DIAGNOSIS — E1169 Type 2 diabetes mellitus with other specified complication: Secondary | ICD-10-CM | POA: Diagnosis present

## 2022-08-09 DIAGNOSIS — R319 Hematuria, unspecified: Secondary | ICD-10-CM | POA: Diagnosis present

## 2022-08-09 DIAGNOSIS — R109 Unspecified abdominal pain: Secondary | ICD-10-CM | POA: Diagnosis present

## 2022-08-09 DIAGNOSIS — I959 Hypotension, unspecified: Secondary | ICD-10-CM | POA: Diagnosis present

## 2022-08-09 DIAGNOSIS — D649 Anemia, unspecified: Secondary | ICD-10-CM | POA: Diagnosis present

## 2022-08-09 DIAGNOSIS — R1084 Generalized abdominal pain: Secondary | ICD-10-CM | POA: Diagnosis not present

## 2022-08-09 DIAGNOSIS — Z888 Allergy status to other drugs, medicaments and biological substances status: Secondary | ICD-10-CM

## 2022-08-09 DIAGNOSIS — R400 Somnolence: Secondary | ICD-10-CM | POA: Diagnosis not present

## 2022-08-09 DIAGNOSIS — K7210 Chronic hepatic failure without coma: Secondary | ICD-10-CM | POA: Diagnosis present

## 2022-08-09 DIAGNOSIS — F17210 Nicotine dependence, cigarettes, uncomplicated: Secondary | ICD-10-CM | POA: Diagnosis present

## 2022-08-09 LAB — CBC
HCT: 28.3 % — ABNORMAL LOW (ref 39.0–52.0)
Hemoglobin: 9 g/dL — ABNORMAL LOW (ref 13.0–17.0)
MCH: 30.7 pg (ref 26.0–34.0)
MCHC: 31.8 g/dL (ref 30.0–36.0)
MCV: 96.6 fL (ref 80.0–100.0)
Platelets: 189 10*3/uL (ref 150–400)
RBC: 2.93 MIL/uL — ABNORMAL LOW (ref 4.22–5.81)
RDW: 15.7 % — ABNORMAL HIGH (ref 11.5–15.5)
WBC: 12.5 10*3/uL — ABNORMAL HIGH (ref 4.0–10.5)
nRBC: 0 % (ref 0.0–0.2)

## 2022-08-09 LAB — COMPREHENSIVE METABOLIC PANEL
ALT: 7 U/L (ref 0–44)
AST: 13 U/L — ABNORMAL LOW (ref 15–41)
Albumin: 2.1 g/dL — ABNORMAL LOW (ref 3.5–5.0)
Alkaline Phosphatase: 135 U/L — ABNORMAL HIGH (ref 38–126)
Anion gap: 12 (ref 5–15)
BUN: 55 mg/dL — ABNORMAL HIGH (ref 6–20)
CO2: 17 mmol/L — ABNORMAL LOW (ref 22–32)
Calcium: 8.5 mg/dL — ABNORMAL LOW (ref 8.9–10.3)
Chloride: 101 mmol/L (ref 98–111)
Creatinine, Ser: 3.45 mg/dL — ABNORMAL HIGH (ref 0.61–1.24)
GFR, Estimated: 20 mL/min — ABNORMAL LOW (ref >60)
Glucose, Bld: 93 mg/dL (ref 70–99)
Potassium: 4.8 mmol/L (ref 3.5–5.1)
Sodium: 130 mmol/L — ABNORMAL LOW (ref 135–145)
Total Bilirubin: 0.9 mg/dL (ref 0.3–1.2)
Total Protein: 5.6 g/dL — ABNORMAL LOW (ref 6.5–8.1)

## 2022-08-09 LAB — PROCALCITONIN: Procalcitonin: 0.79 ng/mL

## 2022-08-09 LAB — PROTIME-INR
INR: 1.5 — ABNORMAL HIGH (ref 0.8–1.2)
Prothrombin Time: 17.5 seconds — ABNORMAL HIGH (ref 11.4–15.2)

## 2022-08-09 LAB — BODY FLUID CELL COUNT WITH DIFFERENTIAL
Lymphs, Fluid: 4 %
Monocyte-Macrophage-Serous Fluid: 6 %
Neutrophil Count, Fluid: 90 %
Total Nucleated Cell Count, Fluid: 1960 cu mm

## 2022-08-09 LAB — LIPASE, BLOOD: Lipase: 24 U/L (ref 11–51)

## 2022-08-09 LAB — HIV ANTIBODY (ROUTINE TESTING W REFLEX): HIV Screen 4th Generation wRfx: NONREACTIVE

## 2022-08-09 LAB — LACTIC ACID, PLASMA: Lactic Acid, Venous: 1.2 mmol/L (ref 0.5–1.9)

## 2022-08-09 LAB — MRSA NEXT GEN BY PCR, NASAL: MRSA by PCR Next Gen: NOT DETECTED

## 2022-08-09 MED ORDER — HEPARIN SODIUM (PORCINE) 5000 UNIT/ML IJ SOLN: 5000.0000 [IU] | Freq: Three times a day (TID) | INTRAMUSCULAR | Status: DC

## 2022-08-09 MED ORDER — ALBUMIN HUMAN 25 % IV SOLN: 12.5000 g | INTRAVENOUS | Status: DC | PRN

## 2022-08-09 MED ORDER — SODIUM CHLORIDE 0.9 % IV BOLUS
500.0000 mL | Freq: Once | INTRAVENOUS | Status: AC
  Administered 2022-08-09: 500 mL via INTRAVENOUS

## 2022-08-09 MED ORDER — ROPINIROLE HCL 1 MG PO TABS
1.0000 mg | ORAL_TABLET | Freq: Two times a day (BID) | ORAL | Status: DC
  Administered 2022-08-09 – 2022-08-12 (×6): 1 mg via ORAL
  Filled 2022-08-09 (×7): qty 1

## 2022-08-09 MED ORDER — LIDOCAINE HCL (PF) 1 % IJ SOLN
5.0000 mL | Freq: Once | INTRAMUSCULAR | Status: AC
  Administered 2022-08-09: 5 mL
  Filled 2022-08-09: qty 5

## 2022-08-09 MED ORDER — LORAZEPAM 2 MG/ML IJ SOLN: 2.0000 mg | INTRAMUSCULAR | Status: DC | PRN

## 2022-08-09 MED ORDER — ALBUMIN HUMAN 25 % IV SOLN
50.0000 g | Freq: Once | INTRAVENOUS | Status: AC
  Administered 2022-08-09: 50 g via INTRAVENOUS
  Filled 2022-08-09: qty 200

## 2022-08-09 MED ORDER — LORAZEPAM 2 MG/ML IJ SOLN
1.0000 mg | Freq: Once | INTRAMUSCULAR | Status: AC
  Administered 2022-08-09: 1 mg via INTRAVENOUS
  Filled 2022-08-09: qty 1

## 2022-08-09 MED ORDER — ONDANSETRON HCL 4 MG/2ML IJ SOLN: 4.0000 mg | Freq: Four times a day (QID) | INTRAMUSCULAR | Status: AC | PRN

## 2022-08-09 MED ORDER — SODIUM CHLORIDE 0.9 % IV SOLN
50.0000 ug/h | INTRAVENOUS | Status: DC
  Administered 2022-08-09 – 2022-08-13 (×9): 50 ug/h via INTRAVENOUS
  Filled 2022-08-09 (×10): qty 1

## 2022-08-09 MED ORDER — LORAZEPAM 1 MG PO TABS
1.0000 mg | ORAL_TABLET | Freq: Four times a day (QID) | ORAL | Status: DC | PRN
  Administered 2022-08-12: 1 mg via ORAL
  Filled 2022-08-09: qty 1

## 2022-08-09 MED ORDER — METRONIDAZOLE 500 MG/100ML IV SOLN
500.0000 mg | Freq: Two times a day (BID) | INTRAVENOUS | Status: DC
  Administered 2022-08-09: 500 mg via INTRAVENOUS
  Filled 2022-08-09 (×2): qty 100

## 2022-08-09 MED ORDER — VANCOMYCIN VARIABLE DOSE PER UNSTABLE RENAL FUNCTION (PHARMACIST DOSING): Status: DC

## 2022-08-09 MED ORDER — MIDODRINE HCL 5 MG PO TABS: 7.5000 mg | ORAL_TABLET | Freq: Once | ORAL | Status: DC

## 2022-08-09 MED ORDER — HYDRALAZINE HCL 20 MG/ML IJ SOLN: 5.0000 mg | Freq: Three times a day (TID) | INTRAMUSCULAR | Status: AC | PRN

## 2022-08-09 MED ORDER — ONDANSETRON HCL 4 MG PO TABS: 4.0000 mg | ORAL_TABLET | Freq: Four times a day (QID) | ORAL | Status: AC | PRN

## 2022-08-09 MED ORDER — SENNOSIDES-DOCUSATE SODIUM 8.6-50 MG PO TABS: 1.0000 | ORAL_TABLET | Freq: Every evening | ORAL | Status: DC | PRN

## 2022-08-09 MED ORDER — ACETAMINOPHEN 325 MG PO TABS: 650.0000 mg | ORAL_TABLET | Freq: Four times a day (QID) | ORAL | Status: AC | PRN

## 2022-08-09 MED ORDER — ZOLPIDEM TARTRATE 5 MG PO TABS: 5.0000 mg | ORAL_TABLET | Freq: Every evening | ORAL | Status: DC | PRN

## 2022-08-09 MED ORDER — FENTANYL CITRATE PF 50 MCG/ML IJ SOSY: 25.0000 ug | PREFILLED_SYRINGE | INTRAMUSCULAR | Status: DC | PRN

## 2022-08-09 MED ORDER — ACETAMINOPHEN 650 MG RE SUPP: 650.0000 mg | Freq: Four times a day (QID) | RECTAL | Status: AC | PRN

## 2022-08-09 MED ORDER — NICOTINE 21 MG/24HR TD PT24
21.0000 mg | MEDICATED_PATCH | Freq: Every day | TRANSDERMAL | Status: DC | PRN
  Administered 2022-08-09: 21 mg via TRANSDERMAL
  Filled 2022-08-09: qty 1

## 2022-08-09 MED ORDER — MIDODRINE HCL 5 MG PO TABS
7.5000 mg | ORAL_TABLET | Freq: Three times a day (TID) | ORAL | Status: DC
  Administered 2022-08-10 – 2022-08-11 (×4): 7.5 mg via ORAL
  Filled 2022-08-09 (×4): qty 2

## 2022-08-09 MED ORDER — MORPHINE SULFATE (PF) 2 MG/ML IV SOLN
2.0000 mg | INTRAVENOUS | Status: DC | PRN
  Administered 2022-08-10 (×2): 2 mg via INTRAVENOUS
  Filled 2022-08-09 (×2): qty 1

## 2022-08-09 MED ORDER — VANCOMYCIN HCL 1500 MG/300ML IV SOLN
1500.0000 mg | Freq: Once | INTRAVENOUS | Status: AC
  Administered 2022-08-09: 1500 mg via INTRAVENOUS
  Filled 2022-08-09: qty 300

## 2022-08-09 MED ORDER — SODIUM CHLORIDE 0.9 % IV SOLN
2.0000 g | INTRAVENOUS | Status: DC
  Administered 2022-08-09: 2 g via INTRAVENOUS
  Filled 2022-08-09: qty 12.5

## 2022-08-09 NOTE — H&P (Addendum)
History and Physical   Alex Green [id number] DOB: 11/18/1965 DOA: 07/29/2022  PCP: Olin Hauser, DO  Patient coming from: home  I have personally briefly reviewed patient's old medical records in [place].  Chief Concern: abdominal pain  HPI: Alex Green is a 58 year old male with history of remote alcohol abuse, hypertension, non-insulin-dependent diabetes mellitus, generalized anxiety disorder, unintentional weight loss, who presents emergency department for chief concerns of distended abdomen.  Initial vitals in the ED showed temperature of 99.1, respiration rate of 21, heart rate of 80, blood pressure 102/69, SpO2 100% on room air.  Serum sodium is 130, potassium 4.8, chloride 101, bicarb 17, BUN of 55, serum creatinine of 3.45, GFR of 20, nonfasting blood glucose 93, WBC 12.5, hemoglobin 9.0, platelets of 189.  ED performed fecal occult, which was mildly positive.  ED treatment: Sodium chloride 500 mL bolus one-time dose. --------------------------- At bedside, he is able to tell me his name, age, current year, current location.  He reports swelling and distention of is belly for the last 2-3 weeks. He denies fever at home. He endorses generalized diffused abdominal pain, that is dull. He reprots the last time he drank was about 2-3 years ago. He was a heavy drinker and then quit.  He endorses unintentional weight loss of over 100 pounds in the last year.  He reports the pain is at least an 8/10, persistent.   Social history: He lives with his girlfriend. He endorses tobacco use, 1.5 packs per day. He denies etoh, recreational drug use currently. He is disabled and formerly worked in [employer]  ROS: Constitutional: + weight change, no fever ENT/Mouth: no sore throat, no rhinorrhea Eyes: no eye pain, no vision changes Cardiovascular: no chest pain, + dyspnea,  + edema, no palpitations Respiratory: no cough, no sputum, no  wheezing Gastrointestinal: no nausea, no vomiting, no diarrhea, no constipation Genitourinary: no urinary incontinence, no dysuria, no hematuria Musculoskeletal: no arthralgias, no myalgias Skin: no skin lesions, no pruritus, Neuro: + weakness, no loss of consciousness, no syncope Psych: no anxiety, no depression, + decrease appetite Heme/Lymph: no bruising, no bleeding  ED Course: Discussed with emergency medicine provider, patient requiring hospitalization for chief concerns of abdominal distention.  Assessment/Plan  Principal Problem:   Hepatorenal syndrome (HCC) Active Problems:   Essential hypertension   GAD (generalized anxiety disorder)   GERD (gastroesophageal reflux disease)   Tobacco abuse   Hyperlipidemia associated with type 2 diabetes mellitus (HCC)   Major depressive disorder, recurrent, moderate (HCC)   Abdominal pain   Unintentional weight loss   Hyponatremia   AKI (acute kidney injury) ([place])   Alcoholic cirrhosis of liver with ascites (HCC)   SBP (spontaneous bacterial peritonitis) ([place])   Assessment and Plan:  * Hepatorenal syndrome (HCC) - Albumin 50 g IV one-time dose, octreotide continuous ordered, midodrine 7.5 mg p.o. 3 times daily ordered - Check hepatic function levels, INR, to assess for MELD NA score, I suspect this will be high - Guarded/poor prognosis - Discussed with cross coverage provider - Admit to stepdown, inpatient  SBP (spontaneous bacterial peritonitis) ([place]) - Diagnostic paracentesis in process - Pathology consulted/ordered to assess for hepatocarcinoma - Vancomycin, cefepime, metronidazole ordered  AKI (acute kidney injury) ([place]) - Suspect secondary to hepatorenal syndrome - Admit to stepdown  Hyponatremia With elevated kidney injury - Concerning for high MELD-Na score - Check liver function in the a.m., INR in a.m. - Repeat BMP in a.m.  Unintentional weight loss With  history of alcohol abuse along with liver cirrhosis on  ultrasound - There is concern for carcinoma however a CT of the abdomen and pelvis with contrast needs to be avoided at this time given acute kidney injury - AM team to order CT abdomen pelvis with contrast when the benefits outweigh the risk - Check HIV level  Abdominal pain Suspect secondary SBP in setting of abdominal ascites and patient with liver cirrhosis - Discussed with emergency medicine provider for bedside paracentesis to obtain blood culture sent, with cell count and pathology evaluation for possible carcinoma - Blood cultures x 2, lactic acid times two ordered - Cefepime and vancomycin ordered - Admit to telemetry cardiac, inpatient  Hyperlipidemia associated with type 2 diabetes mellitus ([place]) - Patient is no longer on home antiglycemic agents due to significant weight loss  Tobacco abuse - As needed nicotine patch ordered  Essential hypertension - No beta-blockade/calcium channel blocker - Hydralazine 5 mg IV every 8 hours as needed for SBP greater than 175, 4 days ordered  Chart reviewed.   DVT prophylaxis: am to order. Pharmacologic dvt prophylaxis not started on admission. Am team to order when appropriate Code Status: full code Diet: heart healthy Family Communication: Updated his significant other, [employer] with patient's permission Disposition Plan: poor prognosis Consults called: palliative, IR for US guided paracentesis Admission status: PCU  Past Medical History:  Diagnosis Date   Alcoholism ([place])    Anemia    Anxiety    GERD (gastroesophageal reflux disease)    Hypertension    Neuropathy    Past Surgical History:  Procedure Laterality Date   ANKLE SURGERY     Social History:  reports that he has been smoking cigarettes. He has a 15.00 pack-year smoking history. He has never used smokeless tobacco. He reports current alcohol use of about 50.0 standard drinks of alcohol per week. He reports that he does not currently use drugs.  Allergies   Allergen Reactions   Other     "Bioxin gives me a yeast infection"   History reviewed. No pertinent family history. Family history: Family history reviewed and not pertinent  Prior to Admission medications   Medication Sig Start Date End Date Taking? Authorizing Provider  busPIRone (BUSPAR) 5 MG tablet Take 1 tablet (5 mg total) by mouth 3 (three) times daily as needed (anxiety panic). 06/28/22   [name], [name], DO  clotrimazole-betamethasone (LOTRISONE) cream Apply 2 times a day for worsening flare dry skin dermatitis of toes/feet, may re-use daily up to 1 week as needed. 01/02/22   [name], [name], DO  erythromycin ophthalmic ointment Place 1 Application into both eyes at bedtime as needed. 06/28/22   [name], Alexander J, DO  fluticasone (FLONASE) 50 MCG/ACT nasal spray PLACE 2 SPRAYS INTO BOTH NOSTRILS DAILY FOR 4-6 WEEKS THEN STOP AND USE SEASONALLY OR AS NEEDED. 01/11/21   Parks Ranger, [name], DO  levETIRAcetam (KEPPRA) 500 MG tablet Take 1 tablet (500 mg total) by mouth 2 (two) times daily for 7 days. 09/06/20 09/13/20  Arta Silence, MD  levETIRAcetam (KEPPRA) 500 MG tablet levetiracetam 500 mg tablet  TAKE 1 TABLET BY MOUTH TWICE A DAY FOR 7 DAYS Patient not taking: Reported on 10/05/2021    [provider]  mirtazapine (REMERON) 15 MG tablet TAKE 1 TABLET BY MOUTH EVERYDAY AT BEDTIME 04/29/22   [name], Alexander J, DO  ondansetron (ZOFRAN-ODT) 4 MG disintegrating tablet TAKE 1 TABLET BY MOUTH EVERY 8 HOURS AS NEEDED FOR NAUSEA AND VOMITING 06/03/22  [name], Alexander J, DO  rOPINIRole (REQUIP) 1 MG tablet Take 1 tablet (1 mg total) by mouth in the morning and at bedtime. 06/13/22   [name], [name], DO  zolpidem (AMBIEN CR) 12.5 MG CR tablet Take 1 tablet (12.5 mg total) by mouth at bedtime as needed for sleep. 03/27/22   Olin Hauser, DO   Physical Exam: Vitals:   07/23/2022 1815 07/22/2022 1830 08/06/2022 1845 07/30/2022  1900  BP: 106/74 109/78 111/77 120/79  Pulse: 83 86 84 82  Resp: (!) 23 (!) 24  (!) 21  Temp:      TempSrc:      SpO2: 100% 100% 100% 100%  Weight:      Height:       Constitutional: appears frail, cachectic, NAD, calm, comfortable Eyes: PERRL, lids and conjunctivae normal ENMT: Mucous membranes are moist. Posterior pharynx clear of any exudate or lesions. Age-appropriate dentition. Hearing appropriate Neck: normal, supple, no masses, no thyromegaly Respiratory: clear to auscultation bilaterally, no wheezing, no crackles. Normal respiratory effort. No accessory muscle use.  Cardiovascular: Regular rate and rhythm, no murmurs / rubs / gallops. Bilateral lower extremity edema. 2+ pedal pulses. No carotid bruits.  Anasarca Abdomen: Distended abdomen, +generalized diffused tenderness, no masses palpated, no hepatosplenomegaly. Bowel sounds positive.  Musculoskeletal: no clubbing / cyanosis. No joint deformity upper and lower extremities. Good ROM, no contractures, no atrophy. Normal muscle tone.  Skin: no rashes, lesions, ulcers. No induration.  Jaundiced Neurologic: Sensation intact. Strength 5/5 in all 4.  Psychiatric: Normal judgment and insight. Alert and oriented x 3. Depressed mood.   EKG: independently reviewed, showing atrial flutter with rate of 79, QTc 430  Chest x-ray on Admission: I personally reviewed and I agree with radiologist reading as below.  US ABDOMEN LIMITED RUQ (LIVER/GB)  Result Date: 08/07/2022 CLINICAL DATA:  Distension EXAM: ULTRASOUND ABDOMEN LIMITED RIGHT UPPER QUADRANT COMPARISON:  None Available. FINDINGS: Gallbladder: Diffuse gallbladder wall thickening up to 8 mm. No gallstones or biliary sludge visualized. No sonographic Murphy sign noted by sonographer. Common bile duct: Diameter: 4 mm Liver: Heterogeneous liver echotexture with nodular surface contour. No liver lesion is identified sonographically. Portal vein is patent on color Doppler imaging with normal  direction of blood flow towards the liver. Other: Large volume ascites. IMPRESSION: 1. Cirrhotic morphology of the liver. No liver lesion is identified sonographically. 2. Large volume ascites. 3. Diffuse gallbladder wall thickening, favored to be secondary to liver disease. Electronically Signed   By: Davina Poke D.O.   On: 07/18/2022 15:23    Labs on Admission: I have personally reviewed following labs  CBC: Recent Labs  Lab 08/03/2022 1425  WBC 12.5*  HGB 9.0*  HCT 28.3*  MCV 96.6  PLT 99991111   Basic Metabolic Panel: Recent Labs  Lab 08/03/2022 1425  NA 130*  K 4.8  CL 101  CO2 17*  GLUCOSE 93  BUN 55*  CREATININE 3.45*  CALCIUM 8.5*   GFR: Estimated Creatinine Clearance: 22.6 mL/min (A) (by C-G formula based on SCr of 3.45 mg/dL (H)).  Liver Function Tests: Recent Labs  Lab 07/28/2022 1425  AST 13*  ALT 7  ALKPHOS 135*  BILITOT 0.9  PROT 5.6*  ALBUMIN 2.1*   Recent Labs  Lab 07/22/2022 1425  LIPASE 24   Coagulation Profile: Recent Labs  Lab 08/13/2022 1425  INR 1.5*   CRITICAL CARE Performed by: Dr. Tobie Poet  Total critical care time: 35 minutes  Critical care time was exclusive of separately  billable procedures and treating other patients.  Critical care was necessary to treat or prevent imminent or life-threatening deterioration.  Critical care was time spent personally by me on the following activities: development of treatment plan with patient and/or surrogate as well as nursing, discussions with consultants, evaluation of patient's response to treatment, examination of patient, obtaining history from patient or surrogate, ordering and performing treatments and interventions, ordering and review of laboratory studies, ordering and review of radiographic studies, pulse oximetry and re-evaluation of patient's condition.  This document was prepared using Dragon Voice Recognition software and may include unintentional dictation errors.  Dr. Tobie Poet [facility]  If 7PM-7AM, please contact overnight-coverage provider If 7AM-7PM, please contact day coverage provider [email or web address]  07/19/2022, 7:15 PM

## 2022-08-09 NOTE — ED Notes (Signed)
Informed rn bed assigned

## 2022-08-09 NOTE — Assessment & Plan Note (Signed)
-   Patient is no longer on home antiglycemic agents due to significant weight loss

## 2022-08-09 NOTE — Assessment & Plan Note (Addendum)
-   No beta-blockade/calcium channel blocker - Hydralazine 5 mg IV every 8 hours as needed for SBP greater than 175, 4 days ordered

## 2022-08-09 NOTE — ED Provider Notes (Addendum)
[facility] Provider Note    Event Date/Time   First MD Initiated Contact with Patient 08/12/2022 1427     (approximate)  History   Chief Complaint: Abdominal Pain  HPI  Alex Green is a 57 y.o. male with a past medical history of remote alcoholism, as well as anemia anxiety gastric reflux and hypertension presents to the emergency department for worsening abdominal distention.  According to the patient for the past 2 to 3 weeks his abdomen has become distended and he is feeling short of breath at times.  Patient denies any fever states some discomfort due to abdominal distention but no significant abdominal pain.  Patient states a history of alcohol use many years ago but no longer drinks alcohol.  Noted to be mildly hypotensive 88/64.  Patient states his blood pressure is always low.  Physical Exam   Triage Vital Signs: ED Triage Vitals  Enc Vitals Group     BP 08/12/2022 1427 92/60     Pulse Rate 08/13/2022 1427 82     Resp 07/25/2022 1427 (!) 22     Temp 08/05/2022 1432 99.1 F (37.3 C)     Temp Source 07/29/2022 1432 Oral     SpO2 08/04/2022 1427 100 %     Weight 08/04/2022 1423 152 lb 11.2 oz (69.3 kg)     Height 07/27/2022 1423 '5\' 8"'$  (1.727 m)     Head Circumference --      Peak Flow --      Pain Score 07/19/2022 1429 10     Pain Loc --      Pain Edu? --      Excl. in [place]? --     Most recent vital signs: Vitals:   08/11/2022 1430 08/13/2022 1432  BP: (!) 88/64   Pulse: 80   Resp: (!) 24   Temp:  99.1 F (37.3 C)  SpO2: 100%     General: Awake, no distress.  CV:  Good peripheral perfusion.  Regular rate and rhythm  Resp:  Normal effort.  Equal breath sounds bilaterally.  Abd:  No distention.  Moderate distention, largely dull to percussion suspect more ascites.   ED Results / Procedures / Treatments   EKG  EKG viewed and interpreted by myself shows what appears to be sinus versus atrial fibrillation at 79 bpm with a widened QRS, normal axis,  largely normal intervals nonspecific ST changes.  RADIOLOGY  Ultrasound concerning for cirrhosis with large volume ascites. CT pending   MEDICATIONS ORDERED IN ED: Medications  sodium chloride 0.9 % bolus 500 mL (has no administration in time range)     IMPRESSION / MDM / ASSESSMENT AND PLAN / ED COURSE  I reviewed the triage vital signs and the nursing notes.  Patient's presentation is most consistent with acute presentation with potential threat to life or bodily function.  Patient presents emergency department for worsening abdominal distention.  Physical exam is concerning for ascites slight jaundice discoloration to the skin versus anemic/pale.  We will check labs including LFTs and INR we will obtain CT imaging of abdomen and pelvis we will obtain a chest x-ray and continue to closely monitor.  Reassuringly patient has a benign abdomen no concern for SBP, afebrile.  Lab work shows anemia with a hemoglobin of 9.0.  Stool exam shows light brown very faintly guaiac positive, but not significantly so.  Patient's chemistry shows reassuring liver enzymes with a creatinine of 3.45 and a GFR of 20.  Consistent  with acute renal failure patient will require admission to the hospital service for further workup and treatment.  INR 1.5 possibly indicating liver disease.    Ultrasound has resulting showing cirrhosis of the liver.  Large volume ascites.  CT scan pending.  Patient require admission after workup, patient care signed out to oncoming provider.  FINAL CLINICAL IMPRESSION(S) / ED DIAGNOSES   Abdominal distention Ascites Acute renal failure   Note:  This document was prepared using Dragon voice recognition software and may include unintentional dictation errors.   Harvest Dark, MD 08/12/2022 1444    Harvest Dark, MD 08/10/2022 [phone]

## 2022-08-09 NOTE — Assessment & Plan Note (Addendum)
-   Diagnostic paracentesis in process - Pathology consulted/ordered to assess for hepatocarcinoma - Vancomycin, cefepime, metronidazole ordered

## 2022-08-09 NOTE — Assessment & Plan Note (Signed)
-   As needed nicotine patch ordered 

## 2022-08-09 NOTE — ED Triage Notes (Signed)
Patient reports abdominal distention for 2 weeks; has been seen by PCP but unsure of results.

## 2022-08-09 NOTE — Hospital Course (Addendum)
Alex Green is a 57 year old male with history of remote alcohol abuse, hypertension, non-insulin-dependent diabetes mellitus, generalized anxiety disorder, unintentional weight loss, who presents emergency department for chief concerns of distended abdomen.  Initial vitals in the ED showed temperature of 99.1, respiration rate of 21, heart rate of 80, blood pressure 102/69, SpO2 100% on room air.  Serum sodium is 130, potassium 4.8, chloride 101, bicarb 17, BUN of 55, serum creatinine of 3.45, GFR of 20, nonfasting blood glucose 93, WBC 12.5, hemoglobin 9.0, platelets of 189.  ED performed fecal occult, which was mildly positive.  ED treatment: Sodium chloride 500 mL bolus one-time dose.

## 2022-08-09 NOTE — ED Provider Notes (Signed)
Dr. Tobie Poet with the hospitalist service evaluated patient for admission. Did have concern for possible SBP. On my exam patient does have diffuse abdominal tenderness. Discussed risks/benefits of diagnostic paracentesis with patient. He agreed to have the procedure performed.   Diagnostic Paracentesis Performed by: Nance Pear Consent obtained. Required items: required blood products, implants, devices, and special equipment available Patient identity confirmed: verbally with patient Time out: Immediately prior to procedure a "time out" was called to verify the correct patient, procedure, equipment, support staff and site/side marked as required. Preparation: Patient was prepped and draped in the usual sterile fashion. Patient tolerance: Patient tolerated the procedure well with no immediate complications.  Location of paracentesis: Right lower quadrant Amount drained: 50 cc      Nance Pear, MD 08/12/2022 [phone]

## 2022-08-09 NOTE — Assessment & Plan Note (Addendum)
With history of alcohol abuse along with liver cirrhosis on ultrasound - There is concern for carcinoma however a CT of the abdomen and pelvis with contrast needs to be avoided at this time given acute kidney injury - AM team to order CT abdomen pelvis with contrast when the benefits outweigh the risk - Check HIV level

## 2022-08-09 NOTE — Assessment & Plan Note (Addendum)
With elevated kidney injury - Concerning for high MELD-Na score - Check liver function in the a.m., INR in a.m. - Repeat BMP in a.m.

## 2022-08-09 NOTE — Assessment & Plan Note (Signed)
-   Suspect secondary to hepatorenal syndrome - Admit to stepdown

## 2022-08-09 NOTE — Consult Note (Addendum)
Pharmacy Antibiotic Note  Alex Green is a 57 y.o. male admitted on 07/25/2022 with sepsis.  Pharmacy has been consulted for cefepime and vancomycin dosing.  Plan: Vancomycin '1500mg'$  IV x1 followed by variable dosing protocol given acute kidney injury Check vancomycin random level tomorrow morning.  Cefepime 2g IV every 24 hours Continue to monitor and dose adjust antibiotics according to renal function and indication   Height: '5\' 8"'$  (172.7 cm) Weight: 69.3 kg (152 lb 11.2 oz) IBW/kg (Calculated) : 68.4  Temp (24hrs), Avg:99.1 F (37.3 C), Min:99.1 F (37.3 C), Max:99.1 F (37.3 C)  Recent Labs  Lab 07/26/2022 1425  WBC 12.5*  CREATININE 3.45*    Estimated Creatinine Clearance: 22.6 mL/min (A) (by C-G formula based on SCr of 3.45 mg/dL (H)).    Allergies  Allergen Reactions   Other     "Bioxin gives me a yeast infection"    Antimicrobials this admission: 2/23 vancomycin >>  2/23 cefepime >>    Microbiology results: 2/23 BCx: sent 2/23 Body fluid cx: sent  Thank you for allowing pharmacy to be a part of this patient's care.  Darrick Penna 07/21/2022 5:00 PM

## 2022-08-09 NOTE — Assessment & Plan Note (Signed)
Suspect secondary SBP in setting of abdominal ascites and patient with liver cirrhosis - Discussed with emergency medicine provider for bedside paracentesis to obtain blood culture sent, with cell count and pathology evaluation for possible carcinoma - Blood cultures x 2, lactic acid times two ordered - Cefepime and vancomycin ordered - Admit to telemetry cardiac, inpatient

## 2022-08-09 NOTE — Assessment & Plan Note (Addendum)
-   Albumin 50 g IV one-time dose, octreotide continuous ordered, midodrine 7.5 mg p.o. 3 times daily ordered - Check hepatic function levels, INR, to assess for MELD NA score, I suspect this will be high - Guarded/poor prognosis - Discussed with cross coverage provider - Admit to stepdown, inpatient

## 2022-08-10 DIAGNOSIS — K767 Hepatorenal syndrome: Secondary | ICD-10-CM | POA: Diagnosis not present

## 2022-08-10 LAB — HEPATIC FUNCTION PANEL
ALT: 10 U/L (ref 0–44)
AST: 36 U/L (ref 15–41)
Albumin: 2.9 g/dL — ABNORMAL LOW (ref 3.5–5.0)
Alkaline Phosphatase: 205 U/L — ABNORMAL HIGH (ref 38–126)
Bilirubin, Direct: 0.5 mg/dL — ABNORMAL HIGH (ref 0.0–0.2)
Indirect Bilirubin: 0.9 mg/dL (ref 0.3–0.9)
Total Bilirubin: 1.4 mg/dL — ABNORMAL HIGH (ref 0.3–1.2)
Total Protein: 5.6 g/dL — ABNORMAL LOW (ref 6.5–8.1)

## 2022-08-10 LAB — URINALYSIS, COMPLETE (UACMP) WITH MICROSCOPIC
Bilirubin Urine: NEGATIVE
Glucose, UA: NEGATIVE mg/dL
Ketones, ur: NEGATIVE mg/dL
Leukocytes,Ua: NEGATIVE
Nitrite: NEGATIVE
Protein, ur: NEGATIVE mg/dL
Specific Gravity, Urine: 1.014 (ref 1.005–1.030)
pH: 5 (ref 5.0–8.0)

## 2022-08-10 LAB — BASIC METABOLIC PANEL
Anion gap: 12 (ref 5–15)
BUN: 53 mg/dL — ABNORMAL HIGH (ref 6–20)
CO2: 14 mmol/L — ABNORMAL LOW (ref 22–32)
Calcium: 8.3 mg/dL — ABNORMAL LOW (ref 8.9–10.3)
Chloride: 106 mmol/L (ref 98–111)
Creatinine, Ser: 3.23 mg/dL — ABNORMAL HIGH (ref 0.61–1.24)
GFR, Estimated: 21 mL/min — ABNORMAL LOW (ref >60)
Glucose, Bld: 74 mg/dL (ref 70–99)
Potassium: 4.8 mmol/L (ref 3.5–5.1)
Sodium: 132 mmol/L — ABNORMAL LOW (ref 135–145)

## 2022-08-10 LAB — CBC
HCT: 28 % — ABNORMAL LOW (ref 39.0–52.0)
Hemoglobin: 8.9 g/dL — ABNORMAL LOW (ref 13.0–17.0)
MCH: 30.9 pg (ref 26.0–34.0)
MCHC: 31.8 g/dL (ref 30.0–36.0)
MCV: 97.2 fL (ref 80.0–100.0)
Platelets: 144 10*3/uL — ABNORMAL LOW (ref 150–400)
RBC: 2.88 MIL/uL — ABNORMAL LOW (ref 4.22–5.81)
RDW: 15.8 % — ABNORMAL HIGH (ref 11.5–15.5)
WBC: 11.8 10*3/uL — ABNORMAL HIGH (ref 4.0–10.5)
nRBC: 0 % (ref 0.0–0.2)

## 2022-08-10 LAB — CREATININE, URINE, RANDOM: Creatinine, Urine: 96 mg/dL

## 2022-08-10 LAB — PROTIME-INR
INR: 1.7 — ABNORMAL HIGH (ref 0.8–1.2)
Prothrombin Time: 19.7 seconds — ABNORMAL HIGH (ref 11.4–15.2)

## 2022-08-10 LAB — SODIUM, URINE, RANDOM: Sodium, Ur: 14 mmol/L

## 2022-08-10 LAB — VANCOMYCIN, RANDOM: Vancomycin Rm: 17 ug/mL

## 2022-08-10 LAB — LACTIC ACID, PLASMA: Lactic Acid, Venous: 1.3 mmol/L (ref 0.5–1.9)

## 2022-08-10 MED ORDER — SODIUM CHLORIDE 0.9 % IV SOLN
2.0000 g | INTRAVENOUS | Status: DC
  Administered 2022-08-10 – 2022-08-14 (×5): 2 g via INTRAVENOUS
  Filled 2022-08-10: qty 20
  Filled 2022-08-10: qty 2
  Filled 2022-08-10 (×3): qty 20

## 2022-08-10 MED ORDER — CHLORHEXIDINE GLUCONATE CLOTH 2 % EX PADS
6.0000 | MEDICATED_PAD | Freq: Every day | CUTANEOUS | Status: DC
  Administered 2022-08-10 – 2022-08-14 (×5): 6 via TOPICAL

## 2022-08-10 MED ORDER — OXYCODONE HCL 5 MG PO TABS
5.0000 mg | ORAL_TABLET | ORAL | Status: DC | PRN
  Administered 2022-08-10 – 2022-08-11 (×2): 5 mg via ORAL
  Filled 2022-08-10 (×2): qty 1

## 2022-08-10 MED ORDER — HYDROMORPHONE HCL 1 MG/ML IJ SOLN: 0.5000 mg | INTRAMUSCULAR | Status: DC | PRN

## 2022-08-10 NOTE — Progress Notes (Signed)
PROGRESS NOTE    Alex Green  [id number] DOB: 03/05/1966 DOA: 07/18/2022 PCP: Olin Hauser, DO    Brief Narrative:  57 year old male with history of remote alcohol abuse, hypertension, non-insulin-dependent diabetes mellitus, generalized anxiety disorder, unintentional weight loss, who presents emergency department for chief concerns of distended abdomen.  He reports swelling and distention of is belly for the last 2-3 weeks. He denies fever at home. He endorses generalized diffused abdominal pain, that is dull. He reprots the last time he drank was about 2-3 years ago. He was a heavy drinker and then quit.   He endorses unintentional weight loss of over 100 pounds in the last year.   He reports the pain is at least an 8/10, persistent.   2/24: Patient remains hemodynamically stable.  No hemodynamically significant blood loss noted.  Creatinine improved.  Patient was retaining urine.  Foley placed.  850 cc urine liberated.  Stable for transfer to medical floor.  Assessment & Plan:   Principal Problem:   Hepatorenal syndrome ([place]) Active Problems:   Essential hypertension   GAD (generalized anxiety disorder)   GERD (gastroesophageal reflux disease)   Tobacco abuse   Hyperlipidemia associated with type 2 diabetes mellitus (HCC)   Major depressive disorder, recurrent, moderate (HCC)   Abdominal pain   Unintentional weight loss   Hyponatremia   AKI (acute kidney injury) ([place])   Alcoholic cirrhosis of liver with ascites (HCC)   SBP (spontaneous bacterial peritonitis) ([place])  Concern for hepatorenal syndrome Patient presented with AKI and decreased urine output.  Hepatorenal syndrome on differential.  Postobstructive uropathy in the setting of ascites also on differential.  Creatinine improved. Plan: Continue octreotide Continue midodrine Stable for transfer to medical floor Daily renal function Continue Foley catheter Monitor U OP If creatinine does not  improve we will involve nephrology for recommendations tomorrow  SBP (spontaneous bacterial peritonitis) ([place]) Diagnostic paracentesis with concern for spontaneous bacterial peritonitis.  Received vancomycin, cefepime, metronidazole. Plan: De-escalate to Rocephin monotherapy   AKI (acute kidney injury) ([place]) See above for plan Foley catheter in place   Hyponatremia With elevated kidney injury  Unintentional weight loss With history of alcohol abuse along with liver cirrhosis on ultrasound - There is concern for carcinoma however a CT of the abdomen and pelvis with contrast needs to be avoided at this time given acute kidney injury - Will need to pursue advanced imaging modality.  On hold now pending acute kidney injury.  If kidney function recovered will pursue CT abdomen pelvis with contrast versus MRI abdomen   Abdominal pain Suspect secondary SBP in setting of abdominal ascites and patient with liver cirrhosis Diagnostic paracentesis done in ED.  Concern for SBP.  On IV Rocephin as above.  Continue cardiac monitoring  Hyperlipidemia associated with type 2 diabetes mellitus ([place]) - Patient is no longer on home antiglycemic agents due to significant weight loss   Tobacco abuse - As needed nicotine patch ordered   Essential hypertension - No beta-blockade/calcium channel blocker - Hydralazine 5 mg IV every 8 hours as needed for SBP greater than 175, 4 days ordered   DVT prophylaxis: SCD Code Status: Full Family Communication:[facility] [phone] on 2/24 Disposition Plan: Status is: Inpatient Remains inpatient appropriate because: Decompensated hepatic cirrhosis   Level of care: Telemetry Medical  Consultants:  Palliative care  Procedures:  Paracentesis ordered  Antimicrobials: Rocephin    Subjective: Seen and examined.  Appears fatigued.  C/o abd pain  Objective: Vitals:   08/10/22  0600 08/10/22 0800 08/10/22 0900 08/10/22 1021  BP: 106/72 107/75  104/72 91/66  Pulse: 84 82 86 84  Resp: 20 (!) 23 (!) 22 18  Temp:  97.8 F (36.6 C)  98 F (36.7 C)  TempSrc:  Oral    SpO2: 98% 99% 100% 100%  Weight:      Height:        Intake/Output Summary (Last 24 hours) at 08/10/2022 1355 Last data filed at 08/10/2022 0900 Gross per 24 hour  Intake 1050.84 ml  Output 850 ml  Net 200.84 ml   Filed Weights   07/20/2022 1423 08/12/2022 2013  Weight: 69.3 kg 67.1 kg    Examination:  General exam: Appears calm and comfortable  Respiratory system: Bibasilar crackles Cardiovascular system: S1S2, RRR, no murmur Gastrointestinal system: Distended, Pos fluid wave Central nervous system: Alert and oriented. No focal neurological deficits. Extremities: Symmetric 5 x 5 power. Skin: No rashes, lesions or ulcers Psychiatry: Judgement and insight appear normal. Mood & affect appropriate.     Data Reviewed: I have personally reviewed following labs and imaging studies  CBC: Recent Labs  Lab 08/07/2022 1425 08/10/22 0407  WBC 12.5* 11.8*  HGB 9.0* 8.9*  HCT 28.3* 28.0*  MCV 96.6 97.2  PLT 189 123456*   Basic Metabolic Panel: Recent Labs  Lab 08/08/2022 1425 08/10/22 0407  NA 130* 132*  K 4.8 4.8  CL 101 106  CO2 17* 14*  GLUCOSE 93 74  BUN 55* 53*  CREATININE 3.45* 3.23*  CALCIUM 8.5* 8.3*   GFR: Estimated Creatinine Clearance: 23.7 mL/min (A) (by C-G formula based on SCr of 3.23 mg/dL (H)). Liver Function Tests: Recent Labs  Lab 08/01/2022 1425 08/10/22 0407  AST 13* 36  ALT 7 10  ALKPHOS 135* 205*  BILITOT 0.9 1.4*  PROT 5.6* 5.6*  ALBUMIN 2.1* 2.9*   Recent Labs  Lab 08/07/2022 1425  LIPASE 24   No results for input(s): "AMMONIA" in the last 168 hours. Coagulation Profile: Recent Labs  Lab 07/26/2022 1425 08/10/22 0407  INR 1.5* 1.7*   Cardiac Enzymes: No results for input(s): "CKTOTAL", "CKMB", "CKMBINDEX", "TROPONINI" in the last 168 hours. BNP (last 3 results) No results for input(s): "PROBNP" in the last 8760  hours. HbA1C: No results for input(s): "HGBA1C" in the last 72 hours. CBG: No results for input(s): "GLUCAP" in the last 168 hours. Lipid Profile: No results for input(s): "CHOL", "HDL", "LDLCALC", "TRIG", "CHOLHDL", "LDLDIRECT" in the last 72 hours. Thyroid Function Tests: No results for input(s): "TSH", "T4TOTAL", "FREET4", "T3FREE", "THYROIDAB" in the last 72 hours. Anemia Panel: No results for input(s): "VITAMINB12", "FOLATE", "FERRITIN", "TIBC", "IRON", "RETICCTPCT" in the last 72 hours. Sepsis Labs: Recent Labs  Lab 07/31/2022 1425 08/07/2022 1623  PROCALCITON 0.79  --   LATICACIDVEN  --  1.2    Recent Results (from the past 240 hour(s))  Culture, blood (Routine X 2) w Reflex to ID Panel     Status: None (Preliminary result)   Collection Time: 08/12/2022  2:25 PM   Specimen: BLOOD  Result Value Ref Range Status   Specimen Description BLOOD LEFT ANTECUBITAL  Final   Special Requests   Final    BOTTLES DRAWN AEROBIC AND ANAEROBIC Blood Culture adequate volume   Culture   Final    NO GROWTH < 24 HOURS Performed at [facility], [address] [place], [place] [address]    Report Status PENDING  Incomplete  Culture, blood (Routine X 2) w Reflex to ID  Panel     Status: None (Preliminary result)   Collection Time: 08/01/2022  2:27 PM   Specimen: BLOOD  Result Value Ref Range Status   Specimen Description BLOOD RIGHT ANTECUBITAL  Final   Special Requests   Final    BOTTLES DRAWN AEROBIC AND ANAEROBIC Blood Culture adequate volume   Culture   Final    NO GROWTH < 24 HOURS Performed at [facility], [address] [place], [place] [address]    Report Status PENDING  Incomplete  Body fluid culture w Gram Stain     Status: None (Preliminary result)   Collection Time: 07/28/2022  4:59 PM   Specimen: Peritoneal Washings; Body Fluid  Result Value Ref Range Status   Specimen Description   Final    PERITONEAL Performed at [facility], [address] [place], [place] [address]    Special Requests   Final    PERITONEAL Performed at [facility], [address] [place], [place] [address]    Gram Stain   Final    RARE WBC PRESENT, PREDOMINANTLY PMN NO ORGANISMS SEEN    Culture   Final    NO GROWTH < 12 HOURS Performed at [place] [address], [place], [place] [address]    Report Status PENDING  Incomplete  MRSA Next Gen by PCR, Nasal     Status: None   Collection Time: 08/06/2022  9:57 PM   Specimen: Nasal Mucosa; Nasal Swab  Result Value Ref Range Status   MRSA by PCR Next Gen NOT DETECTED NOT DETECTED Final    Comment: (NOTE) The GeneXpert MRSA Assay (FDA approved for NASAL specimens only), is one component of a comprehensive MRSA colonization surveillance program. It is not intended to diagnose MRSA infection nor to guide or monitor treatment for MRSA infections. Test performance is not FDA approved in patients less than 87 years old. Performed at [facility], [address], [place], [place] [address]          Radiology Studies: US ABDOMEN LIMITED RUQ (LIVER/GB)  Result Date: 08/10/2022 CLINICAL DATA:  Distension EXAM: ULTRASOUND ABDOMEN LIMITED RIGHT UPPER QUADRANT COMPARISON:  None Available. FINDINGS: Gallbladder: Diffuse gallbladder wall thickening up to 8 mm. No gallstones or biliary sludge visualized. No sonographic Murphy sign noted by sonographer. Common bile duct: Diameter: 4 mm Liver: Heterogeneous liver echotexture with nodular surface contour. No liver lesion is identified sonographically. Portal vein is patent on color Doppler imaging with normal direction of blood flow towards the liver. Other: Large volume ascites. IMPRESSION: 1. Cirrhotic morphology of the liver. No liver lesion is identified sonographically. 2. Large volume ascites. 3. Diffuse gallbladder wall thickening, favored to be secondary to liver disease. Electronically Signed   By: Davina Poke D.O.   On:  07/24/2022 15:23        Scheduled Meds:  Chlorhexidine Gluconate Cloth  6 each Topical Daily   midodrine  7.5 mg Oral TID WC   midodrine  7.5 mg Oral Once   rOPINIRole  1 mg Oral BID   Continuous Infusions:  albumin human     cefTRIAXone (ROCEPHIN)  IV 2 g (08/10/22 0901)   octreotide (SANDOSTATIN) 500 mcg in sodium chloride 0.9 % 250 mL (2 mcg/mL) infusion 50 mcg/hr (08/10/22 0714)     LOS: 1 day      Sidney Ace, MD [facility]   If 7PM-7AM, please contact night-coverage  08/10/2022, 1:55 PM

## 2022-08-10 NOTE — Plan of Care (Signed)

## 2022-08-10 NOTE — Progress Notes (Signed)
IR was requested for paracentesis.   The procedure scheduled for tomorrow AM. Please note that patient must be able to travel to [place] department for the procedure.   Please call IR for questions and concerns.    Armando Gang Mickell Birdwell PA-C 08/10/2022 2:58 PM

## 2022-08-11 ENCOUNTER — Inpatient Hospital Stay: Payer: Medicare Other

## 2022-08-11 DIAGNOSIS — K767 Hepatorenal syndrome: Secondary | ICD-10-CM | POA: Diagnosis not present

## 2022-08-11 LAB — BODY FLUID CELL COUNT WITH DIFFERENTIAL
Lymphs, Fluid: 1 %
Monocyte-Macrophage-Serous Fluid: 9 %
Neutrophil Count, Fluid: 90 %
Total Nucleated Cell Count, Fluid: 1749 cu mm

## 2022-08-11 LAB — CBC WITH DIFFERENTIAL/PLATELET
Abs Immature Granulocytes: 0.23 10*3/uL — ABNORMAL HIGH (ref 0.00–0.07)
Basophils Absolute: 0 10*3/uL (ref 0.0–0.1)
Basophils Relative: 0 %
Eosinophils Absolute: 0.1 10*3/uL (ref 0.0–0.5)
Eosinophils Relative: 0 %
HCT: 28.8 % — ABNORMAL LOW (ref 39.0–52.0)
Hemoglobin: 9.3 g/dL — ABNORMAL LOW (ref 13.0–17.0)
Immature Granulocytes: 1 %
Lymphocytes Relative: 4 %
Lymphs Abs: 0.7 10*3/uL (ref 0.7–4.0)
MCH: 31.1 pg (ref 26.0–34.0)
MCHC: 32.3 g/dL (ref 30.0–36.0)
MCV: 96.3 fL (ref 80.0–100.0)
Monocytes Absolute: 1.7 10*3/uL — ABNORMAL HIGH (ref 0.1–1.0)
Monocytes Relative: 10 %
Neutro Abs: 14.8 10*3/uL — ABNORMAL HIGH (ref 1.7–7.7)
Neutrophils Relative %: 85 %
Platelets: 120 10*3/uL — ABNORMAL LOW (ref 150–400)
RBC: 2.99 MIL/uL — ABNORMAL LOW (ref 4.22–5.81)
RDW: 16 % — ABNORMAL HIGH (ref 11.5–15.5)
WBC: 17.6 10*3/uL — ABNORMAL HIGH (ref 4.0–10.5)
nRBC: 0 % (ref 0.0–0.2)

## 2022-08-11 LAB — AMMONIA: Ammonia: 55 umol/L — ABNORMAL HIGH (ref 9–35)

## 2022-08-11 LAB — BASIC METABOLIC PANEL
Anion gap: 15 (ref 5–15)
BUN: 55 mg/dL — ABNORMAL HIGH (ref 6–20)
CO2: 10 mmol/L — ABNORMAL LOW (ref 22–32)
Calcium: 7.9 mg/dL — ABNORMAL LOW (ref 8.9–10.3)
Chloride: 107 mmol/L (ref 98–111)
Creatinine, Ser: 3.49 mg/dL — ABNORMAL HIGH (ref 0.61–1.24)
GFR, Estimated: 19 mL/min — ABNORMAL LOW (ref >60)
Glucose, Bld: 103 mg/dL — ABNORMAL HIGH (ref 70–99)
Potassium: 5.1 mmol/L (ref 3.5–5.1)
Sodium: 132 mmol/L — ABNORMAL LOW (ref 135–145)

## 2022-08-11 LAB — LACTATE DEHYDROGENASE, PLEURAL OR PERITONEAL FLUID: LD, Fluid: 142 U/L — ABNORMAL HIGH (ref 3–23)

## 2022-08-11 MED ORDER — MIDODRINE HCL 5 MG PO TABS
10.0000 mg | ORAL_TABLET | Freq: Three times a day (TID) | ORAL | Status: DC
  Administered 2022-08-11 – 2022-08-12 (×3): 10 mg via ORAL
  Filled 2022-08-11 (×3): qty 2

## 2022-08-11 MED ORDER — SODIUM BICARBONATE 650 MG PO TABS
650.0000 mg | ORAL_TABLET | Freq: Three times a day (TID) | ORAL | Status: DC
  Administered 2022-08-11 – 2022-08-12 (×5): 650 mg via ORAL
  Filled 2022-08-11 (×6): qty 1

## 2022-08-11 MED ORDER — LIDOCAINE HCL (PF) 1 % IJ SOLN
10.0000 mL | Freq: Once | INTRAMUSCULAR | Status: AC
  Administered 2022-08-11: 10 mL via INTRADERMAL
  Filled 2022-08-11: qty 10

## 2022-08-11 MED ORDER — ALBUMIN HUMAN 25 % IV SOLN
25.0000 g | INTRAVENOUS | Status: AC | PRN
  Administered 2022-08-11 – 2022-08-13 (×2): 25 g via INTRAVENOUS
  Filled 2022-08-11 (×3): qty 100

## 2022-08-11 NOTE — Progress Notes (Signed)
   08/11/22 0801  Assess: MEWS Score  Temp 97.8 F (36.6 C)  BP (!) 80/52  Pulse Rate 80  Resp 20  Level of Consciousness Alert  SpO2 100 %  O2 Device Room Air  Assess: MEWS Score  MEWS Temp 0  MEWS Systolic 2  MEWS Pulse 0  MEWS RR 0  MEWS LOC 0  MEWS Score 2  MEWS Score Color Yellow  Assess: if the MEWS score is Yellow or Red  Were vital signs taken at a resting state? Yes  Focused Assessment No change from prior assessment  Does the patient meet 2 or more of the SIRS criteria? No  MEWS guidelines implemented  Yes, yellow  Treat  MEWS Interventions Considered administering scheduled or prn medications/treatments as ordered  Take Vital Signs  Increase Vital Sign Frequency  Yellow: Q2hr x1, continue Q4hrs until patient remains green for 12hrs  Escalate  MEWS: Escalate Yellow: Discuss with charge nurse and consider notifying provider and/or RRT  Notify: Charge Nurse/RN  Name of Charge Nurse/RN Notified [place]  Provider Notification  Provider Name/Title Dr Priscella Mann  Date Provider Notified 08/11/22  Time Provider Notified [phone]  Method of Notification Page  Provider response At bedside  Date of Provider Response 08/11/22  Time of Provider Response 0824  Assess: SIRS CRITERIA  SIRS Temperature  0  SIRS Pulse 0  SIRS Respirations  0  SIRS WBC 0  SIRS Score Sum  0

## 2022-08-11 NOTE — Progress Notes (Signed)
PROGRESS NOTE    Alex Green  [id number] DOB: 1966-04-07 DOA: 08/11/2022 PCP: Olin Hauser, DO    Brief Narrative:  57 year old male with history of remote alcohol abuse, hypertension, non-insulin-dependent diabetes mellitus, generalized anxiety disorder, unintentional weight loss, who presents emergency department for chief concerns of distended abdomen.  He reports swelling and distention of is belly for the last 2-3 weeks. He denies fever at home. He endorses generalized diffused abdominal pain, that is dull. He reprots the last time he drank was about 2-3 years ago. He was a heavy drinker and then quit.   He endorses unintentional weight loss of over 100 pounds in the last year.   He reports the pain is at least an 8/10, persistent.   2/24: Patient remains hemodynamically stable.  No hemodynamically significant blood loss noted.  Creatinine improved.  Patient was retaining urine.  Foley placed.  850 cc urine liberated.  Stable for transfer to medical floor.  2/25: Status post large-volume paracentesis.  5 L fluid removed.  Remains on Rocephin.  Creatinine continues to worsen.  Nephrology engaged  Assessment & Plan:   Principal Problem:   Hepatorenal syndrome ([place]) Active Problems:   Essential hypertension   GAD (generalized anxiety disorder)   GERD (gastroesophageal reflux disease)   Tobacco abuse   Hyperlipidemia associated with type 2 diabetes mellitus (HCC)   Major depressive disorder, recurrent, moderate (HCC)   Abdominal pain   Unintentional weight loss   Hyponatremia   AKI (acute kidney injury) ([place])   Alcoholic cirrhosis of liver with ascites (HCC)   SBP (spontaneous bacterial peritonitis) ([place])  Concern for hepatorenal syndrome Oliguric acute kidney injury Patient presented with AKI and decreased urine output.  Hepatorenal syndrome on differential.  Postobstructive uropathy in the setting of ascites also on differential.  Creatinine worsening as  of 2/25.  Foley catheter placed 2/24  plan: Nephrology consulted Continue octreotide Continue midodrine Daily renal function Continue Foley catheter Monitor U OP Avoid nephrotoxins  SBP (spontaneous bacterial peritonitis) ([place]) Diagnostic paracentesis with concern for spontaneous bacterial peritonitis.  Received vancomycin, cefepime, metronidazole. Paracentesis completed 2/25.  5 L fluid removed Plan: Continue IV Rocephin for SBP treatment Albumin bolus x 1   AKI (acute kidney injury) ([place]) See above for plan Foley catheter in place   Hyponatremia Chronic likely in the setting of chronic liver disease  Unintentional weight loss With history of alcohol abuse along with liver cirrhosis on ultrasound - There is concern for carcinoma however a CT of the abdomen and pelvis with contrast needs to be avoided at this time given acute kidney injury - Will need to pursue advanced imaging modality.  On hold now pending acute kidney injury.  If kidney function recovered will pursue CT abdomen pelvis with contrast versus MRI abdomen   Abdominal pain Suspect secondary SBP in setting of abdominal ascites and patient with liver cirrhosis Diagnostic paracentesis done in ED. significant for SBP.  On IV Rocephin as above.  Status post therapeutic paracentesis.  5 L fluid removed.  Hyperlipidemia associated with type 2 diabetes mellitus ([place]) No longer on antihyperglycemic agents due to significant weight loss   Tobacco abuse PRN nicotine patch   Essential hypertension BP meds on hold   DVT prophylaxis: SCD Code Status: Full Family Communication:[facility] [phone] on 2/24 Disposition Plan: Status is: Inpatient Remains inpatient appropriate because: Decompensated hepatic cirrhosis   Level of care: Telemetry Medical  Consultants:  Palliative care  Procedures:  Paracentesis ordered  Antimicrobials: Rocephin  Subjective: Seen and examined.  Appears fatigued.   Difficult to arouse.  Lethargic  Objective: Vitals:   08/11/22 0801 08/11/22 0834 08/11/22 0910 08/11/22 0955  BP: (!) 80/52 (!) 87/57 (!) 86/52 (!) 83/52  Pulse: 80 73 74 76  Resp: 20   16  Temp: 97.8 F (36.6 C)     TempSrc:      SpO2: 100% 100% 100% 100%  Weight:      Height:        Intake/Output Summary (Last 24 hours) at 08/11/2022 1130 Last data filed at 08/11/2022 0416 [place] per 24 hour  Intake 743.57 ml  Output --  Net 743.57 ml   Filed Weights   07/28/2022 1423 08/01/2022 2013 08/11/22 0500  Weight: 69.3 kg 67.1 kg 67.7 kg    Examination:  General exam: Lethargic, weak, fatigued Respiratory system: Bibasilar crackles, decreased respiratory effort Cardiovascular system: S1S2, RRR, no murmur Gastrointestinal system: Distended, nontender Central nervous system:, Lethargic, oriented to person and place Extremities: Symmetric 5 x 5 power. Skin: No rashes, lesions or ulcers Psychiatry: Judgement and insight appear impaired. Mood & affect flattened.     Data Reviewed: I have personally reviewed following labs and imaging studies  CBC: Recent Labs  Lab 07/20/2022 1425 08/10/22 0407 08/11/22 0816  WBC 12.5* 11.8* 17.6*  NEUTROABS  --   --  14.8*  HGB 9.0* 8.9* 9.3*  HCT 28.3* 28.0* 28.8*  MCV 96.6 97.2 96.3  PLT 189 144* 123456*   Basic Metabolic Panel: Recent Labs  Lab 07/29/2022 1425 08/10/22 0407 08/11/22 0816  NA 130* 132* 132*  K 4.8 4.8 5.1  CL 101 106 107  CO2 17* 14* 10*  GLUCOSE 93 74 103*  BUN 55* 53* 55*  CREATININE 3.45* 3.23* 3.49*  CALCIUM 8.5* 8.3* 7.9*   GFR: Estimated Creatinine Clearance: 22.1 mL/min (A) (by C-G formula based on SCr of 3.49 mg/dL (H)). Liver Function Tests: Recent Labs  Lab 08/06/2022 1425 08/10/22 0407  AST 13* 36  ALT 7 10  ALKPHOS 135* 205*  BILITOT 0.9 1.4*  PROT 5.6* 5.6*  ALBUMIN 2.1* 2.9*   Recent Labs  Lab 08/08/2022 1425  LIPASE 24   No results for input(s): "AMMONIA" in the last 168  hours. Coagulation Profile: Recent Labs  Lab 07/20/2022 1425 08/10/22 0407  INR 1.5* 1.7*   Cardiac Enzymes: No results for input(s): "CKTOTAL", "CKMB", "CKMBINDEX", "TROPONINI" in the last 168 hours. BNP (last 3 results) No results for input(s): "PROBNP" in the last 8760 hours. HbA1C: No results for input(s): "HGBA1C" in the last 72 hours. CBG: No results for input(s): "GLUCAP" in the last 168 hours. Lipid Profile: No results for input(s): "CHOL", "HDL", "LDLCALC", "TRIG", "CHOLHDL", "LDLDIRECT" in the last 72 hours. Thyroid Function Tests: No results for input(s): "TSH", "T4TOTAL", "FREET4", "T3FREE", "THYROIDAB" in the last 72 hours. Anemia Panel: No results for input(s): "VITAMINB12", "FOLATE", "FERRITIN", "TIBC", "IRON", "RETICCTPCT" in the last 72 hours. Sepsis Labs: Recent Labs  Lab 07/31/2022 1425 07/27/2022 1623 08/10/22 1435  PROCALCITON 0.79  --   --   LATICACIDVEN  --  1.2 1.3    Recent Results (from the past 240 hour(s))  Culture, blood (Routine X 2) w Reflex to ID Panel     Status: None (Preliminary result)   Collection Time: 08/08/2022  2:25 PM   Specimen: BLOOD  Result Value Ref Range Status   Specimen Description BLOOD LEFT ANTECUBITAL  Final   Special Requests   Final    BOTTLES DRAWN  AEROBIC AND ANAEROBIC Blood Culture adequate volume   Culture   Final    NO GROWTH < 24 HOURS Performed at [facility], [address] [place], [place] [address]    Report Status PENDING  Incomplete  Culture, blood (Routine X 2) w Reflex to ID Panel     Status: None (Preliminary result)   Collection Time: 07/31/2022  2:27 PM   Specimen: BLOOD  Result Value Ref Range Status   Specimen Description BLOOD RIGHT ANTECUBITAL  Final   Special Requests   Final    BOTTLES DRAWN AEROBIC AND ANAEROBIC Blood Culture adequate volume   Culture   Final    NO GROWTH < 24 HOURS Performed at [facility], [address], [place], [place] [address]    Report Status  PENDING  Incomplete  Body fluid culture w Gram Stain     Status: None (Preliminary result)   Collection Time: 08/08/2022  4:59 PM   Specimen: Peritoneal Washings; Body Fluid  Result Value Ref Range Status   Specimen Description   Final    PERITONEAL Performed at [facility], [address] [place], [place] [address]    Special Requests   Final    PERITONEAL Performed at [facility], [address] [place], [place] [address]    Gram Stain   Final    RARE WBC PRESENT, PREDOMINANTLY PMN NO ORGANISMS SEEN    Culture   Final    NO GROWTH 2 DAYS Performed at [facility], [place] [address] [place], [place] [address]    Report Status PENDING  Incomplete  MRSA Next Gen by PCR, Nasal     Status: None   Collection Time: 07/18/2022  9:57 PM   Specimen: Nasal Mucosa; Nasal Swab  Result Value Ref Range Status   MRSA by PCR Next Gen NOT DETECTED NOT DETECTED Final    Comment: (NOTE) The GeneXpert MRSA Assay (FDA approved for NASAL specimens only), is one component of a comprehensive MRSA colonization surveillance program. It is not intended to diagnose MRSA infection nor to guide or monitor treatment for MRSA infections. Test performance is not FDA approved in patients less than 10 years old. Performed at [facility], [address] [place], [place] [address]          Radiology Studies: US Paracentesis  Result Date: 08/11/2022 INDICATION: 57 year old male presents with abdominal distension, previous imaging showed ascites. Request for therapeutic and diagnostic paracentesis up to 5 L. EXAM: ULTRASOUND GUIDED  PARACENTESIS MEDICATIONS: 10 mL 1% lidocaine COMPLICATIONS: None immediate. PROCEDURE: Informed written consent was obtained from the patient after a discussion of the risks, benefits and alternatives to treatment. A timeout was performed prior to the initiation of the procedure. Initial ultrasound scanning demonstrates a large amount of ascites  within the right lower abdominal quadrant. The right lower abdomen was prepped and draped in the usual sterile fashion. 1% lidocaine was used for local anesthesia. Following this, a 6 Fr Safe-T-Centesis catheter was introduced. An ultrasound image was saved for documentation purposes. The paracentesis was performed. The catheter was removed and a dressing was applied. The patient tolerated the procedure well without immediate post procedural complication. FINDINGS: A total of approximately 5 L of hazy yellow fluid was removed. Samples were sent to the laboratory as requested by the clinical team. IMPRESSION: Successful ultrasound-guided paracentesis yielding 5 liters of peritoneal fluid. PLAN: If the patient eventually requires >/=2 paracenteses in a 30 day period, candidacy for  formal evaluation by the [facility] will be assessed. Read by: Durenda Guthrie, PA-C Electronically Signed   By: Jacqulynn Cadet M.D.   On: 08/11/2022 09:54   US ABDOMEN LIMITED RUQ (LIVER/GB)  Result Date: 07/22/2022 CLINICAL DATA:  Distension EXAM: ULTRASOUND ABDOMEN LIMITED RIGHT UPPER QUADRANT COMPARISON:  None Available. FINDINGS: Gallbladder: Diffuse gallbladder wall thickening up to 8 mm. No gallstones or biliary sludge visualized. No sonographic Murphy sign noted by sonographer. Common bile duct: Diameter: 4 mm Liver: Heterogeneous liver echotexture with nodular surface contour. No liver lesion is identified sonographically. Portal vein is patent on color Doppler imaging with normal direction of blood flow towards the liver. Other: Large volume ascites. IMPRESSION: 1. Cirrhotic morphology of the liver. No liver lesion is identified sonographically. 2. Large volume ascites. 3. Diffuse gallbladder wall thickening, favored to be secondary to liver disease. Electronically Signed   By: Davina Poke D.O.   On: 08/05/2022 15:23        Scheduled Meds:  Chlorhexidine Gluconate  Cloth  6 each Topical Daily   midodrine  10 mg Oral TID WC   rOPINIRole  1 mg Oral BID   sodium bicarbonate  650 mg Oral TID   Continuous Infusions:  albumin human 25 g (08/11/22 1116)   cefTRIAXone (ROCEPHIN)  IV 2 g (08/11/22 1013)   octreotide (SANDOSTATIN) 500 mcg in sodium chloride 0.9 % 250 mL (2 mcg/mL) infusion 50 mcg/hr (08/11/22 0416)     LOS: 2 days      Sidney Ace, MD [facility]   If 7PM-7AM, please contact night-coverage  08/11/2022, 11:30 AM

## 2022-08-11 NOTE — Consult Note (Signed)
[facility]  CONSULT NOTE    Date: 08/11/2022                  Patient Name:  Alex Green  MRN: [id number]  DOB: [birth year]  Age / Sex: 57 y.o., male         PCP: Olin Hauser, DO                 Service Requesting Consult: Dr. Priscella Mann                 Reason for Consult: Acute kidney injury            History of Present Illness: Alex Green admitted for abdominal pain. Patient found to have large ascites and lower extremity edema that had been getting worse for the last 2-3 weeks. Patient is a poor historian and history taken mostly from the chart. Patient has experienced over a 100 pound weight loss in the last year. Patient quit drinking several years ago. Continues to smoke.   Patient diagnosed with hepatorenal syndrome. Nephrology consulted for worsening renal failure and metabolic acidosis. Patient also has worsening thrombocytopenia.   Patient had a diagnostic paracentesis on admission and was found to have spontaneous bacterial peritonitis. Cultures are still pending. Placed on empiric ceftriaxone.   Patient underwent large volume paracentesis this morning yielding 5 liters.    Medications: Outpatient medications: Medications Prior to Admission  Medication Sig Dispense Refill Last Dose   busPIRone (BUSPAR) 5 MG tablet Take 1 tablet (5 mg total) by mouth 3 (three) times daily as needed (anxiety panic). 90 tablet 1    clotrimazole-betamethasone (LOTRISONE) cream Apply 2 times a day for worsening flare dry skin dermatitis of toes/feet, may re-use daily up to 1 week as needed. 30 g 2    erythromycin ophthalmic ointment Place 1 Application into both eyes at bedtime as needed. 3.5 g 2    fluticasone (FLONASE) 50 MCG/ACT nasal spray PLACE 2 SPRAYS INTO BOTH NOSTRILS DAILY FOR 4-6 WEEKS THEN STOP AND USE SEASONALLY OR AS NEEDED. 48 mL 1    levETIRAcetam (KEPPRA) 500 MG tablet Take 1 tablet (500 mg total) by mouth 2 (two) times daily  for 7 days. 14 tablet 0    levETIRAcetam (KEPPRA) 500 MG tablet levetiracetam 500 mg tablet  TAKE 1 TABLET BY MOUTH TWICE A DAY FOR 7 DAYS (Patient not taking: Reported on 10/05/2021)      mirtazapine (REMERON) 15 MG tablet TAKE 1 TABLET BY MOUTH EVERYDAY AT BEDTIME 90 tablet 1    ondansetron (ZOFRAN-ODT) 4 MG disintegrating tablet TAKE 1 TABLET BY MOUTH EVERY 8 HOURS AS NEEDED FOR NAUSEA AND VOMITING 30 tablet 0    rOPINIRole (REQUIP) 1 MG tablet Take 1 tablet (1 mg total) by mouth in the morning and at bedtime. 180 tablet 1    zolpidem (AMBIEN CR) 12.5 MG CR tablet Take 1 tablet (12.5 mg total) by mouth at bedtime as needed for sleep. 90 tablet 1     Current medications: Current Facility-Administered Medications  Medication Dose Route Frequency Provider Last Rate Last Admin   acetaminophen (TYLENOL) tablet 650 mg  650 mg Oral Q6H PRN Cox, Amy N, DO       Or   acetaminophen (TYLENOL) suppository 650 mg  650 mg Rectal Q6H PRN Cox, Amy N, DO       albumin human 25 % solution 25 g  25 g Intravenous PRN Sidney Ace, MD  cefTRIAXone (ROCEPHIN) 2 g in sodium chloride 0.9 % 100 mL IVPB  2 g Intravenous Q24H Priscella Mann, Sudheer B, MD 200 mL/hr at 08/11/22 1013 2 g at 08/11/22 1013   Chlorhexidine Gluconate Cloth 2 % PADS 6 each  6 each Topical Daily Cox, Amy N, DO   6 each at 08/10/22 [id number]   hydrALAZINE (APRESOLINE) injection 5 mg  5 mg Intravenous Q8H PRN Cox, Amy N, DO       HYDROmorphone (DILAUDID) injection 0.5 mg  0.5 mg Intravenous Q3H PRN Sreenath, Sudheer B, MD       LORazepam (ATIVAN) injection 2 mg  2 mg Intravenous PRN Cox, Amy N, DO       LORazepam (ATIVAN) tablet 1 mg  1 mg Oral Q6H PRN Cox, Amy N, DO       midodrine (PROAMATINE) tablet 10 mg  10 mg Oral TID WC Sreenath, Sudheer B, MD       nicotine (NICODERM CQ - dosed in mg/24 hours) patch 21 mg  21 mg Transdermal Daily PRN Cox, Amy N, DO   21 mg at 08/05/2022 1713   octreotide (SANDOSTATIN) 500 mcg in sodium chloride 0.9 %  250 mL (2 mcg/mL) infusion  50 mcg/hr Intravenous Continuous Cox, Amy N, DO 25 mL/hr at 08/11/22 0416 50 mcg/hr at 08/11/22 0416   ondansetron (ZOFRAN) tablet 4 mg  4 mg Oral Q6H PRN Cox, Amy N, DO       Or   ondansetron (ZOFRAN) injection 4 mg  4 mg Intravenous Q6H PRN Cox, Amy N, DO       oxyCODONE (Oxy IR/ROXICODONE) immediate release tablet 5-10 mg  5-10 mg Oral Q4H PRN Ralene Muskrat B, MD   5 mg at 08/11/22 0244   rOPINIRole (REQUIP) tablet 1 mg  1 mg Oral BID Cox, Amy N, DO   1 mg at 08/11/22 0756   senna-docusate (Senokot-S) tablet 1 tablet  1 tablet Oral QHS PRN Cox, Amy N, DO       sodium bicarbonate tablet 650 mg  650 mg Oral TID Sreenath, Sudheer B, MD       zolpidem (AMBIEN) tablet 5 mg  5 mg Oral QHS PRN Cox, Amy N, DO          Allergies: Allergies  Allergen Reactions   Other     "Bioxin gives me a yeast infection"      Past Medical History: Past Medical History:  Diagnosis Date   Alcoholism ([place])    Anemia    Anxiety    GERD (gastroesophageal reflux disease)    Hypertension    Neuropathy      Past Surgical History: Past Surgical History:  Procedure Laterality Date   ANKLE SURGERY       Family History: History reviewed. No pertinent family history.   Social History: Social History   Socioeconomic History   Marital status: Divorced    Spouse name: Not on file   Number of children: Not on file   Years of education: Not on file   Highest education level: Not on file  Occupational History   Not on file  Tobacco Use   Smoking status: Every Day    Packs/day: 1.50    Years: 10.00    Total pack years: 15.00    Types: Cigarettes   Smokeless tobacco: Never  Substance and Sexual Activity   Alcohol use: Yes    Alcohol/week: 50.0 standard drinks of alcohol    Types: 50 Cans of beer per  week   Drug use: Not Currently   Sexual activity: Not on file  Other Topics Concern   Not on file  Social History Narrative   ** Merged History Encounter **        Social Determinants of Health   Financial Resource Strain: High Risk (02/07/2022)   Overall Financial Resource Strain (CARDIA)    Difficulty of Paying Living Expenses: Hard  Food Insecurity: No Food Insecurity (02/26/2022)   Hunger Vital Sign    Worried About Running Out of Food in the Last Year: Never true    Ran Out of Food in the Last Year: Never true  Transportation Needs: No Transportation Needs (02/26/2022)   PRAPARE - Hydrologist (Medical): No    Lack of Transportation (Non-Medical): No  Physical Activity: Inactive (04/22/2022)   Exercise Vital Sign    Days of Exercise per Week: 0 days    Minutes of Exercise per Session: 0 min  Stress: Stress Concern Present (04/17/2022)   [place]    Feeling of Stress : To some extent  Social Connections: Socially Isolated (02/04/2022)   Social Connection and Isolation Panel [NHANES]    Frequency of Communication with Friends and Family: Once a week    Frequency of Social Gatherings with Friends and Family: Never    Attends Religious Services: Never    Marine scientist or Organizations: No    Attends Archivist Meetings: Never    Marital Status: Living with partner  Intimate Partner Violence: Not At Risk (10/05/2021)   Humiliation, Afraid, Rape, and Kick questionnaire    Fear of Current or Ex-Partner: No    Emotionally Abused: No    Physically Abused: No    Sexually Abused: No      Vital Signs: Blood pressure (!) 83/52, pulse 76, temperature 97.8 F (36.6 C), resp. rate 16, height '5\' 8"'$  (1.727 m), weight 67.7 kg, SpO2 100 %.  Weight trends: Filed Weights   07/25/2022 1423 07/28/2022 2013 08/11/22 0500  Weight: 69.3 kg 67.1 kg 67.7 kg    Physical Exam: General: NAD, sitting in chair  Head: Normocephalic, atraumatic. Moist oral mucosal membranes  Eyes: Anicteric, PERRL  Neck: Supple, trachea midline  Lungs:  Bilateral  crackles, room air  Heart: Regular rate and rhythm  Abdomen:  +ascites, +tender to palpation diffusely  Extremities: 1+ peripheral edema.  Neurologic: Nonfocal, moving all four extremities  Skin: No lesions         Lab results: Basic Metabolic Panel: Recent Labs  Lab 07/26/2022 1425 08/10/22 0407 08/11/22 0816  NA 130* 132* 132*  K 4.8 4.8 5.1  CL 101 106 107  CO2 17* 14* 10*  GLUCOSE 93 74 103*  BUN 55* 53* 55*  CREATININE 3.45* 3.23* 3.49*  CALCIUM 8.5* 8.3* 7.9*    Liver Function Tests: Recent Labs  Lab 07/25/2022 1425 08/10/22 0407  AST 13* 36  ALT 7 10  ALKPHOS 135* 205*  BILITOT 0.9 1.4*  PROT 5.6* 5.6*  ALBUMIN 2.1* 2.9*   Recent Labs  Lab 07/30/2022 1425  LIPASE 24   No results for input(s): "AMMONIA" in the last 168 hours.  CBC: Recent Labs  Lab 08/06/2022 1425 08/10/22 0407 08/11/22 0816  WBC 12.5* 11.8* 17.6*  NEUTROABS  --   --  14.8*  HGB 9.0* 8.9* 9.3*  HCT 28.3* 28.0* 28.8*  MCV 96.6 97.2 96.3  PLT 189 144* 120*  Cardiac Enzymes: No results for input(s): "CKTOTAL", "CKMB", "CKMBINDEX", "TROPONINI" in the last 168 hours.  BNP: Invalid input(s): "POCBNP"  CBG: No results for input(s): "GLUCAP" in the last 168 hours.  Microbiology: Results for orders placed or performed during the hospital encounter of 08/13/2022  Culture, blood (Routine X 2) w Reflex to ID Panel     Status: None (Preliminary result)   Collection Time: 07/25/2022  2:25 PM   Specimen: BLOOD  Result Value Ref Range Status   Specimen Description BLOOD LEFT ANTECUBITAL  Final   Special Requests   Final    BOTTLES DRAWN AEROBIC AND ANAEROBIC Blood Culture adequate volume   Culture   Final    NO GROWTH < 24 HOURS Performed at [facility], [address], [place], [place] [address]    Report Status PENDING  Incomplete  Culture, blood (Routine X 2) w Reflex to ID Panel     Status: None (Preliminary result)   Collection Time: 07/29/2022  2:27 PM   Specimen: BLOOD   Result Value Ref Range Status   Specimen Description BLOOD RIGHT ANTECUBITAL  Final   Special Requests   Final    BOTTLES DRAWN AEROBIC AND ANAEROBIC Blood Culture adequate volume   Culture   Final    NO GROWTH < 24 HOURS Performed at [facility], [address] [place], [place] [address]    Report Status PENDING  Incomplete  Body fluid culture w Gram Stain     Status: None (Preliminary result)   Collection Time: 07/26/2022  4:59 PM   Specimen: Peritoneal Washings; Body Fluid  Result Value Ref Range Status   Specimen Description   Final    PERITONEAL Performed at [facility], [address] [place], [place] [address]    Special Requests   Final    PERITONEAL Performed at [facility], [address] [place], [place] [address]    Gram Stain   Final    RARE WBC PRESENT, PREDOMINANTLY PMN NO ORGANISMS SEEN    Culture   Final    NO GROWTH 2 DAYS Performed at [facility], [place] [address] [place], [place] [address]    Report Status PENDING  Incomplete  MRSA Next Gen by PCR, Nasal     Status: None   Collection Time: 08/08/2022  9:57 PM   Specimen: Nasal Mucosa; Nasal Swab  Result Value Ref Range Status   MRSA by PCR Next Gen NOT DETECTED NOT DETECTED Final    Comment: (NOTE) The GeneXpert MRSA Assay (FDA approved for NASAL specimens only), is one component of a comprehensive MRSA colonization surveillance program. It is not intended to diagnose MRSA infection nor to guide or monitor treatment for MRSA infections. Test performance is not FDA approved in patients less than 19 years old. Performed at [facility], [address] [place], [place] [address]     Coagulation Studies: Recent Labs    07/20/2022 1425 08/10/22 0407  LABPROT 17.5* 19.7*  INR 1.5* 1.7*    Urinalysis: Recent Labs    08/10/22 0925  COLORURINE YELLOW*  LABSPEC 1.014  PHURINE 5.0  GLUCOSEU NEGATIVE  HGBUR MODERATE*  BILIRUBINUR NEGATIVE   KETONESUR NEGATIVE  PROTEINUR NEGATIVE  NITRITE NEGATIVE  LEUKOCYTESUR NEGATIVE      Imaging: US Paracentesis  Result Date: 08/11/2022 INDICATION: 57 year old male presents with abdominal distension, previous imaging showed ascites. Request for therapeutic and diagnostic paracentesis up to 5 L. EXAM: ULTRASOUND GUIDED  PARACENTESIS MEDICATIONS: 10  mL 1% lidocaine COMPLICATIONS: None immediate. PROCEDURE: Informed written consent was obtained from the patient after a discussion of the risks, benefits and alternatives to treatment. A timeout was performed prior to the initiation of the procedure. Initial ultrasound scanning demonstrates a large amount of ascites within the right lower abdominal quadrant. The right lower abdomen was prepped and draped in the usual sterile fashion. 1% lidocaine was used for local anesthesia. Following this, a 6 Fr Safe-T-Centesis catheter was introduced. An ultrasound image was saved for documentation purposes. The paracentesis was performed. The catheter was removed and a dressing was applied. The patient tolerated the procedure well without immediate post procedural complication. FINDINGS: A total of approximately 5 L of hazy yellow fluid was removed. Samples were sent to the laboratory as requested by the clinical team. IMPRESSION: Successful ultrasound-guided paracentesis yielding 5 liters of peritoneal fluid. PLAN: If the patient eventually requires >/=2 paracenteses in a 30 day period, candidacy for formal evaluation by the [facility] will be assessed. Read by: Durenda Guthrie, PA-C Electronically Signed   By: Jacqulynn Cadet M.D.   On: 08/11/2022 09:54   US ABDOMEN LIMITED RUQ (LIVER/GB)  Result Date: 07/18/2022 CLINICAL DATA:  Distension EXAM: ULTRASOUND ABDOMEN LIMITED RIGHT UPPER QUADRANT COMPARISON:  None Available. FINDINGS: Gallbladder: Diffuse gallbladder wall thickening up to 8 mm. No gallstones or biliary  sludge visualized. No sonographic Murphy sign noted by sonographer. Common bile duct: Diameter: 4 mm Liver: Heterogeneous liver echotexture with nodular surface contour. No liver lesion is identified sonographically. Portal vein is patent on color Doppler imaging with normal direction of blood flow towards the liver. Other: Large volume ascites. IMPRESSION: 1. Cirrhotic morphology of the liver. No liver lesion is identified sonographically. 2. Large volume ascites. 3. Diffuse gallbladder wall thickening, favored to be secondary to liver disease. Electronically Signed   By: Davina Poke D.O.   On: 08/10/2022 15:23     Assessment & Plan: Alex Green is a 57 y.o. white male with EtOH abuse, who was admitted to [facility] on 07/29/2022 for Hepatorenal syndrome ([place]) [K76.7] Abdominal distention [R14.0] Other ascites [R18.8] Abdominal pain [R10.9] Cirrhosis of liver with ascites, unspecified hepatic cirrhosis type ([place]) [K74.60, R18.8] Acute renal failure, unspecified acute renal failure type ([place]) [N17.9]  Acute kidney injury: baseline creatinine of 1.35, GFR of greater than 60 on 01/16/22. Urinalysis with hematuria. Urine sodium is 14, usually hepatorenal syndrome has a urine sodium of less than 10. Volume expansion with IV fluids was not done due to patient's ascites and anasarca. Patient was given IV albumin 50g on admission. Patient has been started on midodrine and octreotide for presumed hepatorenal syndrome.  Continue midodrine and octreotide infusion No indication for further IV albumin Hold all diuretics, ACE-I/ARB, NSAIDs and any nephrotoxic agents. Avoid IV contrast.  Renally dose all medications  Low threshold for dialysis.   Hyponatremia: hypervolemic. 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 Negative for viral hepatitis on [facility]01/[facility]  Continue fluid restriction.   Acute metabolic acidosis: secondary to acute kidney injury.  Continue  PO sodium bicarbonate.   Anemia and thrombocytopenia: with hypercoagulable state. No vitamin D given on this admission. Heme occult was positive. Suspect patient has a subclinical GI bleed from portal hypertension.   Hypotension: secondary to liver disease Continue midodrine.  Hold all hypertensive agents.   Spontaneous Bacterial Peritonitis: cultures are pending. Status post diagnostic paracentesis on 2/23 and therapeutic paracentesis on 2/25.  Continue empiric ceftriaxone.  Patient may need empiric antibiotics on discharge.   End Stage Liver Disease: hepatic cirrhosis on abdominal ultrasound. No tumor lesions identified. MELD score calculated on 2/25 is 29 giving a 19% mortality in 90 days.      LOS: 2 Marco Adelson 2/25/202410:43 AM

## 2022-08-11 NOTE — Procedures (Signed)
PROCEDURE SUMMARY:  Successful image-guided paracentesis from the right lower abdomen.  Yielded 5 liters of hazy yellow fluid.  No immediate complications.  EBL = trace. Patient tolerated well.   Specimen was  sent for labs.  Please see imaging section of Epic for full dictation.  If the patient eventually requires >/=2 paracenteses in a 30 day period, screening evaluation by the [facility] will be assessed.   Armando Gang Magdalene Tardiff PA-C 08/11/2022 8:45 AM

## 2022-08-11 NOTE — Progress Notes (Signed)
Dr Priscella Mann came to see the patient. Order given to modify albumin and hang it when the patient returns from paracentesis

## 2022-08-12 DIAGNOSIS — R634 Abnormal weight loss: Secondary | ICD-10-CM | POA: Diagnosis not present

## 2022-08-12 DIAGNOSIS — K767 Hepatorenal syndrome: Secondary | ICD-10-CM | POA: Diagnosis not present

## 2022-08-12 DIAGNOSIS — R140 Abdominal distension (gaseous): Secondary | ICD-10-CM

## 2022-08-12 DIAGNOSIS — K7031 Alcoholic cirrhosis of liver with ascites: Secondary | ICD-10-CM

## 2022-08-12 DIAGNOSIS — Z66 Do not resuscitate: Secondary | ICD-10-CM

## 2022-08-12 LAB — CBC WITH DIFFERENTIAL/PLATELET
Abs Immature Granulocytes: 0.14 10*3/uL — ABNORMAL HIGH (ref 0.00–0.07)
Basophils Absolute: 0 10*3/uL (ref 0.0–0.1)
Basophils Relative: 0 %
Eosinophils Absolute: 0 10*3/uL (ref 0.0–0.5)
Eosinophils Relative: 0 %
HCT: 29.5 % — ABNORMAL LOW (ref 39.0–52.0)
Hemoglobin: 9.3 g/dL — ABNORMAL LOW (ref 13.0–17.0)
Immature Granulocytes: 1 %
Lymphocytes Relative: 4 %
Lymphs Abs: 0.8 10*3/uL (ref 0.7–4.0)
MCH: 30.5 pg (ref 26.0–34.0)
MCHC: 31.5 g/dL (ref 30.0–36.0)
MCV: 96.7 fL (ref 80.0–100.0)
Monocytes Absolute: 1.6 10*3/uL — ABNORMAL HIGH (ref 0.1–1.0)
Monocytes Relative: 8 %
Neutro Abs: 17.6 10*3/uL — ABNORMAL HIGH (ref 1.7–7.7)
Neutrophils Relative %: 87 %
Platelets: 144 10*3/uL — ABNORMAL LOW (ref 150–400)
RBC: 3.05 MIL/uL — ABNORMAL LOW (ref 4.22–5.81)
RDW: 16 % — ABNORMAL HIGH (ref 11.5–15.5)
WBC: 20.1 10*3/uL — ABNORMAL HIGH (ref 4.0–10.5)
nRBC: 0 % (ref 0.0–0.2)

## 2022-08-12 LAB — PATHOLOGIST SMEAR REVIEW

## 2022-08-12 LAB — BASIC METABOLIC PANEL
Anion gap: 14 (ref 5–15)
BUN: 67 mg/dL — ABNORMAL HIGH (ref 6–20)
CO2: 13 mmol/L — ABNORMAL LOW (ref 22–32)
Calcium: 7.8 mg/dL — ABNORMAL LOW (ref 8.9–10.3)
Chloride: 109 mmol/L (ref 98–111)
Creatinine, Ser: 4.32 mg/dL — ABNORMAL HIGH (ref 0.61–1.24)
GFR, Estimated: 15 mL/min — ABNORMAL LOW (ref >60)
Glucose, Bld: 127 mg/dL — ABNORMAL HIGH (ref 70–99)
Potassium: 5.3 mmol/L — ABNORMAL HIGH (ref 3.5–5.1)
Sodium: 136 mmol/L (ref 135–145)

## 2022-08-12 LAB — GLUCOSE, CAPILLARY: Glucose-Capillary: 89 mg/dL (ref 70–99)

## 2022-08-12 MED ORDER — THIAMINE HCL 100 MG/ML IJ SOLN
500.0000 mg | Freq: Three times a day (TID) | INTRAVENOUS | Status: DC
  Administered 2022-08-12 – 2022-08-14 (×6): 500 mg via INTRAVENOUS
  Filled 2022-08-12 (×9): qty 5

## 2022-08-12 MED ORDER — MIDODRINE HCL 5 MG PO TABS
15.0000 mg | ORAL_TABLET | Freq: Three times a day (TID) | ORAL | Status: DC
  Filled 2022-08-12 (×2): qty 3

## 2022-08-12 MED ORDER — ALBUMIN HUMAN 25 % IV SOLN
25.0000 g | Freq: Once | INTRAVENOUS | Status: AC
  Administered 2022-08-12: 25 g via INTRAVENOUS
  Filled 2022-08-12: qty 100

## 2022-08-12 MED ORDER — LACTULOSE 10 GM/15ML PO SOLN
20.0000 g | Freq: Two times a day (BID) | ORAL | Status: DC
  Filled 2022-08-12 (×2): qty 30

## 2022-08-12 MED ORDER — HEPARIN SODIUM (PORCINE) 5000 UNIT/ML IJ SOLN
5000.0000 [IU] | Freq: Two times a day (BID) | INTRAMUSCULAR | Status: DC
  Administered 2022-08-12 – 2022-08-14 (×3): 5000 [IU] via SUBCUTANEOUS
  Filled 2022-08-12 (×2): qty 1

## 2022-08-12 NOTE — Care Management Important Message (Signed)
Important Message  Patient Details  Name: Alex Green MRN: [id number] Date of Birth: 08/19/1965   Medicare Important Message Given:  Yes  Patient asleep upon time of visit, no family in room.  Copy of Medicare IM left in room on bedside tray for reference.    Dannette Barbara 08/12/2022, 12:35 PM

## 2022-08-12 NOTE — Consult Note (Signed)
Consultation Note Date: 08/12/2022   Patient Name: Alex Green  DOB: July 23, 1965  MRN: [id number]  Age / Sex: 57 y.o., male  PCP: Olin Hauser, DO Referring Physician: Sidney Ace, MD  Reason for Consultation: Establishing goals of care  HPI/Patient Profile: 57 y.o. male  admitted on 08/08/2022 with   h/o alcohol abuse, hypertension, non-insulin-dependent diabetes mellitus, generalized anxiety disorder, unintentional weight loss, who presents emergency department for chief concerns of distended abdomen.   He reports swelling and distention of is belly for the last 2-3 weeks. He denies fever at home. He endorses generalized diffused abdominal pain, that is dull.   Unintentional weight loss of over 100 lbs in the last year.   Patient and his family face treatment option decisions, advanced directive decisions and anticipatory care needs      Clinical Assessment and Goals of Care:  Patient is minimally responsive to gentle touch and verbal stimuli, he does not have medical decision making capacity at this time.  This NP Wadie Lessen reviewed medical records, received report from team, assessed the patient and then meet at the patient's bedside along with [facility]/SO, /her mother Harrold Donath and goddaughter/Stephanie to discuss diagnosis, prognosis, GOC, EOL wishes disposition and options.  Education offered on the significance  of his lab values and related poor prognosis and  natural trajectory of hepato-renal syndrome.   Creatine today rises to 4.32, WBC increased at 20.1. Patient is a poor candidate for dialysis.    Concept of Palliative Care was introduced as specialized medical care for people and their families living with serious illness.  If focuses on providing relief from the symptoms and stress of a serious illness.  The goal is to improve quality of life for  both the patient and the family.   Values and goals of care important to patient and family were attempted to be elicited.  Created space and opportunity for patient  and family to explore thoughts and feelings regarding current medical situation. Family report significant continued physical and functional decline over the past six months.  All express a sense of knowing the seriousness and likely poor prognosis.  Ultimately hope is for comfort and not prolonged suffering.     A  discussion was had today regarding advanced directives.  Concepts specific to code status, artifical feeding and hydration, continued IV antibiotics and rehospitalization was had.    The difference between a aggressive medical intervention path  and a palliative comfort care path for this patient at this time was had.     Education offered on MOST form and reviewed.  Hard Choice booklet left for review.   Natural trajectory and expectations at EOL were discussed.  Questions and concerns addressed.  Patient  encouraged to call with questions or concerns.     PMT will continue to support holistically.         No documented HPOA or ACP documents secured.    Hope Church-significant other of 17 years and an individual with an established  relationship who is acting in good faith and reliably convey the wishes of this patient.    SUMMARY OF RECOMMENDATIONS    Code Status/Advance Care Planning: DNR-documented today   Symptom Management:  Pain/dyspnea: Dilaudid 0.5 mg IV every 3 hours as needed Agitation: Ativan /p.o. sublingual-for 6 hours as needed  Palliative Prophylaxis:  Aspiration, Frequent Pain Assessment, Oral Care, and Turn Reposition  Additional Recommendations (Limitations, Scope, Preferences): Continue current medical interventions.  Family hope for improvement as they face difficult EOL decisions.   Psycho-social/Spiritual:  Desire for further Chaplaincy support:yes Additional Recommendations:  Education on Hospice  Prognosis:  Hours - Days  Discharge Planning: To Be Determined      Primary Diagnoses: Present on Admission:  Abdominal pain  Essential hypertension  GAD (generalized anxiety disorder)  GERD (gastroesophageal reflux disease)  Hyperlipidemia associated with type 2 diabetes mellitus (HCC)  Major depressive disorder, recurrent, moderate (HCC)  Tobacco abuse  Hepatorenal syndrome ([place])   I have reviewed the medical record, interviewed the patient and family, and examined the patient. The following aspects are pertinent.  Past Medical History:  Diagnosis Date   Alcoholism ([place])    Anemia    Anxiety    GERD (gastroesophageal reflux disease)    Hypertension    Neuropathy    Social History   Socioeconomic History   Marital status: Divorced    Spouse name: Not on file   Number of children: Not on file   Years of education: Not on file   Highest education level: Not on file  Occupational History   Not on file  Tobacco Use   Smoking status: Every Day    Packs/day: 1.50    Years: 10.00    Total pack years: 15.00    Types: Cigarettes   Smokeless tobacco: Never  Substance and Sexual Activity   Alcohol use: Yes    Alcohol/week: 50.0 standard drinks of alcohol    Types: 50 Cans of beer per week   Drug use: Not Currently   Sexual activity: Not on file  Other Topics Concern   Not on file  Social History Narrative   ** Merged History Encounter **       Social Determinants of Health   Financial Resource Strain: High Risk (02/07/2022)   Overall Financial Resource Strain (CARDIA)    Difficulty of Paying Living Expenses: Hard  Food Insecurity: No Food Insecurity (02/26/2022)   Hunger Vital Sign    Worried About Running Out of Food in the Last Year: Never true    Ran Out of Food in the Last Year: Never true  Transportation Needs: No Transportation Needs (02/26/2022)   PRAPARE - Hydrologist (Medical): No    Lack of  Transportation (Non-Medical): No  Physical Activity: Inactive (04/22/2022)   Exercise Vital Sign    Days of Exercise per Week: 0 days    Minutes of Exercise per Session: 0 min  Stress: Stress Concern Present (04/17/2022)   [place]    Feeling of Stress : To some extent  Social Connections: Socially Isolated (02/04/2022)   Social Connection and Isolation Panel [NHANES]    Frequency of Communication with Friends and Family: Once a week    Frequency of Social Gatherings with Friends and Family: Never    Attends Religious Services: Never    Marine scientist or Organizations: No    Attends Archivist Meetings: Never    Marital  Status: Living with partner   History reviewed. No pertinent family history. Scheduled Meds:  Chlorhexidine Gluconate Cloth  6 each Topical Daily   heparin injection (subcutaneous)  5,000 Units Subcutaneous Q12H   lactulose  20 g Oral BID   midodrine  15 mg Oral TID WC   rOPINIRole  1 mg Oral BID   sodium bicarbonate  650 mg Oral TID   Continuous Infusions:  albumin human Stopped (08/11/22 1252)   cefTRIAXone (ROCEPHIN)  IV 2 g (08/12/22 0824)   octreotide (SANDOSTATIN) 500 mcg in sodium chloride 0.9 % 250 mL (2 mcg/mL) infusion 50 mcg/hr (08/12/22 1053)   thiamine (VITAMIN B1) injection     PRN Meds:.acetaminophen **OR** acetaminophen, albumin human, hydrALAZINE, HYDROmorphone (DILAUDID) injection, LORazepam, LORazepam, nicotine, ondansetron **OR** ondansetron (ZOFRAN) IV, oxyCODONE, senna-docusate, zolpidem Medications Prior to Admission:  Prior to Admission medications   Medication Sig Start Date End Date Taking? Authorizing Provider  busPIRone (BUSPAR) 5 MG tablet Take 1 tablet (5 mg total) by mouth 3 (three) times daily as needed (anxiety panic). 06/28/22   [name], [name], DO  clotrimazole-betamethasone (LOTRISONE) cream Apply 2 times a day for worsening flare dry skin  dermatitis of toes/feet, may re-use daily up to 1 week as needed. 01/02/22   [name], [name], DO  erythromycin ophthalmic ointment Place 1 Application into both eyes at bedtime as needed. 06/28/22   [name], Alexander J, DO  fluticasone (FLONASE) 50 MCG/ACT nasal spray PLACE 2 SPRAYS INTO BOTH NOSTRILS DAILY FOR 4-6 WEEKS THEN STOP AND USE SEASONALLY OR AS NEEDED. 01/11/21   Parks Ranger, [name], DO  levETIRAcetam (KEPPRA) 500 MG tablet Take 1 tablet (500 mg total) by mouth 2 (two) times daily for 7 days. 09/06/20 09/13/20  Arta Silence, MD  levETIRAcetam (KEPPRA) 500 MG tablet levetiracetam 500 mg tablet  TAKE 1 TABLET BY MOUTH TWICE A DAY FOR 7 DAYS Patient not taking: Reported on 10/05/2021    [provider]  mirtazapine (REMERON) 15 MG tablet TAKE 1 TABLET BY MOUTH EVERYDAY AT BEDTIME 04/29/22   [name], Alexander J, DO  ondansetron (ZOFRAN-ODT) 4 MG disintegrating tablet TAKE 1 TABLET BY MOUTH EVERY 8 HOURS AS NEEDED FOR NAUSEA AND VOMITING 06/03/22   [name], [name], DO  rOPINIRole (REQUIP) 1 MG tablet Take 1 tablet (1 mg total) by mouth in the morning and at bedtime. 06/13/22   [name], [name], DO  zolpidem (AMBIEN CR) 12.5 MG CR tablet Take 1 tablet (12.5 mg total) by mouth at bedtime as needed for sleep. 03/27/22   Olin Hauser, DO   Allergies  Allergen Reactions   Other     "Bioxin gives me a yeast infection"   Review of Systems  Unable to perform ROS: Acuity of condition    Physical Exam Constitutional:      Appearance: He is cachectic. He is ill-appearing.  Cardiovascular:     Rate and Rhythm: Normal rate.  Pulmonary:     Effort: Pulmonary effort is normal.  Abdominal:     General: There is distension.     Palpations: Abdomen is rigid.  Musculoskeletal:     Comments: Generalized weakness and muscle atrophy   Neurological:     Mental Status: He is lethargic.     Vital Signs: BP (!) 86/54 (BP Location: Right  Arm)   Pulse 77   Temp 97.9 F (36.6 C)   Resp 16   Ht '5\' 8"'$  (1.727 m)   Wt 66.1 kg   SpO2 100%  BMI 22.16 kg/m  Pain Scale: 0-10   Pain Score: 0-No pain   SpO2: SpO2: 100 % O2 Device:SpO2: 100 % O2 Flow Rate: .   IO: Intake/output summary:  Intake/Output Summary (Last 24 hours) at 08/12/2022 1235 Last data filed at 08/12/2022 1053 Gross per 24 hour  Intake 1030.19 ml  Output 475 ml  Net 555.19 ml    LBM: Last BM Date : 08/11/22 Baseline Weight: Weight: 69.3 kg Most recent weight: Weight: 66.1 kg     Palliative Assessment/Data:  30 % at best    Time: 90 minutes  Detailed review of medical records ( labs, imaging, vital signs), medically appropriate exam ( MS, skin, resp)   discussed with treatment team/ Dr Priscella Mann and bedside RN , counseling and education to patient, family, staff, documenting clinical information, medication management, coordination of care   Discussed with Dr Priscella Mann and nursing staff    Signed by: Wadie Lessen, NP   Please contact Palliative Medicine Team phone at [phone] for questions and concerns.  For individual provider: See Shea Evans

## 2022-08-12 NOTE — Progress Notes (Signed)
   08/12/22 1300  Spiritual Encounters  Type of Visit Initial  Care provided to: Pt not available (At the time of visit, the Pt was sound asleep)  Referral source Family  Reason for visit Routine spiritual support  OnCall Visit Yes  Interventions  Spiritual Care Interventions Made Prayer;Compassionate presence   Alex Green made an attempt to see the Pt. Pt was asleep at the initial visit. Alex Green will round back

## 2022-08-12 NOTE — TOC CM/SW Note (Signed)
  Transition of Care [facility]) Screening Note   Patient Details  Name: Alex Green Date of Birth: 12/09/1965   Transition of Care [facility]) CM/SW Contact:    Candie Chroman, LCSW Phone Number: 08/12/2022, 9:28 AM    Transition of Care Department [facility]) has reviewed patient and no TOC needs have been identified at this time. We will continue to monitor patient advancement through interdisciplinary progression rounds. If new patient transition needs arise, please place a TOC consult.

## 2022-08-12 NOTE — Progress Notes (Signed)
PROGRESS NOTE    Alex Green  [id number] DOB: 12/19/1965 DOA: 07/27/2022 PCP: Olin Hauser, DO    Brief Narrative:  57 year old male with history of remote alcohol abuse, hypertension, non-insulin-dependent diabetes mellitus, generalized anxiety disorder, unintentional weight loss, who presents emergency department for chief concerns of distended abdomen.  He reports swelling and distention of is belly for the last 2-3 weeks. He denies fever at home. He endorses generalized diffused abdominal pain, that is dull. He reprots the last time he drank was about 2-3 years ago. He was a heavy drinker and then quit.   He endorses unintentional weight loss of over 100 pounds in the last year.   He reports the pain is at least an 8/10, persistent.   2/24: Patient remains hemodynamically stable.  No hemodynamically significant blood loss noted.  Creatinine improved.  Patient was retaining urine.  Foley placed.  850 cc urine liberated.  Stable for transfer to medical floor.  2/25: Status post large-volume paracentesis.  5 L fluid removed.  Remains on Rocephin.  Creatinine continues to worsen.  Nephrology engaged  2/26: Creatinine continues to worsen.  Suspicion for hepatorenal syndrome is growing.  Patient very lethargic  Assessment & Plan:   Principal Problem:   Hepatorenal syndrome ([place]) Active Problems:   Essential hypertension   GAD (generalized anxiety disorder)   GERD (gastroesophageal reflux disease)   Tobacco abuse   Hyperlipidemia associated with type 2 diabetes mellitus (HCC)   Major depressive disorder, recurrent, moderate (HCC)   Abdominal pain   Unintentional weight loss   Hyponatremia   AKI (acute kidney injury) ([place])   Alcoholic cirrhosis of liver with ascites (HCC)   SBP (spontaneous bacterial peritonitis) ([place])  Concern for hepatorenal syndrome Oliguric acute kidney injury Patient presented with AKI and decreased urine output.  Hepatorenal syndrome on  differential.  Postobstructive uropathy in the setting of ascites also on differential.  Creatinine worsening as of 2/25.  Foley catheter placed 2/24  plan: Nephrology consulted Continue octreotide Continue midodrine, dose increased to 15 3 times daily Daily renal function Continue Foley catheter Monitor U OP Avoid nephrotoxins Guarded prognosis  SBP (spontaneous bacterial peritonitis) ([place]) Diagnostic paracentesis with concern for spontaneous bacterial peritonitis.  Received vancomycin, cefepime, metronidazole. Paracentesis completed 2/25.  5 L fluid removed Plan: Continue IV Rocephin for SBP treatment No further albumin   AKI (acute kidney injury) ([place]) See above for plan Foley catheter in place   Hyponatremia Chronic likely in the setting of chronic liver disease  Unintentional weight loss With history of alcohol abuse along with liver cirrhosis on ultrasound - There is concern for carcinoma however a CT of the abdomen and pelvis with contrast needs to be avoided at this time given acute kidney injury - Will need to pursue advanced imaging modality.  On hold now pending acute kidney injury.  If kidney function recovered will pursue CT abdomen pelvis with contrast versus MRI abdomen -For now we will check alpha-fetoprotein   Abdominal pain Suspect secondary SBP in setting of abdominal ascites and patient with liver cirrhosis Diagnostic paracentesis done in ED. significant for SBP.  On IV Rocephin as above.  Status post therapeutic paracentesis.  5 L fluid removed.  Hyperlipidemia associated with type 2 diabetes mellitus (HCC) No longer on antihyperglycemic agents due to significant weight loss   Tobacco abuse PRN nicotine patch   Essential hypertension BP meds on hold  Goals of care Patient with evidence of advanced alcoholic cirrhosis and decompensated liver disease.  Also given supposed extreme weight loss I have very high concern for underlying malignancy.  Checking  alpha-fetoprotein but this is unlikely to guide management.  At this time recommend DNR status.  Palliative care aware.  Family meeting scheduled for 2/26.   DVT prophylaxis: SCD Code Status: Full Family Communication:[facility] [place] [phone] on 2/24 Disposition Plan: Status is: Inpatient Remains inpatient appropriate because: Decompensated hepatic cirrhosis   Level of care: Telemetry Medical  Consultants:  Palliative care  Procedures:  Paracentesis ordered  Antimicrobials: Rocephin    Subjective: Seen and examined.  Lethargic.  Difficult to arouse  Objective: Vitals:   08/12/22 0159 08/12/22 0318 08/12/22 0418 08/12/22 0834  BP: (!) 85/56 (!) 88/53  (!) 86/54  Pulse: 84 85  77  Resp: '16 20  16  '$ Temp: 98.5 F (36.9 C) 98.2 F (36.8 C)  97.9 F (36.6 C)  TempSrc:      SpO2: 100% 100%  100%  Weight:   66.1 kg   Height:        Intake/Output Summary (Last 24 hours) at 08/12/2022 1345 Last data filed at 08/12/2022 1053 Gross per 24 hour  Intake 1030.19 ml  Output 475 ml  Net 555.19 ml   Filed Weights   07/29/2022 2013 08/11/22 0500 08/12/22 0418  Weight: 67.1 kg 67.7 kg 66.1 kg    Examination:  General exam: Lethargic, obtunded Respiratory system: Bibasilar crackles, decreased respiratory effort Cardiovascular system: S1S2, RRR, no murmur Gastrointestinal system: Distended, nontender, decreased bowel sounds Central nervous system:, Lethargic, oriented to person and place Extremities: Symmetric 5 x 5 power. Skin: No rashes, lesions or ulcers Psychiatry: Judgement and insight appear impaired. Mood & affect flattened.     Data Reviewed: I have personally reviewed following labs and imaging studies  CBC: Recent Labs  Lab 07/19/2022 1425 08/10/22 0407 08/11/22 0816 08/12/22 0807  WBC 12.5* 11.8* 17.6* 20.1*  NEUTROABS  --   --  14.8* 17.6*  HGB 9.0* 8.9* 9.3* 9.3*  HCT 28.3* 28.0* 28.8* 29.5*  MCV 96.6 97.2 96.3 96.7  PLT 189 144* 120* 144*   Basic  Metabolic Panel: Recent Labs  Lab 08/05/2022 1425 08/10/22 0407 08/11/22 0816 08/12/22 0807  NA 130* 132* 132* 136  K 4.8 4.8 5.1 5.3*  CL 101 106 107 109  CO2 17* 14* 10* 13*  GLUCOSE 93 74 103* 127*  BUN 55* 53* 55* 67*  CREATININE 3.45* 3.23* 3.49* 4.32*  CALCIUM 8.5* 8.3* 7.9* 7.8*   GFR: Estimated Creatinine Clearance: 17.4 mL/min (A) (by C-G formula based on SCr of 4.32 mg/dL (H)). Liver Function Tests: Recent Labs  Lab 08/02/2022 1425 08/10/22 0407  AST 13* 36  ALT 7 10  ALKPHOS 135* 205*  BILITOT 0.9 1.4*  PROT 5.6* 5.6*  ALBUMIN 2.1* 2.9*   Recent Labs  Lab 08/01/2022 1425  LIPASE 24   Recent Labs  Lab 08/11/22 1753  AMMONIA 55*   Coagulation Profile: Recent Labs  Lab 08/08/2022 1425 08/10/22 0407  INR 1.5* 1.7*   Cardiac Enzymes: No results for input(s): "CKTOTAL", "CKMB", "CKMBINDEX", "TROPONINI" in the last 168 hours. BNP (last 3 results) No results for input(s): "PROBNP" in the last 8760 hours. HbA1C: No results for input(s): "HGBA1C" in the last 72 hours. CBG: Recent Labs  Lab 07/28/2022 2008  GLUCAP 89   Lipid Profile: No results for input(s): "CHOL", "HDL", "LDLCALC", "TRIG", "CHOLHDL", "LDLDIRECT" in the last 72 hours. Thyroid Function Tests: No results for input(s): "TSH", "T4TOTAL", "FREET4", "T3FREE", "THYROIDAB" in  the last 72 hours. Anemia Panel: No results for input(s): "VITAMINB12", "FOLATE", "FERRITIN", "TIBC", "IRON", "RETICCTPCT" in the last 72 hours. Sepsis Labs: Recent Labs  Lab 08/05/2022 1425 07/30/2022 1623 08/10/22 1435  PROCALCITON 0.79  --   --   LATICACIDVEN  --  1.2 1.3    Recent Results (from the past 240 hour(s))  Culture, blood (Routine X 2) w Reflex to ID Panel     Status: None (Preliminary result)   Collection Time: 07/24/2022  2:25 PM   Specimen: BLOOD  Result Value Ref Range Status   Specimen Description BLOOD LEFT ANTECUBITAL  Final   Special Requests   Final    BOTTLES DRAWN AEROBIC AND ANAEROBIC Blood  Culture adequate volume   Culture   Final    NO GROWTH 3 DAYS Performed at [facility], [address] [place], [place] [address]    Report Status PENDING  Incomplete  Culture, blood (Routine X 2) w Reflex to ID Panel     Status: None (Preliminary result)   Collection Time: 08/11/2022  2:27 PM   Specimen: BLOOD  Result Value Ref Range Status   Specimen Description BLOOD RIGHT ANTECUBITAL  Final   Special Requests   Final    BOTTLES DRAWN AEROBIC AND ANAEROBIC Blood Culture adequate volume   Culture   Final    NO GROWTH 3 DAYS Performed at [facility], [address], [place], [place] [address]    Report Status PENDING  Incomplete  Body fluid culture w Gram Stain     Status: None (Preliminary result)   Collection Time: 08/08/2022  4:59 PM   Specimen: Peritoneal Washings; Body Fluid  Result Value Ref Range Status   Specimen Description   Final    PERITONEAL Performed at [facility], [address] [place], [place] [address]    Special Requests   Final    PERITONEAL Performed at [facility], [address] [place], [place] [address]    Gram Stain   Final    RARE WBC PRESENT, PREDOMINANTLY PMN NO ORGANISMS SEEN    Culture   Final    NO GROWTH 3 DAYS Performed at [facility], [place] [address], [place], [place] [address]    Report Status PENDING  Incomplete  MRSA Next Gen by PCR, Nasal     Status: None   Collection Time: 08/08/2022  9:57 PM   Specimen: Nasal Mucosa; Nasal Swab  Result Value Ref Range Status   MRSA by PCR Next Gen NOT DETECTED NOT DETECTED Final    Comment: (NOTE) The GeneXpert MRSA Assay (FDA approved for NASAL specimens only), is one component of a comprehensive MRSA colonization surveillance program. It is not intended to diagnose MRSA infection nor to guide or monitor treatment for MRSA infections. Test performance is not FDA approved in patients less than 51 years old. Performed at [facility],  [address] [place], [place] [address]   Body fluid culture w Gram Stain     Status: None (Preliminary result)   Collection Time: 08/11/22  8:45 AM   Specimen: PATH Cytology Peritoneal fluid  Result Value Ref Range Status   Specimen Description   Final    PERITONEAL Performed at [facility], [address], [place], [place] [address]    Special Requests   Final    NONE Performed at [facility], [address] [place], [place] [address]    Gram Stain   Final  WBC PRESENT, PREDOMINANTLY PMN NO ORGANISMS SEEN CYTOSPIN SMEAR    Culture   Final    NO GROWTH < 24 HOURS Performed at [facility] [address], [place], [place] [address]    Report Status PENDING  Incomplete         Radiology Studies: US Paracentesis  Result Date: 08/11/2022 INDICATION: 57 year old male presents with abdominal distension, previous imaging showed ascites. Request for therapeutic and diagnostic paracentesis up to 5 L. EXAM: ULTRASOUND GUIDED  PARACENTESIS MEDICATIONS: 10 mL 1% lidocaine COMPLICATIONS: None immediate. PROCEDURE: Informed written consent was obtained from the patient after a discussion of the risks, benefits and alternatives to treatment. A timeout was performed prior to the initiation of the procedure. Initial ultrasound scanning demonstrates a large amount of ascites within the right lower abdominal quadrant. The right lower abdomen was prepped and draped in the usual sterile fashion. 1% lidocaine was used for local anesthesia. Following this, a 6 Fr Safe-T-Centesis catheter was introduced. An ultrasound image was saved for documentation purposes. The paracentesis was performed. The catheter was removed and a dressing was applied. The patient tolerated the procedure well without immediate post procedural complication. FINDINGS: A total of approximately 5 L of hazy yellow fluid was removed. Samples were sent to the laboratory as requested by the clinical  team. IMPRESSION: Successful ultrasound-guided paracentesis yielding 5 liters of peritoneal fluid. PLAN: If the patient eventually requires >/=2 paracenteses in a 30 day period, candidacy for formal evaluation by the [place] [facility] will be assessed. Read by: Durenda Guthrie, PA-C Electronically Signed   By: Jacqulynn Cadet M.D.   On: 08/11/2022 09:54        Scheduled Meds:  Chlorhexidine Gluconate Cloth  6 each Topical Daily   heparin injection (subcutaneous)  5,000 Units Subcutaneous Q12H   lactulose  20 g Oral BID   midodrine  15 mg Oral TID WC   rOPINIRole  1 mg Oral BID   sodium bicarbonate  650 mg Oral TID   Continuous Infusions:  albumin human Stopped (08/11/22 1252)   cefTRIAXone (ROCEPHIN)  IV 2 g (08/12/22 0824)   octreotide (SANDOSTATIN) 500 mcg in sodium chloride 0.9 % 250 mL (2 mcg/mL) infusion 50 mcg/hr (08/12/22 1053)   thiamine (VITAMIN B1) injection       LOS: 3 days      Sidney Ace, MD [facility]   If 7PM-7AM, please contact night-coverage  08/12/2022, 1:45 PM

## 2022-08-12 NOTE — Progress Notes (Addendum)
[facility]  ROUNDING NOTE   Subjective:   Patient seen laying in bed Somnolent today Eyes closed, guarding to painful stimuli  Creatinine 4.32 (3.49) Urine output 454m  Objective:  Vital signs in last 24 hours:  Temp:  [97.5 F (36.4 C)-98.6 F (37 C)] 97.9 F (36.6 C) (02/26 0834) Pulse Rate:  [73-85] 77 (02/26 0834) Resp:  [14-20] 16 (02/26 0834) BP: (85-91)/(48-60) 86/54 (02/26 0834) SpO2:  [100 %] 100 % (02/26 0834) Weight:  [66.1 kg] 66.1 kg (02/26 0418)  Weight change: -1.6 kg Filed Weights   08/11/2022 2013 08/11/22 0500 08/12/22 0418  Weight: 67.1 kg 67.7 kg 66.1 kg    Intake/Output: I/O last 3 completed shifts: In: 1445.8 [P.O.:100; I.V.:1053.4; IV Piggyback:292.4] Out: 475 [Urine:475]   Intake/Output this shift:  Total I/O In: 100 [P.O.:100] Out: -   Physical Exam: General: NAD, ill appearing  Head: Normocephalic, atraumatic. Moist oral mucosal membranes  Eyes: Anicteric  Lungs:  Clear to auscultation, normal effort  Heart: Regular rate and rhythm  Abdomen:  Soft, nontender, distended  Extremities:  No peripheral edema.  Neurologic: Somnolent  Skin: No lesions  Access: None    Basic Metabolic Panel: Recent Labs  Lab 07/31/2022 1425 08/10/22 0407 08/11/22 0816 08/12/22 0807  NA 130* 132* 132* 136  K 4.8 4.8 5.1 5.3*  CL 101 106 107 109  CO2 17* 14* 10* 13*  GLUCOSE 93 74 103* 127*  BUN 55* 53* 55* 67*  CREATININE 3.45* 3.23* 3.49* 4.32*  CALCIUM 8.5* 8.3* 7.9* 7.8*    Liver Function Tests: Recent Labs  Lab 07/26/2022 1425 08/10/22 0407  AST 13* 36  ALT 7 10  ALKPHOS 135* 205*  BILITOT 0.9 1.4*  PROT 5.6* 5.6*  ALBUMIN 2.1* 2.9*   Recent Labs  Lab 08/08/2022 1425  LIPASE 24   Recent Labs  Lab 08/11/22 1753  AMMONIA 55*    CBC: Recent Labs  Lab 07/20/2022 1425 08/10/22 0407 08/11/22 0816 08/12/22 0807  WBC 12.5* 11.8* 17.6* 20.1*  NEUTROABS  --   --  14.8* 17.6*  HGB 9.0* 8.9* 9.3* 9.3*  HCT 28.3*  28.0* 28.8* 29.5*  MCV 96.6 97.2 96.3 96.7  PLT 189 144* 120* 144*    Cardiac Enzymes: No results for input(s): "CKTOTAL", "CKMB", "CKMBINDEX", "TROPONINI" in the last 168 hours.  BNP: Invalid input(s): "POCBNP"  CBG: Recent Labs  Lab 08/03/2022 2008  GLUCAP 89    Microbiology: Results for orders placed or performed during the hospital encounter of 07/19/2022  Culture, blood (Routine X 2) w Reflex to ID Panel     Status: None (Preliminary result)   Collection Time: 07/21/2022  2:25 PM   Specimen: BLOOD  Result Value Ref Range Status   Specimen Description BLOOD LEFT ANTECUBITAL  Final   Special Requests   Final    BOTTLES DRAWN AEROBIC AND ANAEROBIC Blood Culture adequate volume   Culture   Final    NO GROWTH 3 DAYS [facility]formed at [facility] [address], [place] [address]   Report Status PENDING  Incomplete  Culture, blood (Routine X 2) w Reflex to ID Panel     Status: None (Preliminary result)   Collection Time: 07/27/2022  2:27 PM   Specimen: BLOOD  Result Value Ref Range Status   Specimen Description BLOOD RIGHT ANTECUBITAL  Final   Special Requests   Final    BOTTLES DRAWN AEROBIC AND ANAEROBIC Blood Culture adequate volume   Culture  Final    NO GROWTH 3 DAYS Performed at [facility], [address] [place], [place] [address]    Report Status PENDING  Incomplete  Body fluid culture w Gram Stain     Status: None (Preliminary result)   Collection Time: 07/20/2022  4:59 PM   Specimen: Peritoneal Washings; Body Fluid  Result Value Ref Range Status   Specimen Description   Final    PERITONEAL Performed at [facility], [address] [place], [place] [address]    Special Requests   Final    PERITONEAL Performed at [facility], [address] [place], [place] [address]    Gram Stain   Final    RARE WBC PRESENT, PREDOMINANTLY PMN NO ORGANISMS SEEN    Culture   Final    NO GROWTH 2 DAYS Performed at [facility], [place] [address], [place], [place] [address]    Report Status PENDING  Incomplete  MRSA Next Gen by PCR, Nasal     Status: None   Collection Time: 07/26/2022  9:57 PM   Specimen: Nasal Mucosa; Nasal Swab  Result Value Ref Range Status   MRSA by PCR Next Gen NOT DETECTED NOT DETECTED Final    Comment: (NOTE) The GeneXpert MRSA Assay (FDA approved for NASAL specimens only), is one component of a comprehensive MRSA colonization surveillance program. It is not intended to diagnose MRSA infection nor to guide or monitor treatment for MRSA infections. Test performance is not FDA approved in patients less than 41 years old. Performed at [facility], [address] [place], [place] [address]   Body fluid culture w Gram Stain     Status: None (Preliminary result)   Collection Time: 08/11/22  8:45 AM   Specimen: PATH Cytology Peritoneal fluid  Result Value Ref Range Status   Specimen Description   Final    PERITONEAL Performed at [facility], [address], [place], [place] [address]    Special Requests   Final    NONE Performed at [facility], [address] [place], [place] [address]    Gram Stain   Final    WBC PRESENT, PREDOMINANTLY PMN NO ORGANISMS SEEN CYTOSPIN SMEAR Performed at [facility], [place] [address], [place], [place] [address]    Culture PENDING  Incomplete   Report Status PENDING  Incomplete    Coagulation Studies: Recent Labs    07/18/2022 1425 08/10/22 0407  LABPROT 17.5* 19.7*  INR 1.5* 1.7*    Urinalysis: Recent Labs    08/10/22 0925  COLORURINE YELLOW*  LABSPEC 1.014  PHURINE 5.0  GLUCOSEU NEGATIVE  HGBUR MODERATE*  BILIRUBINUR NEGATIVE  KETONESUR NEGATIVE  PROTEINUR NEGATIVE  NITRITE NEGATIVE  LEUKOCYTESUR NEGATIVE      Imaging: US Paracentesis  Result Date: 08/11/2022 INDICATION: 57 year old male presents with abdominal distension, previous imaging showed ascites. Request for therapeutic  and diagnostic paracentesis up to 5 L. EXAM: ULTRASOUND GUIDED  PARACENTESIS MEDICATIONS: 10 mL 1% lidocaine COMPLICATIONS: None immediate. PROCEDURE: Informed written consent was obtained from the patient after a discussion of the risks, benefits and alternatives to treatment. A timeout was performed prior to the initiation of the procedure. Initial ultrasound scanning demonstrates a large amount of ascites within the right lower abdominal quadrant. The right lower abdomen was prepped and draped in the usual sterile fashion. 1% lidocaine was used for local anesthesia. Following this, a 6 Fr Safe-T-Centesis catheter was introduced. An ultrasound image was saved  for documentation purposes. The paracentesis was performed. The catheter was removed and a dressing was applied. The patient tolerated the procedure well without immediate post procedural complication. FINDINGS: A total of approximately 5 L of hazy yellow fluid was removed. Samples were sent to the laboratory as requested by the clinical team. IMPRESSION: Successful ultrasound-guided paracentesis yielding 5 liters of peritoneal fluid. PLAN: If the patient eventually requires >/=2 paracenteses in a 30 day period, candidacy for formal evaluation by the [place] [facility] will be assessed. Read by: Durenda Guthrie, PA-C Electronically Signed   By: Jacqulynn Cadet M.D.   On: 08/11/2022 09:54     Medications:    albumin human Stopped (08/11/22 1252)   cefTRIAXone (ROCEPHIN)  IV 2 g (08/12/22 0824)   octreotide (SANDOSTATIN) 500 mcg in sodium chloride 0.9 % 250 mL (2 mcg/mL) infusion 50 mcg/hr (08/12/22 0144)    Chlorhexidine Gluconate Cloth  6 each Topical Daily   midodrine  10 mg Oral TID WC   rOPINIRole  1 mg Oral BID   sodium bicarbonate  650 mg Oral TID   acetaminophen **OR** acetaminophen, albumin human, hydrALAZINE, HYDROmorphone (DILAUDID) injection, LORazepam, LORazepam, nicotine, ondansetron **OR**  ondansetron (ZOFRAN) IV, oxyCODONE, senna-docusate, zolpidem  Assessment/ Plan:  Mr. Alex Green is a 57 y.o.  male found to have large ascites and lower extremity edema that had been getting worse for the last 2-3 weeks. Patient is a poor historian and history taken mostly from the chart. Patient has experienced over a 100 pound weight loss in the last year. Patient quit drinking several years ago. Continues to smoke.   Acute kidney injury: baseline creatinine of 1.35, GFR of greater than 60 on 01/16/22. Urinalysis with hematuria. Urine sodium is 14, usually hepatorenal syndrome has a urine sodium of less than 10. Volume expansion with IV fluids was not done due to patient's ascites and anasarca. Patient was given IV albumin 50g on admission. Patient has been started on midodrine and octreotide for presumed hepatorenal syndrome.  Renal function continues to decline, oliguric Continue midodrine and octreotide infusion Hold all diuretics, ACE-I/ARB, NSAIDs and any nephrotoxic agents. Avoid IV contrast.  Renally dose all medications  Low threshold for dialysis.  Recommend palliative care consult to establish [place]   Lab Results  Component Value Date   CREATININE 4.32 (H) 08/12/2022   CREATININE 3.49 (H) 08/11/2022   CREATININE 3.23 (H) 08/10/2022    Intake/Output Summary (Last 24 hours) at 08/12/2022 0957 Last data filed at 08/12/2022 0800 Gross per 24 hour  Intake 802.26 ml  Output 475 ml  Net 327.26 ml   Hyponatremia: hypervolemic. 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 Negative for viral hepatitis on 08/01/22 at [facility].  Remains on 151m fluid restriction   Acute metabolic acidosis: secondary to acute kidney injury.  Serum bicarb 13 today Continue PO sodium bicarbonate.    Anemia and thrombocytopenia: with hypercoagulable state. No vitamin D given on this admission. Heme occult was positive. Suspect patient has a subclinical GI bleed from  portal hypertension.   A. Hemoglobin stable at 9.3   Hypotension: secondary to liver disease Blood pressure remains soft. Continue midodrine.  Hold all hypertensive agents.    Spontaneous Bacterial Peritonitis: cultures are pending. Status post diagnostic paracentesis on 2/23 and therapeutic paracentesis on 2/25.  Continue ceftriaxone  Patient may need empiric antibiotics on discharge.    End Stage Liver Disease: hepatic cirrhosis on abdominal ultrasound. No tumor lesions identified.  MELD score calculated on 2/25 is 29 giving a 19% mortality in 90 days.  A. Paracentesis completed on 08/11/22, 5L removed.     LOS: 3   2/26/20249:57 AM

## 2022-08-13 DIAGNOSIS — R188 Other ascites: Secondary | ICD-10-CM

## 2022-08-13 DIAGNOSIS — K7460 Unspecified cirrhosis of liver: Secondary | ICD-10-CM

## 2022-08-13 DIAGNOSIS — R140 Abdominal distension (gaseous): Secondary | ICD-10-CM | POA: Diagnosis not present

## 2022-08-13 DIAGNOSIS — R1084 Generalized abdominal pain: Secondary | ICD-10-CM

## 2022-08-13 DIAGNOSIS — K767 Hepatorenal syndrome: Secondary | ICD-10-CM | POA: Diagnosis not present

## 2022-08-13 LAB — CBC WITH DIFFERENTIAL/PLATELET
Abs Immature Granulocytes: 0.15 10*3/uL — ABNORMAL HIGH (ref 0.00–0.07)
Basophils Absolute: 0 10*3/uL (ref 0.0–0.1)
Basophils Relative: 0 %
Eosinophils Absolute: 0.1 10*3/uL (ref 0.0–0.5)
Eosinophils Relative: 0 %
HCT: 27.1 % — ABNORMAL LOW (ref 39.0–52.0)
Hemoglobin: 8.5 g/dL — ABNORMAL LOW (ref 13.0–17.0)
Immature Granulocytes: 1 %
Lymphocytes Relative: 6 %
Lymphs Abs: 0.8 10*3/uL (ref 0.7–4.0)
MCH: 30.6 pg (ref 26.0–34.0)
MCHC: 31.4 g/dL (ref 30.0–36.0)
MCV: 97.5 fL (ref 80.0–100.0)
Monocytes Absolute: 1.4 10*3/uL — ABNORMAL HIGH (ref 0.1–1.0)
Monocytes Relative: 10 %
Neutro Abs: 12.3 10*3/uL — ABNORMAL HIGH (ref 1.7–7.7)
Neutrophils Relative %: 83 %
Platelets: 109 10*3/uL — ABNORMAL LOW (ref 150–400)
RBC: 2.78 MIL/uL — ABNORMAL LOW (ref 4.22–5.81)
RDW: 16 % — ABNORMAL HIGH (ref 11.5–15.5)
WBC: 14.8 10*3/uL — ABNORMAL HIGH (ref 4.0–10.5)
nRBC: 0 % (ref 0.0–0.2)

## 2022-08-13 LAB — BODY FLUID CULTURE W GRAM STAIN: Culture: NO GROWTH

## 2022-08-13 LAB — CYTOLOGY - NON PAP

## 2022-08-13 NOTE — Progress Notes (Signed)
[facility]  ROUNDING NOTE   Subjective:   Patient remains somnolent Opens eyes for short periods Abdominal distension, tender  Creatinine 4.32 (3.49) Urine output only 70m recorded  Objective:  Vital signs in last 24 hours:  Temp:  [97.5 F (36.4 C)-97.6 F (36.4 C)] 97.6 F (36.4 C) (02/27 0733) Pulse Rate:  [75-80] 80 (02/27 0733) Resp:  [16-18] 18 (02/27 0733) BP: (77-84)/(50-55) 84/55 (02/27 0733) SpO2:  [93 %-100 %] 100 % (02/27 0733) Weight:  [65.9 kg] 65.9 kg (02/27 0500)  Weight change: -0.2 kg Filed Weights   08/11/22 0500 08/12/22 0418 08/13/22 0500  Weight: 67.7 kg 66.1 kg 65.9 kg    Intake/Output: I/O last 3 completed shifts: In: 938.5 [P.O.:100; I.V.:736.9; IV Piggyback:101.6] Out: 250 [Urine:250]   Intake/Output this shift:  No intake/output data recorded.  Physical Exam: General: ill appearing  Head: Normocephalic, atraumatic. Moist oral mucosal membranes  Eyes: Anicteric  Lungs:  Clear to auscultation, normal effort  Heart: Regular rate and rhythm  Abdomen:  Soft, tender, distended  Extremities:  No peripheral edema.  Neurologic: Somnolent  Skin: No lesions  Access: None    Basic Metabolic Panel: Recent Labs  Lab 08/05/2022 1425 08/10/22 0407 08/11/22 0816 08/12/22 0807  NA 130* 132* 132* 136  K 4.8 4.8 5.1 5.3*  CL 101 106 107 109  CO2 17* 14* 10* 13*  GLUCOSE 93 74 103* 127*  BUN 55* 53* 55* 67*  CREATININE 3.45* 3.23* 3.49* 4.32*  CALCIUM 8.5* 8.3* 7.9* 7.8*     Liver Function Tests: Recent Labs  Lab 08/04/2022 1425 08/10/22 0407  AST 13* 36  ALT 7 10  ALKPHOS 135* 205*  BILITOT 0.9 1.4*  PROT 5.6* 5.6*  ALBUMIN 2.1* 2.9*    Recent Labs  Lab 08/08/2022 1425  LIPASE 24    Recent Labs  Lab 08/11/22 1753  AMMONIA 55*     CBC: Recent Labs  Lab 08/12/2022 1425 08/10/22 0407 08/11/22 0816 08/12/22 0807 08/13/22 0815  WBC 12.5* 11.8* 17.6* 20.1* 14.8*  NEUTROABS  --   --  14.8* 17.6* 12.3*   HGB 9.0* 8.9* 9.3* 9.3* 8.5*  HCT 28.3* 28.0* 28.8* 29.5* 27.1*  MCV 96.6 97.2 96.3 96.7 97.5  PLT 189 144* 120* 144* 109*     Cardiac Enzymes: No results for input(s): "CKTOTAL", "CKMB", "CKMBINDEX", "TROPONINI" in the last 168 hours.  BNP: Invalid input(s): "POCBNP"  CBG: Recent Labs  Lab 07/20/2022 2008  GLUCAP 89     Microbiology: Results for orders placed or performed during the hospital encounter of 07/28/2022  Culture, blood (Routine X 2) w Reflex to ID Panel     Status: None (Preliminary result)   Collection Time: 07/30/2022  2:25 PM   Specimen: BLOOD  Result Value Ref Range Status   Specimen Description BLOOD LEFT ANTECUBITAL  Final   Special Requests   Final    BOTTLES DRAWN AEROBIC AND ANAEROBIC Blood Culture adequate volume   Culture   Final    NO GROWTH 4 DAYS [facility] [address], [place] [address]   Report Status PENDING  Incomplete  Culture, blood (Routine X 2) w Reflex to ID Panel     Status: None (Preliminary result)   Collection Time: 07/21/2022  2:27 PM   Specimen: BLOOD  Result Value Ref Range Status   Specimen Description BLOOD RIGHT ANTECUBITAL  Final   Special Requests   Final    BOTTLES DRAWN AEROBIC AND  ANAEROBIC Blood Culture adequate volume   Culture   Final    NO GROWTH 4 DAYS Performed at [facility], [address] [place], [place] [address]    Report Status PENDING  Incomplete  Body fluid culture w Gram Stain     Status: None   Collection Time: 08/07/2022  4:59 PM   Specimen: Peritoneal Washings; Body Fluid  Result Value Ref Range Status   Specimen Description   Final    PERITONEAL Performed at [facility], [address] [place], [place] [address]    Special Requests   Final    PERITONEAL Performed at [facility], [address] [place], [place] [address]    Gram Stain   Final    RARE WBC PRESENT, PREDOMINANTLY PMN NO ORGANISMS SEEN    Culture   Final     NO GROWTH 3 DAYS Performed at [facility], [place] [address], [place], [place] [address]    Report Status 08/13/2022 FINAL  Final  MRSA Next Gen by PCR, Nasal     Status: None   Collection Time: 08/07/2022  9:57 PM   Specimen: Nasal Mucosa; Nasal Swab  Result Value Ref Range Status   MRSA by PCR Next Gen NOT DETECTED NOT DETECTED Final    Comment: (NOTE) The GeneXpert MRSA Assay (FDA approved for NASAL specimens only), is one component of a comprehensive MRSA colonization surveillance program. It is not intended to diagnose MRSA infection nor to guide or monitor treatment for MRSA infections. Test performance is not FDA approved in patients less than 65 years old. Performed at [facility], [address] [place], [place] [address]   Body fluid culture w Gram Stain     Status: None (Preliminary result)   Collection Time: 08/11/22  8:45 AM   Specimen: PATH Cytology Peritoneal fluid  Result Value Ref Range Status   Specimen Description   Final    PERITONEAL Performed at [facility], [address] [place], [place] [address]    Special Requests   Final    NONE Performed at [facility], [address] [place], [place] [address]    Gram Stain   Final    WBC PRESENT, PREDOMINANTLY PMN NO ORGANISMS SEEN CYTOSPIN SMEAR    Culture   Final    NO GROWTH 2 DAYS Performed at [facility], [place] [address], [place], [place] [address]    Report Status PENDING  Incomplete    Coagulation Studies: No results for input(s): "LABPROT", "INR" in the last 72 hours.   Urinalysis: No results for input(s): "COLORURINE", "LABSPEC", "PHURINE", "GLUCOSEU", "HGBUR", "BILIRUBINUR", "KETONESUR", "PROTEINUR", "UROBILINOGEN", "NITRITE", "LEUKOCYTESUR" in the last 72 hours.  Invalid input(s): "APPERANCEUR"     Imaging: No results found.   Medications:    albumin human Stopped (08/11/22 1252)   cefTRIAXone (ROCEPHIN)  IV Stopped (08/12/22 0900)    octreotide (SANDOSTATIN) 500 mcg in sodium chloride 0.9 % 250 mL (2 mcg/mL) infusion 50 mcg/hr (08/13/22 1018)   thiamine (VITAMIN B1) injection 500 mg (08/12/22 2243)    Chlorhexidine Gluconate Cloth  6 each Topical Daily   heparin injection (subcutaneous)  5,000 Units Subcutaneous Q12H   lactulose  20 g Oral BID   midodrine  15 mg Oral TID WC   rOPINIRole  1 mg Oral BID   sodium bicarbonate  650 mg Oral TID   acetaminophen **OR** acetaminophen, albumin human, hydrALAZINE, HYDROmorphone (DILAUDID) injection, LORazepam, LORazepam, nicotine, ondansetron **OR**  ondansetron (ZOFRAN) IV, oxyCODONE, senna-docusate, zolpidem  Assessment/ Plan:  Mr. Alex Green is a 57 y.o.  male found to have large ascites and lower extremity edema that had been getting worse for the last 2-3 weeks. Patient is a poor historian and history taken mostly from the chart. Patient has experienced over a 100 pound weight loss in the last year. Patient quit drinking several years ago. Continues to smoke.   Acute kidney injury: baseline creatinine of 1.35, GFR of greater than 60 on 01/16/22. Urinalysis with hematuria. Urine sodium is 14, usually hepatorenal syndrome has a urine sodium of less than 10. Volume expansion with IV fluids was not done due to patient's ascites and anasarca. Patient was given IV albumin 50g on admission. Patient has been started on midodrine and octreotide for presumed hepatorenal syndrome.  Creatinine remains elevated, decreased urine output Has not received Midodrine due to decreased mentation. Will stop octreotide.  Hold all diuretics, ACE-I/ARB, NSAIDs and any nephrotoxic agents. Avoid IV contrast.  Renally dose all medications  Due to severe illness and poor prognosis, patient would not benefit from dialysis, and would not tolerate long term dialysis.  Continue palliative care consult to establish [place]   Lab Results  Component Value Date   CREATININE 4.32 (H) 08/12/2022   CREATININE 3.49  (H) 08/11/2022   CREATININE 3.23 (H) 08/10/2022    Intake/Output Summary (Last 24 hours) at 08/13/2022 1117 Last data filed at 08/13/2022 0200 Gross per 24 hour  Intake 327.26 ml  Output 50 ml  Net 277.26 ml    Hyponatremia: hypervolemic. 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 Negative for viral hepatitis on 08/01/22 at [facility].  Corrected to 136 1533m fluid restriction   Acute metabolic acidosis: secondary to acute kidney injury.  Serum bicarb 13, no updated labs Continue PO sodium bicarbonate.    Anemia and thrombocytopenia: with hypercoagulable state. No vitamin D given on this admission. Heme occult was positive. Suspect patient has a subclinical GI bleed from portal hypertension.   A. Hemoglobin 8.5, monitoring for now   Hypotension: secondary to liver disease Blood pressure soft. Midodrine held due to mentation Hold all hypertensive agents.    Spontaneous Bacterial Peritonitis: cultures are pending. Status post diagnostic paracentesis on 2/23 and therapeutic paracentesis on 2/25.  Continue ceftriaxone     End Stage Liver Disease: hepatic cirrhosis on abdominal ultrasound. No tumor lesions identified. MELD score calculated on 2/25 is 29 giving a 19% mortality in 90 days.  A. Paracentesis completed on 08/11/22, 5L removed.     LOS: 4 SHanson2/27/202411:17 AM

## 2022-08-13 NOTE — Progress Notes (Signed)
PROGRESS NOTE    Alex Green  [id number] DOB: Apr 05, 1966 DOA: 08/03/2022 PCP: Olin Hauser, DO    Brief Narrative:  57 year old male with history of remote alcohol abuse, hypertension, non-insulin-dependent diabetes mellitus, generalized anxiety disorder, unintentional weight loss, who presents emergency department for chief concerns of distended abdomen.  He reports swelling and distention of is belly for the last 2-3 weeks. He denies fever at home. He endorses generalized diffused abdominal pain, that is dull. He reprots the last time he drank was about 2-3 years ago. He was a heavy drinker and then quit.   He endorses unintentional weight loss of over 100 pounds in the last year.   He reports the pain is at least an 8/10, persistent.   2/24: Patient remains hemodynamically stable.  No hemodynamically significant blood loss noted.  Creatinine improved.  Patient was retaining urine.  Foley placed.  850 cc urine liberated.  Stable for transfer to medical floor.  2/25: Status post large-volume paracentesis.  5 L fluid removed.  Remains on Rocephin.  Creatinine continues to worsen.  Nephrology engaged  2/26: Creatinine continues to worsen.  Suspicion for hepatorenal syndrome is growing.  Patient very lethargic  2/27: Urine output minimal.  Mental status remains poor.  Assessment & Plan:   Principal Problem:   Hepatorenal syndrome ([place]) Active Problems:   Essential hypertension   GAD (generalized anxiety disorder)   GERD (gastroesophageal reflux disease)   Tobacco abuse   Hyperlipidemia associated with type 2 diabetes mellitus (HCC)   Major depressive disorder, recurrent, moderate (HCC)   Abdominal pain   Unintentional weight loss   Hyponatremia   AKI (acute kidney injury) ([place])   Alcoholic cirrhosis of liver with ascites (HCC)   SBP (spontaneous bacterial peritonitis) ([place])  Concern for hepatorenal syndrome Oliguric acute kidney injury Patient presented  with AKI and decreased urine output.  Hepatorenal syndrome on differential.  Postobstructive uropathy in the setting of ascites also on differential.  Creatinine worsening as of 2/25.  Foley catheter placed 2/24  Kidney function continues to deteriorate.  RN unable to collect enough blood to run BMP. plan: Nephrology consulted Octreotide stopped For renal replacement therapy Continue midodrine, dose increased to 15 3 times daily Continue Foley catheter Monitor U OP Avoid nephrotoxins Guarded prognosis  SBP (spontaneous bacterial peritonitis) ([place]) Diagnostic paracentesis with concern for spontaneous bacterial peritonitis.  Received vancomycin, cefepime, metronidazole. Paracentesis completed 2/25.  5 L fluid removed Plan: Continue IV Rocephin for SBP treatment No further albumin   AKI (acute kidney injury) ([place]) See above for plan Foley catheter in place   Hyponatremia Chronic likely in the setting of chronic liver disease  Unintentional weight loss With history of alcohol abuse along with liver cirrhosis on ultrasound - There is concern for carcinoma however a CT of the abdomen and pelvis with contrast needs to be avoided at this time given acute kidney injury - Will need to pursue advanced imaging modality.  On hold now pending acute kidney injury.  If kidney function recovered will pursue CT abdomen pelvis with contrast versus MRI abdomen -Unable to collect sufficient blood sample for alpha-fetoprotein   Abdominal pain Suspect secondary SBP in setting of abdominal ascites and patient with liver cirrhosis Diagnostic paracentesis done in ED. significant for SBP.  On IV Rocephin as above.  Status post therapeutic paracentesis.  5 L fluid removed.  Hyperlipidemia associated with type 2 diabetes mellitus ([place]) No longer on antihyperglycemic agents due to significant weight loss   Tobacco  abuse PRN nicotine patch   Essential hypertension BP meds on hold  Goals of  care Patient with evidence of advanced alcoholic cirrhosis and decompensated liver disease.  Also given supposed extreme weight loss I have very high concern for underlying malignancy.  Prognosis poor.  At this point I recommend de-escalation of care and transition to comfort measures.  Lengthy conversation with family members relating clinical situation on 2/26.  Palliative care to follow-up.   DVT prophylaxis: SCD Code Status: Full Family Communication:SO [facility] [phone] on 2/24, family meeting at bedside 2/26 Disposition Plan: Status is: Inpatient Remains inpatient appropriate because: Decompensated hepatic cirrhosis   Level of care: Telemetry Medical  Consultants:  Palliative care  Procedures:  Paracentesis ordered  Antimicrobials: Rocephin    Subjective: Seen and examined.  Lethargic.  Difficult to arouse  Objective: Vitals:   08/12/22 1952 08/13/22 0409 08/13/22 0500 08/13/22 0733  BP: (!) 81/50 (!) 83/50  (!) 84/55  Pulse: 75 80  80  Resp: '18 18  18  '$ Temp: 97.6 F (36.4 C) (!) 97.5 F (36.4 C)  97.6 F (36.4 C)  TempSrc:    Oral  SpO2: 93% 100%  100%  Weight:   65.9 kg   Height:        Intake/Output Summary (Last 24 hours) at 08/13/2022 1356 Last data filed at 08/13/2022 0200 Gross per 24 hour  Intake 327.26 ml  Output 50 ml  Net 277.26 ml   Filed Weights   08/11/22 0500 08/12/22 0418 08/13/22 0500  Weight: 67.7 kg 66.1 kg 65.9 kg    Examination:  General exam: Lethargic.  Obtunded.  Appears acute and chronically ill Respiratory system: Bibasilar crackles, decreased respiratory effort, 2 L Cardiovascular system: S1S2, RRR, no murmur Gastrointestinal system: Distended, nontender, decreased bowel sounds Central nervous system:, Lethargic, oriented to person and place Extremities: Symmetric 5 x 5 power. Skin: No rashes, lesions or ulcers Psychiatry: Judgement and insight appear impaired. Mood & affect flattened.     Data Reviewed: I have  personally reviewed following labs and imaging studies  CBC: Recent Labs  Lab 07/25/2022 1425 08/10/22 0407 08/11/22 0816 08/12/22 0807 08/13/22 0815  WBC 12.5* 11.8* 17.6* 20.1* 14.8*  NEUTROABS  --   --  14.8* 17.6* 12.3*  HGB 9.0* 8.9* 9.3* 9.3* 8.5*  HCT 28.3* 28.0* 28.8* 29.5* 27.1*  MCV 96.6 97.2 96.3 96.7 97.5  PLT 189 144* 120* 144* 0000000*   Basic Metabolic Panel: Recent Labs  Lab 08/12/2022 1425 08/10/22 0407 08/11/22 0816 08/12/22 0807  NA 130* 132* 132* 136  K 4.8 4.8 5.1 5.3*  CL 101 106 107 109  CO2 17* 14* 10* 13*  GLUCOSE 93 74 103* 127*  BUN 55* 53* 55* 67*  CREATININE 3.45* 3.23* 3.49* 4.32*  CALCIUM 8.5* 8.3* 7.9* 7.8*   GFR: Estimated Creatinine Clearance: 17.4 mL/min (A) (by C-G formula based on SCr of 4.32 mg/dL (H)). Liver Function Tests: Recent Labs  Lab 08/08/2022 1425 08/10/22 0407  AST 13* 36  ALT 7 10  ALKPHOS 135* 205*  BILITOT 0.9 1.4*  PROT 5.6* 5.6*  ALBUMIN 2.1* 2.9*   Recent Labs  Lab 08/08/2022 1425  LIPASE 24   Recent Labs  Lab 08/11/22 1753  AMMONIA 55*   Coagulation Profile: Recent Labs  Lab 07/31/2022 1425 08/10/22 0407  INR 1.5* 1.7*   Cardiac Enzymes: No results for input(s): "CKTOTAL", "CKMB", "CKMBINDEX", "TROPONINI" in the last 168 hours. BNP (last 3 results) No results for input(s): "PROBNP" in  the last 8760 hours. HbA1C: No results for input(s): "HGBA1C" in the last 72 hours. CBG: Recent Labs  Lab 07/19/2022 2008  GLUCAP 89   Lipid Profile: No results for input(s): "CHOL", "HDL", "LDLCALC", "TRIG", "CHOLHDL", "LDLDIRECT" in the last 72 hours. Thyroid Function Tests: No results for input(s): "TSH", "T4TOTAL", "FREET4", "T3FREE", "THYROIDAB" in the last 72 hours. Anemia Panel: No results for input(s): "VITAMINB12", "FOLATE", "FERRITIN", "TIBC", "IRON", "RETICCTPCT" in the last 72 hours. Sepsis Labs: Recent Labs  Lab 08/08/2022 1425 08/02/2022 1623 08/10/22 1435  PROCALCITON 0.79  --   --   LATICACIDVEN   --  1.2 1.3    Recent Results (from the past 240 hour(s))  Culture, blood (Routine X 2) w Reflex to ID Panel     Status: None (Preliminary result)   Collection Time: 08/12/2022  2:25 PM   Specimen: BLOOD  Result Value Ref Range Status   Specimen Description BLOOD LEFT ANTECUBITAL  Final   Special Requests   Final    BOTTLES DRAWN AEROBIC AND ANAEROBIC Blood Culture adequate volume   Culture   Final    NO GROWTH 4 DAYS Performed at [facility], [address], [place], [place] [address]    Report Status PENDING  Incomplete  Culture, blood (Routine X 2) w Reflex to ID Panel     Status: None (Preliminary result)   Collection Time: 08/02/2022  2:27 PM   Specimen: BLOOD  Result Value Ref Range Status   Specimen Description BLOOD RIGHT ANTECUBITAL  Final   Special Requests   Final    BOTTLES DRAWN AEROBIC AND ANAEROBIC Blood Culture adequate volume   Culture   Final    NO GROWTH 4 DAYS Performed at [facility], [address], [place], [place] [address]    Report Status PENDING  Incomplete  Body fluid culture w Gram Stain     Status: None   Collection Time: 08/11/2022  4:59 PM   Specimen: Peritoneal Washings; Body Fluid  Result Value Ref Range Status   Specimen Description   Final    PERITONEAL Performed at [facility], [address] [place], [place] [address]    Special Requests   Final    PERITONEAL Performed at [facility], [address] [place], [place] [address]    Gram Stain   Final    RARE WBC PRESENT, PREDOMINANTLY PMN NO ORGANISMS SEEN    Culture   Final    NO GROWTH 3 DAYS Performed at [facility], [place] [address], [place], [place] [address]    Report Status 08/13/2022 FINAL  Final  MRSA Next Gen by PCR, Nasal     Status: None   Collection Time: 07/30/2022  9:57 PM   Specimen: Nasal Mucosa; Nasal Swab  Result Value Ref Range Status   MRSA by PCR Next Gen NOT DETECTED NOT DETECTED Final    Comment: (NOTE) The  GeneXpert MRSA Assay (FDA approved for NASAL specimens only), is one component of a comprehensive MRSA colonization surveillance program. It is not intended to diagnose MRSA infection nor to guide or monitor treatment for MRSA infections. Test performance is not FDA approved in patients less than 69 years old. Performed at [facility], [address] [place], [place] [address]   Body fluid culture w Gram Stain     Status: None (Preliminary result)   Collection Time: 08/11/22  8:45 AM   Specimen: PATH Cytology Peritoneal fluid  Result Value Ref Range Status  Specimen Description   Final    PERITONEAL Performed at [facility], [address] [place], [place] [address]    Special Requests   Final    NONE Performed at [facility], [address] [place], [place] [address]    Gram Stain   Final    WBC PRESENT, PREDOMINANTLY PMN NO ORGANISMS SEEN CYTOSPIN SMEAR    Culture   Final    NO GROWTH 2 DAYS Performed at [facility], [place] [address] [place], [place] [address]    Report Status PENDING  Incomplete         Radiology Studies: No results found.      Scheduled Meds:  Chlorhexidine Gluconate Cloth  6 each Topical Daily   heparin injection (subcutaneous)  5,000 Units Subcutaneous Q12H   lactulose  20 g Oral BID   midodrine  15 mg Oral TID WC   rOPINIRole  1 mg Oral BID   sodium bicarbonate  650 mg Oral TID   Continuous Infusions:  albumin human Stopped (08/11/22 1252)   cefTRIAXone (ROCEPHIN)  IV Stopped (08/12/22 0900)   thiamine (VITAMIN B1) injection 500 mg (08/12/22 2243)     LOS: 4 days      Sidney Ace, MD [facility]   If 7PM-7AM, please contact night-coverage  08/13/2022, 1:56 PM

## 2022-08-13 NOTE — Progress Notes (Signed)
Patient ID: TAFF LEET, male   DOB: 28-Oct-1965, 56 y.o.   MRN: [id number]    Progress Note from the Palliative Medicine Team at [facility]   Patient Name: Alex Green        Date: 08/13/2022 DOB: 1966/05/21  Age: 57 y.o. MRN#: [id number] Attending Physician: Alex Ace, MD Primary Care Physician: Alex Hauser, DO Admit Date: 07/25/2022   Medical records reviewed, assessed the patient and discussed with treatment team.   57 y.o. male  admitted on 07/25/2022 with   h/o alcohol abuse, hypertension, non-insulin-dependent diabetes mellitus, generalized anxiety disorder, unintentional weight loss, who presents emergency department for chief concerns of distended abdomen.    Reported  swelling and distention of is belly for the last 2-3 weeks.   Unintentional weight loss of over 100 lbs in the last year.   2/25: Status post large-volume paracentesis.  5 L fluid removed.  Remains on Rocephin.  Creatinine continues to worsen.  Nephrology involved   2/26: Creatinine continues to worsen.  Suspicion for hepatorenal syndrome  Patient very lethargic Detailed [place] conversation yesterday with family.  SO/Alex Green  understands seriousness of current medical situation and associated poor prognosis, however she is struggling with decisions regarding EOL.   She herself has significant health issues    This NP assessed patient at the bedside as a follow up to  yesterday's [place].  Alex Green is minimally responsive, minimal po intake, distention of abdomin, he appears to be transitioning at EOL.  I was able to speak to [facility] again today by telephone, and her god-daughter/Alex Green was on the call too. Medical update given.  No furtehr decisions made at this time.  COntinue with current medcial interventions    Education offered on natural trajectory and expectations at EOL.       Education offered today regarding  the importance of continued conversation with  family and their  medical providers regarding overall plan of care and treatment options,  ensuring decisions are within the context of the patients values and GOCs.  Questions and concerns addressed   Discussed with Alex Green   Time: 50 minutes   Detailed review of medical records ( labs, imaging, vital signs), medically appropriate exam ( MS, skin, resp)   discussed with treatment team, counseling and education to patient, family, staff, documenting clinical information, medication management, coordination of care    Alex Lessen NP  Palliative Medicine Team Team Phone # [phone] Pager [phone]

## 2022-08-14 DIAGNOSIS — N179 Acute kidney failure, unspecified: Secondary | ICD-10-CM | POA: Diagnosis not present

## 2022-08-14 DIAGNOSIS — R140 Abdominal distension (gaseous): Secondary | ICD-10-CM | POA: Diagnosis not present

## 2022-08-14 DIAGNOSIS — E1169 Type 2 diabetes mellitus with other specified complication: Secondary | ICD-10-CM

## 2022-08-14 DIAGNOSIS — K652 Spontaneous bacterial peritonitis: Secondary | ICD-10-CM

## 2022-08-14 DIAGNOSIS — K7031 Alcoholic cirrhosis of liver with ascites: Secondary | ICD-10-CM | POA: Diagnosis not present

## 2022-08-14 DIAGNOSIS — E785 Hyperlipidemia, unspecified: Secondary | ICD-10-CM

## 2022-08-14 DIAGNOSIS — R1084 Generalized abdominal pain: Secondary | ICD-10-CM | POA: Diagnosis not present

## 2022-08-14 DIAGNOSIS — K767 Hepatorenal syndrome: Secondary | ICD-10-CM | POA: Diagnosis not present

## 2022-08-14 LAB — CULTURE, BLOOD (ROUTINE X 2)
Culture: NO GROWTH
Culture: NO GROWTH
Special Requests: ADEQUATE
Special Requests: ADEQUATE

## 2022-08-14 LAB — BODY FLUID CULTURE W GRAM STAIN: Culture: NO GROWTH

## 2022-08-16 NOTE — Plan of Care (Signed)

## 2022-08-16 NOTE — Progress Notes (Signed)
   08/08/2022 1400  Spiritual Encounters  Type of Visit Initial  Care provided to: Family  Referral source Nurse (RN/NT/LPN)  Reason for visit Patient death  OnCall Visit Yes   Chaplain received call from Nurse regarding the need for Chaplain due to patient death.  Girlfriend's mother at bedside.  Chaplain met and sat with girlfriend's mother providing compassionate presence and reflective listening.  Girlfriend and other family members in route to hospital. Chaplain paged for another emergent patient need. Report given to another Chaplain to follow up. Follow as needed.

## 2022-08-16 NOTE — Progress Notes (Signed)
[facility]  ROUNDING NOTE   Subjective:   Somnolent Minimal response to verbal and painful stimuli   Objective:  Vital signs in last 24 hours:  Temp:  [97.1 F (36.2 C)-97.8 F (36.6 C)] 97.8 F (36.6 C) 09/11/2022 0748) Pulse Rate:  [75-79] 77 2022/09/11 0748) Resp:  [17-18] 17 11-Sep-2022 0748) BP: (72-110)/(48-62) 110/62 Sep 11, 2022 0748) SpO2:  [100 %] 100 % 09/11/22 0748) Weight:  [64.4 kg] 64.4 kg 11-Sep-2022 0500)  Weight change: -1.5 kg Filed Weights   08/12/22 0418 08/13/22 0500 09/11/22 0500  Weight: 66.1 kg 65.9 kg 64.4 kg    Intake/Output: I/O last 3 completed shifts: In: 327.3 [I.V.:225.7; IV Piggyback:101.6] Out: -    Intake/Output this shift:  No intake/output data recorded.  Physical Exam: General: ill appearing  Head: Normocephalic, atraumatic. Dry oral mucosal membranes  Eyes: Anicteric  Lungs:  Clear to auscultation, normal effort  Heart: Regular rate and rhythm  Abdomen:  Soft, tender, distended  Extremities:  No peripheral edema.  Neurologic: Somnolent  Skin: No lesions  Access: None    Basic Metabolic Panel: Recent Labs  Lab 08/08/2022 1425 08/10/22 0407 08/11/22 0816 08/12/22 0807  NA 130* 132* 132* 136  K 4.8 4.8 5.1 5.3*  CL 101 106 107 109  CO2 17* 14* 10* 13*  GLUCOSE 93 74 103* 127*  BUN 55* 53* 55* 67*  CREATININE 3.45* 3.23* 3.49* 4.32*  CALCIUM 8.5* 8.3* 7.9* 7.8*     Liver Function Tests: Recent Labs  Lab 07/22/2022 1425 08/10/22 0407  AST 13* 36  ALT 7 10  ALKPHOS 135* 205*  BILITOT 0.9 1.4*  PROT 5.6* 5.6*  ALBUMIN 2.1* 2.9*    Recent Labs  Lab 08/07/2022 1425  LIPASE 24    Recent Labs  Lab 08/11/22 1753  AMMONIA 55*     CBC: Recent Labs  Lab 08/05/2022 1425 08/10/22 0407 08/11/22 0816 08/12/22 0807 08/13/22 0815  WBC 12.5* 11.8* 17.6* 20.1* 14.8*  NEUTROABS  --   --  14.8* 17.6* 12.3*  HGB 9.0* 8.9* 9.3* 9.3* 8.5*  HCT 28.3* 28.0* 28.8* 29.5* 27.1*  MCV 96.6 97.2 96.3 96.7 97.5  PLT 189 144* 120*  144* 109*     Cardiac Enzymes: No results for input(s): "CKTOTAL", "CKMB", "CKMBINDEX", "TROPONINI" in the last 168 hours.  BNP: Invalid input(s): "POCBNP"  CBG: Recent Labs  Lab 08/01/2022 2008  GLUCAP 89     Microbiology: Results for orders placed or performed during the hospital encounter of 08/04/2022  Culture, blood (Routine X 2) w Reflex to ID Panel     Status: None   Collection Time: 08/11/2022  2:25 PM   Specimen: BLOOD  Result Value Ref Range Status   Specimen Description BLOOD LEFT ANTECUBITAL  Final   Special Requests   Final    BOTTLES DRAWN AEROBIC AND ANAEROBIC Blood Culture adequate volume   Culture   Final    NO GROWTH 5 DAYS Performed at [facility], [address] [place], [place] [address]    Report Status Sep 11, 2022 FINAL  Final  Culture, blood (Routine X 2) w Reflex to ID Panel     Status: None   Collection Time: 08/01/2022  2:27 PM   Specimen: BLOOD  Result Value Ref Range Status   Specimen Description BLOOD RIGHT ANTECUBITAL  Final   Special Requests   Final    BOTTLES DRAWN AEROBIC AND ANAEROBIC Blood Culture adequate volume   Culture   Final    NO GROWTH  5 DAYS Performed at [facility], [address] [place], [place] [address]    Report Status September 07, 2022 FINAL  Final  Body fluid culture w Gram Stain     Status: None   Collection Time: 08/06/2022  4:59 PM   Specimen: Peritoneal Washings; Body Fluid  Result Value Ref Range Status   Specimen Description   Final    PERITONEAL Performed at [facility], [address] [place], [place] [address]    Special Requests   Final    PERITONEAL Performed at [facility], [address] [place], [place] [address]    Gram Stain   Final    RARE WBC PRESENT, PREDOMINANTLY PMN NO ORGANISMS SEEN    Culture   Final    NO GROWTH 3 DAYS Performed at [facility], [place] [address], [place], [place] [address]    Report Status 08/13/2022 FINAL  Final  MRSA Next  Gen by PCR, Nasal     Status: None   Collection Time: 07/21/2022  9:57 PM   Specimen: Nasal Mucosa; Nasal Swab  Result Value Ref Range Status   MRSA by PCR Next Gen NOT DETECTED NOT DETECTED Final    Comment: (NOTE) The GeneXpert MRSA Assay (FDA approved for NASAL specimens only), is one component of a comprehensive MRSA colonization surveillance program. It is not intended to diagnose MRSA infection nor to guide or monitor treatment for MRSA infections. Test performance is not FDA approved in patients less than 36 years old. Performed at [facility], [address] [place], [place] [address]   Body fluid culture w Gram Stain     Status: None (Preliminary result)   Collection Time: 08/11/22  8:45 AM   Specimen: PATH Cytology Peritoneal fluid  Result Value Ref Range Status   Specimen Description   Final    PERITONEAL Performed at [facility], [address], [place], [place] [address]    Special Requests   Final    NONE Performed at [facility], [address] [place], [place] [address]    Gram Stain   Final    WBC PRESENT, PREDOMINANTLY PMN NO ORGANISMS SEEN CYTOSPIN SMEAR    Culture   Final    NO GROWTH 2 DAYS Performed at [facility], [place] [address], [place],  [address]    Report Status PENDING  Incomplete    Coagulation Studies: No results for input(s): "LABPROT", "INR" in the last 72 hours.   Urinalysis: No results for input(s): "COLORURINE", "LABSPEC", "PHURINE", "GLUCOSEU", "HGBUR", "BILIRUBINUR", "KETONESUR", "PROTEINUR", "UROBILINOGEN", "NITRITE", "LEUKOCYTESUR" in the last 72 hours.  Invalid input(s): "APPERANCEUR"     Imaging: No results found.   Medications:    cefTRIAXone (ROCEPHIN)  IV 2 g (2022-09-07 0909)   thiamine (VITAMIN B1) injection 500 mg (07-Sep-2022 0913)    Chlorhexidine Gluconate Cloth  6 each Topical Daily   heparin injection (subcutaneous)  5,000 Units Subcutaneous Q12H   lactulose  20 g  Oral BID   midodrine  15 mg Oral TID WC   rOPINIRole  1 mg Oral BID   sodium bicarbonate  650 mg Oral TID   acetaminophen **OR** acetaminophen, HYDROmorphone (DILAUDID) injection, LORazepam, LORazepam, nicotine, ondansetron **OR** ondansetron (ZOFRAN) IV, oxyCODONE, senna-docusate, zolpidem  Assessment/ Plan:  Mr. Alex Green is a 57 y.o.  male found to have large ascites and lower extremity edema that had been getting worse for the last 2-3 weeks. Patient is a poor historian  and history taken mostly from the chart. Patient has experienced over a 100 pound weight loss in the last year. Patient quit drinking several years ago. Continues to smoke.   Acute kidney injury: baseline creatinine of 1.35, GFR of greater than 60 on 01/16/22. Urinalysis with hematuria. Urine sodium is 14, usually hepatorenal syndrome has a urine sodium of less than 10. Volume expansion with IV fluids was not done due to patient's ascites and anasarca. Patient was given IV albumin 50g on admission. Patient has been started on midodrine and octreotide for presumed hepatorenal syndrome.  Patient has been refusing labs Midodrine and octreotide stopped due to decreased mentation.  Hold all diuretics, ACE-I/ARB, NSAIDs and any nephrotoxic agents. Avoid IV contrast.  Renally dose all medications  Due to severe illness and poor prognosis, patient would not benefit from dialysis, and would not tolerate long term dialysis.  Continue palliative care consult to establish [place]   Lab Results  Component Value Date   CREATININE 4.32 (H) 08/12/2022   CREATININE 3.49 (H) 08/11/2022   CREATININE 3.23 (H) 08/10/2022    Intake/Output Summary (Last 24 hours) at 08-16-2022 1119 Last data filed at 16-Aug-2022 1044 Gross per 24 hour  Intake 0 ml  Output --  Net 0 ml    Hyponatremia: hypervolemic. 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 Negative for viral hepatitis on 08/01/22 at [facility].  Continue  151m fluid restriction   Acute metabolic acidosis: secondary to acute kidney injury.  Serum bicarb 13, no updated labs Continue PO sodium bicarbonate.    Anemia and thrombocytopenia: with hypercoagulable state. No vitamin D given on this admission. Heme occult was positive. Suspect patient has a subclinical GI bleed from portal hypertension.    Hypotension: secondary to liver disease Blood pressure stable Midodrine stoppped Hold all hypertensive agents.    Spontaneous Bacterial Peritonitis: cultures are pending. Status post diagnostic paracentesis on 2/23 and therapeutic paracentesis on 2/25.  Continue ceftriaxone     End Stage Liver Disease: hepatic cirrhosis on abdominal ultrasound. No tumor lesions identified. MELD score calculated on 2/25 is 29 giving a 19% mortality in 90 days.  A. Paracentesis completed on 08/11/22, 5L removed.   Patient mentation has declined rapidly over the past couple days. Inconsistent medication compliance due to mentation is likely and will form a barrier with treatment. Continue to palliative care rounding for [place]    LOS: 5   2March 01, 20241:19 AM

## 2022-08-16 NOTE — Progress Notes (Signed)
Alex Green visited the family at the Pt deathbed, This Alex Green offered a heartfelt prayer for the family and remembered the deceased.   Aug 24, 2022 1500  Spiritual Encounters  Type of Visit Follow up  Care provided to: Pt and family  Conversation partners present during encounter Other (comment) (Chaplain Alex Green update me on the death before she left for supervision time.)  Referral source Chaplain team  Reason for visit Patient death  OnCall Visit Yes  Spiritual Framework  Presenting Themes Significant life change;Rituals and practive  Values/beliefs Life after death (Pt family was very concerned if Pt had said a prayer to receive Jesus before he died. Fiance kept asking if pt talked to someone about this. I mentioned to her that I did visit the patient before but he was asleep.)  Community/Connection Family  Family Stress Factors Other (Comment) (If their loved one went to heaven)  Interventions  Spiritual Care Interventions Made Compassionate presence;Reflective listening;Narrative/life review;Prayer;Mindfulness intervention  Intervention Outcomes  Outcomes Reduced isolation;Connection to spiritual care  Spiritual Care Plan  Spiritual Care Issues Still Outstanding No further spiritual care needs at this time (see row info)

## 2022-08-16 NOTE — Progress Notes (Signed)
Patient ID: AYAL LAGOW, male   DOB: 01-12-1966, 57 y.o.   MRN: [id number]    Progress Note from the Palliative Medicine Team at [facility]   Patient Name: Alex Green        Date: 03-Sep-2022 DOB: [birth year]  Age: 57 y.o. MRN#: [id number] Attending Physician: Kerney Elbe, DO Primary Care Physician: Olin Hauser, DO Admit Date: 08/08/2022   Medical records reviewed, assessed the patient and discussed with treatment team.   57 y.o. male  admitted on 08/11/2022 with   h/o alcohol abuse, hypertension, non-insulin-dependent diabetes mellitus, generalized anxiety disorder, unintentional weight loss, who presents emergency department for chief concerns of distended abdomen.    Reported  swelling and distention of is belly for the last 2-3 weeks.   Unintentional weight loss of over 100 lbs in the last year.   2/25: Status post large-volume paracentesis.  5 L fluid removed.  Remains on Rocephin.  Creatinine continues to worsen.  Nephrology involved   2/26: Creatinine continues to worsen.  Suspicion for hepatorenal syndrome  Patient very lethargic Detailed [place] conversation yesterday with family.  2-27: Minimally responsive, taking only sips, appears generally uncomfortable   This NP assessed patient at the bedside as a follow up to  yesterday's [place].  Alex Green is minimally responsive, minimal po intake, distention of abdomin, he appears to be transitioning at EOL.  Tempted to call both significant other and goddaughter.  Left phone messages await callback.  At this time continue with current medical interventions.   Ongoing education and support to family helping them navigate healthcare decisions.  PMT will support as family allows.    Fortunately I believe patient is at end-of-life regardless of medical interventions at this time.  Questions and concerns addressed   Discussed with Dr Alfredia Ferguson   Time: 25 minutes   Detailed review of medical records,  medically appropriate exam ( MS, skin, resp)   discussed with treatment team, counseling and education to patient, family, staff, documenting clinical information, medication management, coordination of care    Wadie Lessen NP  Palliative Medicine Team Team Phone # [phone] Pager [phone]

## 2022-08-16 NOTE — Death Summary Note (Addendum)
DEATH SUMMARY   Patient Details  Name: Alex Green MRN: [id number] DOB: 12-Jan-1966 [id number], Alex Doughty, DO Admission/Discharge Information   Admit Date:  08/13/2022  Date of Death: Date of Death: 2022/08/18  Time of Death: Time of Death: 13  Length of Stay: 5   Principle Cause of death: Acute cardiopulmonary arrest in the setting of decompensated alcoholic liver cirrhosis in the setting of hepatorenal syndrome  Hospital Diagnoses: Principal Problem:   Hepatorenal syndrome ([place]) Active Problems:   Essential hypertension   GAD (generalized anxiety disorder)   GERD (gastroesophageal reflux disease)   Tobacco abuse   Hyperlipidemia associated with type 2 diabetes mellitus ([place])   Major depressive disorder, recurrent, moderate (HCC)   Abdominal pain   Unintentional weight loss   Hyponatremia   AKI (acute kidney injury) ([place])   Alcoholic cirrhosis of liver with ascites ([place])   SBP (spontaneous bacterial peritonitis) [facility])  Hospital Course: Mr. Alex Green is a 57 year old male with history of remote alcohol abuse, hypertension, non-insulin-dependent diabetes mellitus, generalized anxiety disorder, unintentional weight loss, who presents emergency department for chief concerns of distended abdomen.  Initial vitals in the ED showed temperature of 99.1, respiration rate of 21, heart rate of 80, blood pressure 102/69, SpO2 100% on room air.  Serum sodium is 130, potassium 4.8, chloride 101, bicarb 17, BUN of 55, serum creatinine of 3.45, GFR of 20, nonfasting blood glucose 93, WBC 12.5, hemoglobin 9.0, platelets of 189.  ED performed fecal occult, which was mildly positive.  ED treatment: Sodium chloride 500 mL bolus one-time dose.  **Interim History  He reports swelling and distention of is belly for the last 2-3 weeks. He denies fever at home. He endorses generalized diffused abdominal pain, that is dull. He reprots the last time he drank was about 2-3 years ago.  He was a heavy drinker and then quit.   He endorses unintentional weight loss of over 100 pounds in the last year.   He reports the pain is at least an 8/10, persistent.    2/24: Patient remains hemodynamically stable.  No hemodynamically significant blood loss noted.  Creatinine improved.  Patient was retaining urine.  Foley placed.  850 cc urine liberated.  Stable for transfer to medical floor.   2/25: Status post large-volume paracentesis.  5 L fluid removed.  Remains on Rocephin.  Creatinine continues to worsen.  Nephrology engaged   2/26: Creatinine continues to worsen.  Suspicion for hepatorenal syndrome is growing.  Patient very lethargic   2/27: Urine output minimal.  Mental status remains poor.  August 18, 2022: Patient started having some agonal breathing and prognosis was extremely poor.  He was unresponsive to physical verbal stimuli on my examination this morning.  Palliative evaluated and despite his treatments they felt that he was transitioned to end-of-life.  Subsequently the palliative care team attempted to call both the significant other and the goddaughter and left messages for call back.  However family was able to came to bedside and patient subsequently further decompensated and passed away not unexpectedly given his poor prognosis and condition on 08-18-2022 at 1300.  The chaplain was paged and met with the patient's girlfriend and mother.  Assessment and Plan:  Concern for hepatorenal syndrome Oliguric acute kidney injury -Patient presented with AKI and decreased urine output.  Hepatorenal syndrome on differential.  Postobstructive uropathy in the setting of ascites also on differential. -Creatinine worsening as of 2/25.  Foley catheter placed 2/24  -BUN/Cr Trend: Recent Labs  Lab 08/13/22  1425 08/10/22 0407 08/11/22 0816 08/12/22 0807  BUN 55* 53* 55* 67*  CREATININE 3.45* 3.23* 3.49* 4.32*   -Avoid Nephrotoxic Medications, Contrast Dyes, Hypotension and Dehydration to  Ensure Adequate Renal Perfusion and will need to Renally Adjust Meds -Kidney function continues to deteriorate.  RN unable to collect enough blood to run BMP. -Nephrology consulted Octreotide stopped -Given his poor prognosis they did not offer dialysis -Patient's midodrine and octreotide was stopped due to decreased mentation -Subsequently patient continued to worsen and repeat labs were ordered today but however he subsequently passed away prior to this given his further decompensation.  SBP (spontaneous bacterial peritonitis) ([place]) - Diagnostic paracentesis with concern for spontaneous bacterial peritonitis - Pathology consulted/ordered to assess for hepatocarcinoma - Vancomycin, cefepime, metronidazole ordered but this was de-escalated to IV Rocephin for SBP treatment -He was given albumin but no further albumin was given.  Paracentesis was completed 225 and 5 L were removed  AKI (acute kidney injury) ([place]) Metabolic acidosis - Suspect secondary to hepatorenal syndrome - Admit to stepdown -Patient had a metabolic acidosis on the last blood work that was done and showed a CO2 of 13, anion gap of 14, chloride level 109 -Foley catheter was in place  Hyperkalemia -Patient's K+ Level Trend: Recent Labs  Lab 07/31/2022 1425 08/10/22 0407 08/11/22 0816 08/12/22 0807  K 4.8 4.8 5.1 5.3*  -Patient is potassium had elevated on last check in the setting of his worsening renal function  Thrombocytopenia -Setting of alcoholic cirrhosis -Patient's platelet count trend went from: Recent Labs  Lab 08/10/2022 1425 08/10/22 0407 08/11/22 0816 08/12/22 0807 08/13/22 0815  PLT 189 144* 120* 144* 109*  -Subsequently patient decompensated and passed away before repeat could have been obtained  Normocytic Anemia -Hgb/Hct Trend: Recent Labs  Lab 07/20/2022 1425 08/10/22 0407 08/11/22 0816 08/12/22 0807 08/13/22 0815  HGB 9.0* 8.9* 9.3* 9.3* 8.5*  HCT 28.3* 28.0* 28.8* 29.5* 27.1*  MCV  96.6 97.2 96.3 96.7 97.5  -Repeat CBC was ordered for today however patient subsequently passed away prior to blood being able to be drawn  Hyponatremia With elevated kidney injury - Concerning for high MELD-Na score -His sodium was likely low and chronic in the setting of chronic liver disease  Unintentional weight loss With history of alcohol abuse along with liver cirrhosis on ultrasound - There is concern for carcinoma however a CT of the abdomen and pelvis with contrast needs to be avoided at this time given acute kidney injury -Will need to pursue advanced imaging modality.  On hold now pending acute kidney injury.  If kidney function recovered will pursue CT abdomen pelvis with contrast versus MRI abdomen -Unable to collect sufficient blood sample for alpha-fetoprotein and he subsequently passed away  Abdominal pain Suspected secondary SBP in setting of abdominal ascites and patient with liver cirrhosis The admitting physician discussed with emergency medicine provider for bedside paracentesis to obtain blood culture sent, with cell count and pathology evaluation for possible carcinoma - Blood cultures x 2, lactic acid times two ordered - Cefepime and vancomycin ordered and is de-escalated to IV Rocephin -He had a diagnostic paracentesis done in the ED which was significant for SBP - Admit to telemetry cardiac, inpatient -He is status post therapeutic paracentesis and 5 L removed but despite this he continued to worsen  Hyperlipidemia associated with type 2 diabetes mellitus ([place]) - Patient is no longer on home antiglycemic agents due to significant weight loss  Tobacco abuse - As needed nicotine patch ordered  Essential hypertension - No beta-blockade/calcium channel blocker - Blood pressure medications on hold  Goals of care Patient with evidence of advanced alcoholic cirrhosis and decompensated liver disease.  Also given supposed extreme weight loss doctor Laurian Brim had  very high concern for underlying malignancy.  Prognosis poor.  At this point He  recommend de-escalation of care and transition to comfort measures.  Lengthy conversation with family members relating clinical situation on 2/26.  Palliative care to follow-up. -Palliative is involved and they felt that he was transition to end-of-life despite medical treatments.  He was minimally responsive did not take any p.o. intake and had a worsening distended abdomen.  Subsequently he passed away with family at bedside   Hypoalbuminemia -Patient's Albumin Trend: Recent Labs  Lab 07/24/2022 1425 08/10/22 0407  ALBUMIN 2.1* 2.9*   Procedures: Large-volume paracentesis  Consultations: Nephrology and palliative care medicine  The results of significant diagnostics from this hospitalization (including imaging, microbiology, ancillary and laboratory) are listed below for reference.   Significant Diagnostic Studies: US Paracentesis  Result Date: 08/11/2022 INDICATION: 57 year old male presents with abdominal distension, previous imaging showed ascites. Request for therapeutic and diagnostic paracentesis up to 5 L. EXAM: ULTRASOUND GUIDED  PARACENTESIS MEDICATIONS: 10 mL 1% lidocaine COMPLICATIONS: None immediate. PROCEDURE: Informed written consent was obtained from the patient after a discussion of the risks, benefits and alternatives to treatment. A timeout was performed prior to the initiation of the procedure. Initial ultrasound scanning demonstrates a large amount of ascites within the right lower abdominal quadrant. The right lower abdomen was prepped and draped in the usual sterile fashion. 1% lidocaine was used for local anesthesia. Following this, a 6 Fr Safe-T-Centesis catheter was introduced. An ultrasound image was saved for documentation purposes. The paracentesis was performed. The catheter was removed and a dressing was applied. The patient tolerated the procedure well without immediate post procedural  complication. FINDINGS: A total of approximately 5 L of hazy yellow fluid was removed. Samples were sent to the laboratory as requested by the clinical team. IMPRESSION: Successful ultrasound-guided paracentesis yielding 5 liters of peritoneal fluid. PLAN: If the patient eventually requires >/=2 paracenteses in a 30 day period, candidacy for formal evaluation by the [facility] will be assessed. Read by: Durenda Guthrie, PA-C Electronically Signed   By: Jacqulynn Cadet M.D.   On: 08/11/2022 09:54   US ABDOMEN LIMITED RUQ (LIVER/GB)  Result Date: 07/30/2022 CLINICAL DATA:  Distension EXAM: ULTRASOUND ABDOMEN LIMITED RIGHT UPPER QUADRANT COMPARISON:  None Available. FINDINGS: Gallbladder: Diffuse gallbladder wall thickening up to 8 mm. No gallstones or biliary sludge visualized. No sonographic Murphy sign noted by sonographer. Common bile duct: Diameter: 4 mm Liver: Heterogeneous liver echotexture with nodular surface contour. No liver lesion is identified sonographically. Portal vein is patent on color Doppler imaging with normal direction of blood flow towards the liver. Other: Large volume ascites. IMPRESSION: 1. Cirrhotic morphology of the liver. No liver lesion is identified sonographically. 2. Large volume ascites. 3. Diffuse gallbladder wall thickening, favored to be secondary to liver disease. Electronically Signed   By: Davina Poke D.O.   On: 07/21/2022 15:23   MR BRAIN WO CONTRAST  Result Date: 08/01/2022 CLINICAL DATA:  Bilateral lower extremity weakness, imbalance, multiple falls, slurred speech, cognitive decline, and weight loss. History of subdural hematoma. EXAM: MRI HEAD WITHOUT CONTRAST TECHNIQUE: Multiplanar, multiecho pulse sequences of the brain and surrounding structures were obtained without intravenous contrast. COMPARISON:  Head CT 10/10/2020 FINDINGS: Brain: There is no  evidence of an acute infarct, mass, midline shift, or  extra-axial fluid collection. A small subdural fluid collection over the right parietal convexity follows CSF signal intensity and measures up to 5 mm in thickness, unchanged from the prior CT. There is no significant associated mass effect. T2 hyperintensities in the cerebral white matter and pons are nonspecific but compatible with chronic small vessel ischemic disease which is moderate for age. Cerebral atrophy is mild-to-moderate for age. Vascular: Major intracranial vascular flow voids are preserved. Skull and upper cervical spine: No suspicious calvarial lesion. Diminished bone marrow T1 signal intensity throughout the upper cervical spine which is nonspecific but can be seen with anemia, smoking, and obesity. Sinuses/Orbits: Unremarkable orbits. Clear paranasal sinuses. Trace left mastoid fluid. Other: None. IMPRESSION: 1. No acute intracranial abnormality. 2. Unchanged small right-sided subdural hygroma or chronic hematoma without mass effect. 3. Age advanced chronic small vessel ischemic disease and cerebral atrophy. Electronically Signed   By: Logan Bores M.D.   On: 08/01/2022 12:20    Microbiology: Recent Results (from the past 240 hour(s))  Culture, blood (Routine X 2) w Reflex to ID Panel     Status: None   Collection Time: 07/22/2022  2:25 PM   Specimen: BLOOD  Result Value Ref Range Status   Specimen Description BLOOD LEFT ANTECUBITAL  Final   Special Requests   Final    BOTTLES DRAWN AEROBIC AND ANAEROBIC Blood Culture adequate volume   Culture   Final    NO GROWTH 5 DAYS Performed at [facility], [address] [place], [place] [address]    Report Status 2022-09-02 FINAL  Final  Culture, blood (Routine X 2) w Reflex to ID Panel     Status: None   Collection Time: 08/08/2022  2:27 PM   Specimen: BLOOD  Result Value Ref Range Status   Specimen Description BLOOD RIGHT ANTECUBITAL  Final   Special Requests   Final    BOTTLES DRAWN AEROBIC AND ANAEROBIC Blood Culture  adequate volume   Culture   Final    NO GROWTH 5 DAYS Performed at [facility], [address], [place], [place] [address]    Report Status 2022-09-02 FINAL  Final  Body fluid culture w Gram Stain     Status: None   Collection Time: 08/09/2022  4:59 PM   Specimen: Peritoneal Washings; Body Fluid  Result Value Ref Range Status   Specimen Description   Final    PERITONEAL Performed at [facility], [address] [place], [place] [address]    Special Requests   Final    PERITONEAL Performed at [facility], [address] [place], [place] [address]    Gram Stain   Final    RARE WBC PRESENT, PREDOMINANTLY PMN NO ORGANISMS SEEN    Culture   Final    NO GROWTH 3 DAYS Performed at [facility], [place] [address], [place],  [address]    Report Status 08/13/2022 FINAL  Final  MRSA Next Gen by PCR, Nasal     Status: None   Collection Time: 07/29/2022  9:57 PM   Specimen: Nasal Mucosa; Nasal Swab  Result Value Ref Range Status   MRSA by PCR Next Gen NOT DETECTED NOT DETECTED Final    Comment: (NOTE) The GeneXpert MRSA Assay (FDA approved for NASAL specimens only), is one component of a comprehensive MRSA colonization surveillance program. It is not intended to diagnose MRSA infection nor to guide or monitor treatment for  MRSA infections. Test performance is not FDA approved in patients less than 74 years old. Performed at [facility], [address], [place], [place] [address]   Body fluid culture w Gram Stain     Status: None   Collection Time: 08/11/22  8:45 AM   Specimen: PATH Cytology Peritoneal fluid  Result Value Ref Range Status   Specimen Description   Final    PERITONEAL Performed at [facility], [address] [place], [place] [address]    Special Requests   Final    NONE Performed at [facility], [address] [place], [place] [address]    Gram Stain   Final    WBC PRESENT, PREDOMINANTLY  PMN NO ORGANISMS SEEN CYTOSPIN SMEAR    Culture   Final    NO GROWTH 3 DAYS Performed at [facility], [place] [address] [place], [place] [address]    Report Status 08/26/2022 FINAL  Final   Time spent: 25 minutes   Signed: Raiford Noble, DO [facility]  08-26-22

## 2022-08-16 DEATH — deceased

## 2022-08-27 ENCOUNTER — Telehealth: Payer: Medicare Other

## 2022-08-28 ENCOUNTER — Other Ambulatory Visit: Payer: Self-pay | Admitting: Family Medicine

## 2022-08-28 DIAGNOSIS — F411 Generalized anxiety disorder: Secondary | ICD-10-CM

## 2022-10-27 ENCOUNTER — Other Ambulatory Visit: Payer: Self-pay | Admitting: Family Medicine

## 2022-10-27 DIAGNOSIS — E639 Nutritional deficiency, unspecified: Secondary | ICD-10-CM

## 2022-10-27 DIAGNOSIS — G4700 Insomnia, unspecified: Secondary | ICD-10-CM

## 2022-10-27 DIAGNOSIS — R630 Anorexia: Secondary | ICD-10-CM

## 2022-10-29 ENCOUNTER — Ambulatory Visit: Admit: 2022-10-29 | Payer: Medicare Other

## 2022-10-29 SURGERY — COLONOSCOPY WITH PROPOFOL
Anesthesia: General

## 2022-12-07 IMAGING — CT CT HEAD W/O CM
5 of 6 series · 17 of 47 positions shown, 18 images · non-contrast
Comparison: None.

CLINICAL DATA: Recent fall with headaches, initial encounter

EXAM:
CT HEAD WITHOUT CONTRAST
CT CERVICAL SPINE WITHOUT CONTRAST
TECHNIQUE: Multidetector CT imaging of the head and cervical spine was
performed following the standard protocol without intravenous
contrast. Multiplanar CT image reconstructions of the cervical spine
were also generated.

[Series 2: head wo · axial · 0.45mm/px · z∈[-91,-46]mm · 2 of 29 slices shown, 3 images (1 of 2)]
[im 10/29  brain]
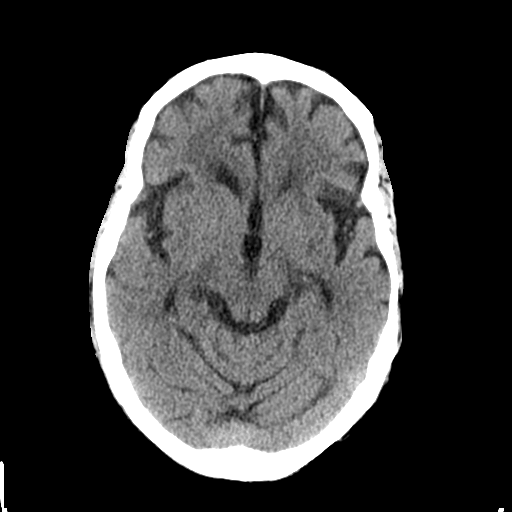
[im 10/29  bone]
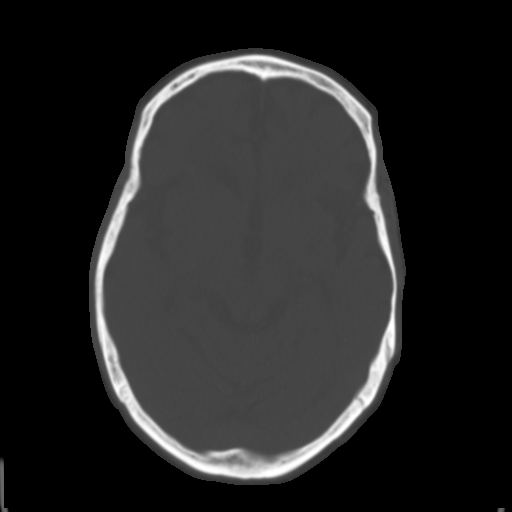
[im 19/29  brain]
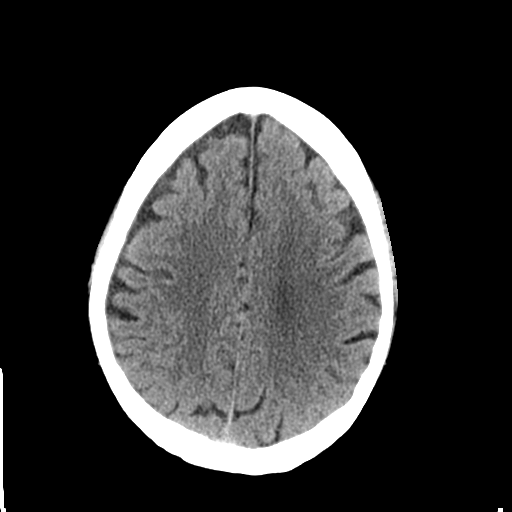

[Series 3: head bone · axial · 0.45mm/px · z∈[-122,-22]mm · 7 of 73 slices shown]
[im 8/73  bone]
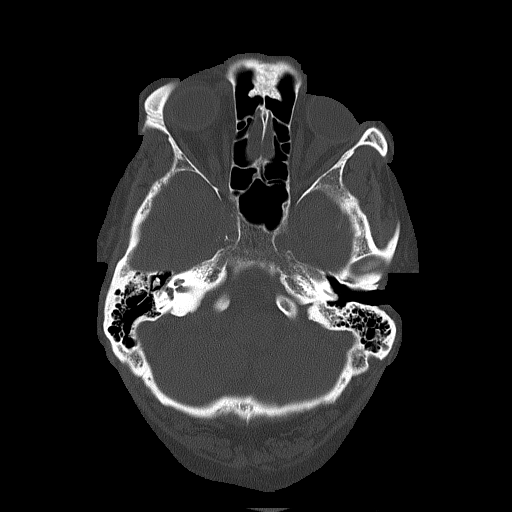
[im 15/73  bone]
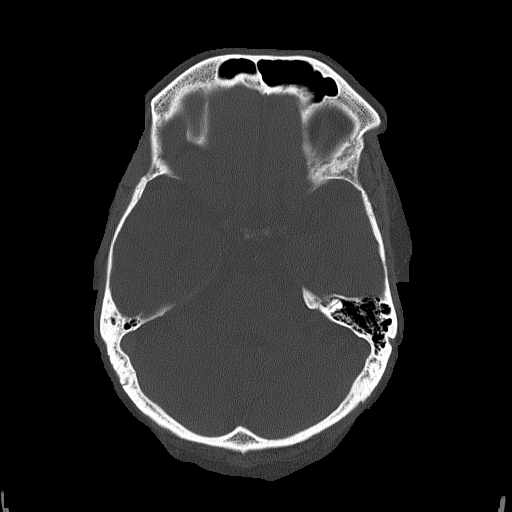
[im 22/73  bone]
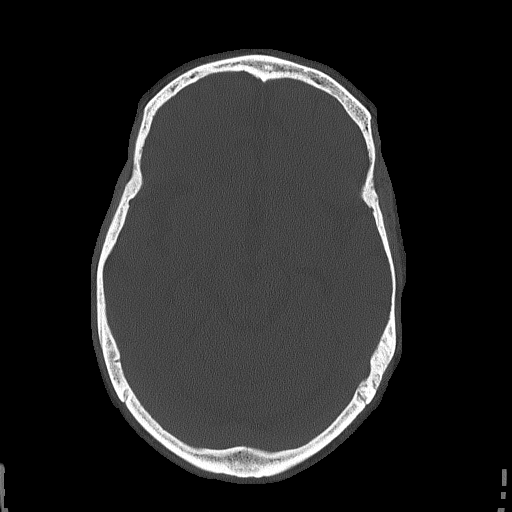
[im 29/73  bone]
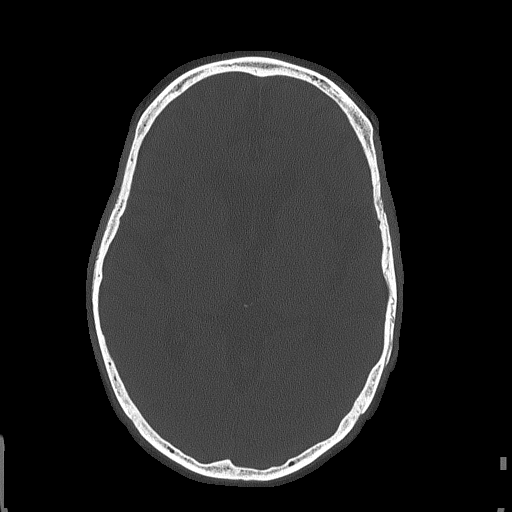
[im 44/73  bone]
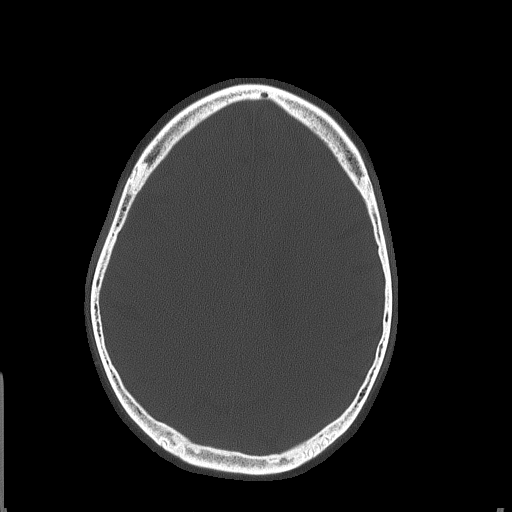
[im 51/73  bone]
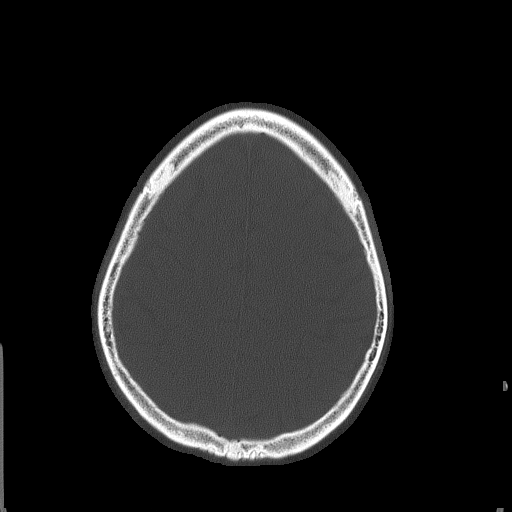
[im 58/73  bone]
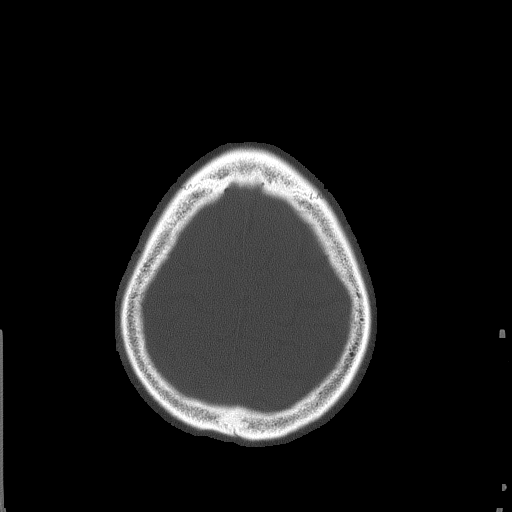

[Series 4: coronal soft tissue · coronal · 0.34mm/px · 3 of 67 slices shown]
[im 23/67  brain]
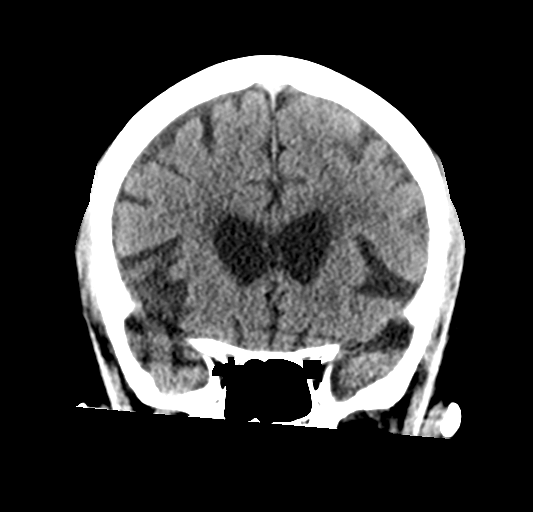
[im 30/67  brain]
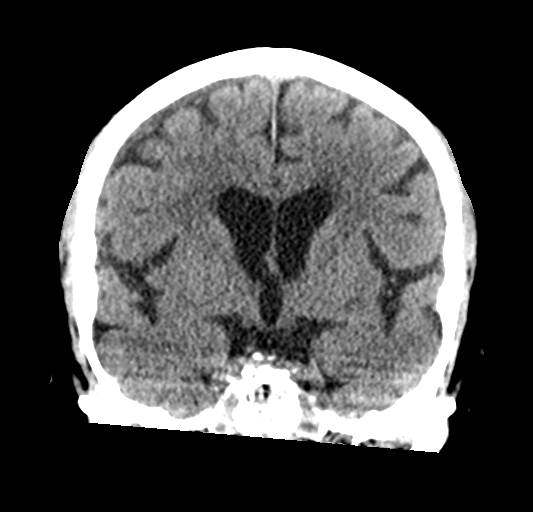
[im 37/67  brain]
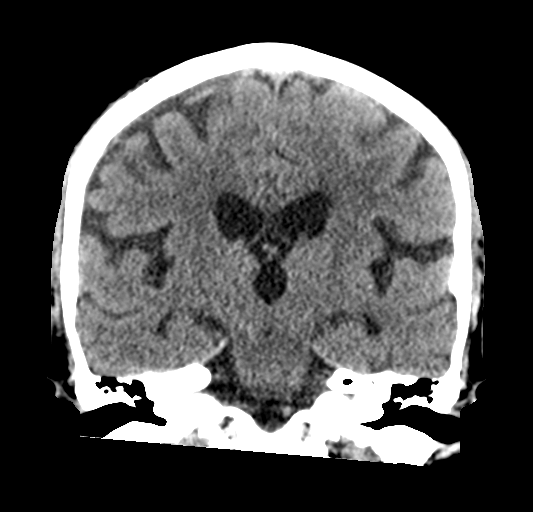

[Series 5: sagittal soft tissue · sagittal · 0.34mm/px · 3 of 55 slices shown]
[im 19/55  brain]
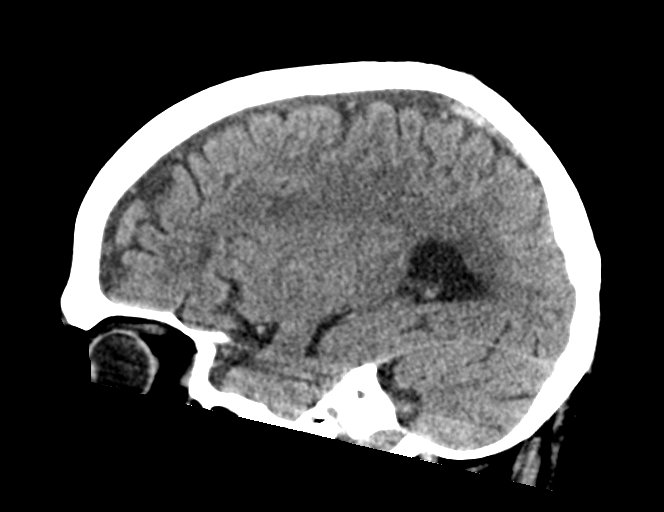
[im 28/55  brain]
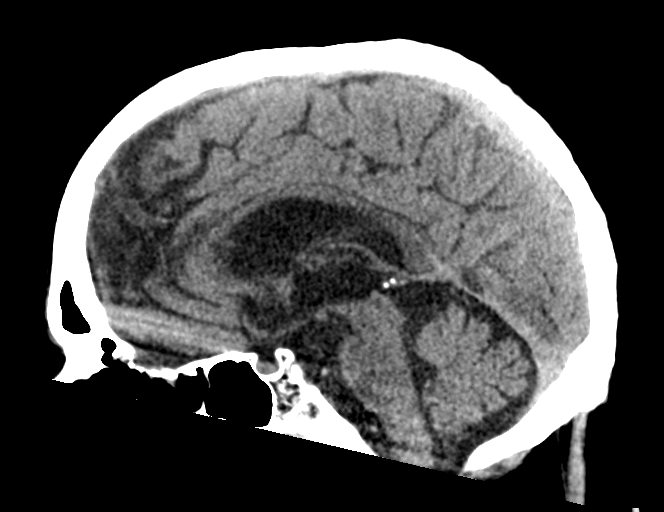
[im 37/55  brain]
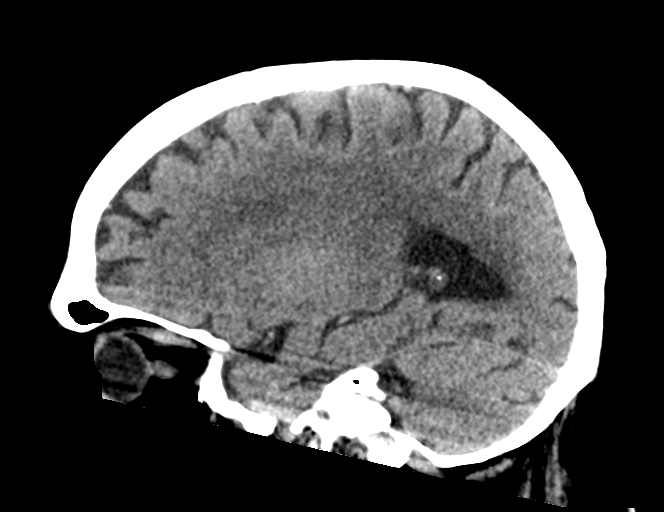

[Series 6: head wo · axial · 0.45mm/px · z∈[-91,-46]mm · 2 of 29 slices shown (2 of 2)]
[im 10/29  brain]
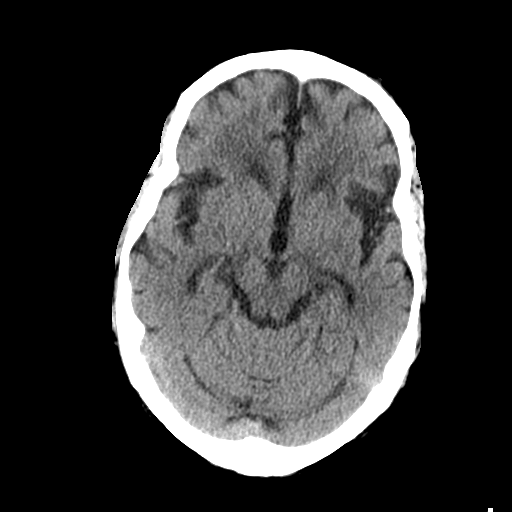
[im 19/29  brain]
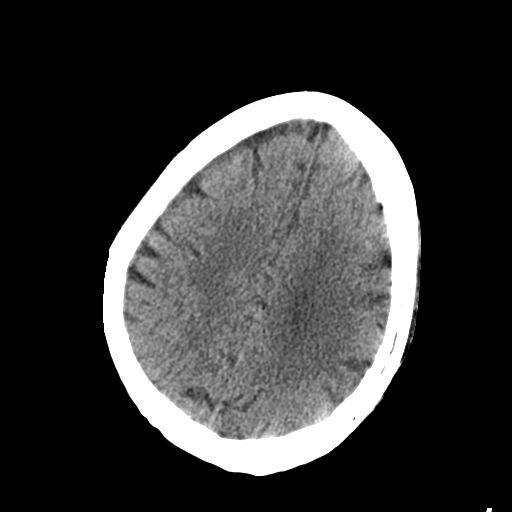

[17 of 47 positions shown; findings below may reference images not displayed]

FINDINGS: CT HEAD FINDINGS

Brain: Small subdural hematoma is noted on the right near the vertex
in the posterior parietal region. This measures approximately 4 mm
and shows no significant mass effect due to mild underlying atrophy.
No acute infarct or space-occupying mass lesion are seen.

Vascular: No hyperdense vessel or unexpected calcification.

Skull: Normal. Negative for fracture or focal lesion.

Sinuses/Orbits: No acute finding.

Other: Air is noted in the scalp on the right related to the recent
injury this is in the general region of the recent injury.

CT CERVICAL SPINE FINDINGS

Alignment: Within normal limits.

Skull base and vertebrae: 7 cervical segments are well visualized.
Vertebral body height is well maintained. Disc space narrowing is
noted at C5-6 and C6-7 with mild associated osteophytic changes.
Mild facet hypertrophic changes are seen. No acute fracture or acute
facet abnormality is noted.

Soft tissues and spinal canal: Surrounding soft tissue structures
are within normal limits.

Upper chest: Visualized lung apices are within normal limits.

Other: None
IMPRESSION: CT of the head: Small 4 mm thick right subdural hematoma along the
vertex in the posterior parietal region.

Mild atrophic changes.

CT of the cervical spine: Degenerative change without acute
abnormality.

Critical Value/emergent results were called by telephone at the time
of interpretation on 09/06/2020 at [DATE] to Dr. TC CEM MINSIN
, who verbally acknowledged these results.
